# Patient Record
Sex: Female | Born: 1980 | Race: Black or African American | Hispanic: No | Marital: Married
Health system: Southern US, Community
[De-identification: ages and names within clinical notes are randomized; demographics above are authoritative.]

## PROBLEM LIST (undated history)

## (undated) ENCOUNTER — Inpatient Hospital Stay (HOSPITAL_COMMUNITY): Payer: Self-pay

## (undated) DIAGNOSIS — F329 Major depressive disorder, single episode, unspecified: Secondary | ICD-10-CM

## (undated) DIAGNOSIS — F1210 Cannabis abuse, uncomplicated: Secondary | ICD-10-CM

## (undated) DIAGNOSIS — J45909 Unspecified asthma, uncomplicated: Secondary | ICD-10-CM

## (undated) DIAGNOSIS — I10 Essential (primary) hypertension: Secondary | ICD-10-CM

## (undated) DIAGNOSIS — F1410 Cocaine abuse, uncomplicated: Secondary | ICD-10-CM

## (undated) DIAGNOSIS — O99340 Other mental disorders complicating pregnancy, unspecified trimester: Secondary | ICD-10-CM

## (undated) DIAGNOSIS — J209 Acute bronchitis, unspecified: Secondary | ICD-10-CM

## (undated) DIAGNOSIS — F32A Depression, unspecified: Secondary | ICD-10-CM

## (undated) HISTORY — PX: APPENDECTOMY: SHX54

## (undated) HISTORY — PX: HAND SURGERY: SHX662

---
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
## 2007-12-06 ENCOUNTER — Emergency Department: Payer: Self-pay | Admitting: Emergency Medicine

## 2007-12-29 ENCOUNTER — Emergency Department (HOSPITAL_COMMUNITY): Admission: EM | Admit: 2007-12-29 | Discharge: 2007-12-29 | Payer: Self-pay | Admitting: Emergency Medicine

## 2008-02-19 ENCOUNTER — Emergency Department (HOSPITAL_COMMUNITY): Admission: EM | Admit: 2008-02-19 | Discharge: 2008-02-19 | Payer: Self-pay | Admitting: Emergency Medicine

## 2008-02-26 ENCOUNTER — Emergency Department (HOSPITAL_COMMUNITY): Admission: EM | Admit: 2008-02-26 | Discharge: 2008-02-26 | Payer: Self-pay | Admitting: Emergency Medicine

## 2009-08-21 ENCOUNTER — Emergency Department (HOSPITAL_COMMUNITY): Admission: EM | Admit: 2009-08-21 | Discharge: 2009-08-21 | Payer: Self-pay | Admitting: Emergency Medicine

## 2009-09-26 ENCOUNTER — Emergency Department (HOSPITAL_COMMUNITY): Admission: EM | Admit: 2009-09-26 | Discharge: 2009-09-26 | Payer: Self-pay | Admitting: Emergency Medicine

## 2009-09-26 ENCOUNTER — Encounter: Payer: Self-pay | Admitting: Family Medicine

## 2009-12-17 ENCOUNTER — Emergency Department (HOSPITAL_COMMUNITY): Admission: EM | Admit: 2009-12-17 | Discharge: 2009-12-17 | Payer: Self-pay | Admitting: Emergency Medicine

## 2010-01-12 ENCOUNTER — Emergency Department (HOSPITAL_COMMUNITY)
Admission: EM | Admit: 2010-01-12 | Discharge: 2010-01-13 | Payer: Self-pay | Source: Home / Self Care | Admitting: Emergency Medicine

## 2010-04-01 ENCOUNTER — Emergency Department (HOSPITAL_COMMUNITY)
Admission: EM | Admit: 2010-04-01 | Discharge: 2010-04-01 | Disposition: A | Discharge status: Home / Self Care | Payer: Self-pay | Attending: Emergency Medicine | Admitting: Emergency Medicine

## 2010-04-01 DIAGNOSIS — J45909 Unspecified asthma, uncomplicated: Secondary | ICD-10-CM | POA: Insufficient documentation

## 2010-04-01 DIAGNOSIS — H5789 Other specified disorders of eye and adnexa: Secondary | ICD-10-CM | POA: Insufficient documentation

## 2010-04-01 DIAGNOSIS — H538 Other visual disturbances: Secondary | ICD-10-CM | POA: Insufficient documentation

## 2010-04-01 DIAGNOSIS — H109 Unspecified conjunctivitis: Secondary | ICD-10-CM | POA: Insufficient documentation

## 2010-04-17 LAB — POCT CARDIAC MARKERS
CKMB, poc: <1 ng/mL — ABNORMAL LOW (ref 1.0–8.0)
CKMB, poc: <1 ng/mL — ABNORMAL LOW (ref 1.0–8.0)
Myoglobin, poc: 39.8 ng/mL (ref 12–200)

## 2010-04-17 LAB — URINE MICROSCOPIC-ADD ON

## 2010-04-17 LAB — URINALYSIS, ROUTINE W REFLEX MICROSCOPIC
Glucose, UA: NEGATIVE mg/dL
Hgb urine dipstick: NEGATIVE
Ketones, ur: 15 mg/dL — AB
Nitrite: NEGATIVE
Protein, ur: 30 mg/dL — AB
Urobilinogen, UA: 0.2 mg/dL (ref 0.0–1.0)

## 2010-04-17 LAB — RAPID URINE DRUG SCREEN, HOSP PERFORMED: Tetrahydrocannabinol: POSITIVE — AB

## 2010-04-17 LAB — CBC
Hemoglobin: 11.3 g/dL — ABNORMAL LOW (ref 12.0–15.0)
MCH: 30.7 pg (ref 26.0–34.0)
MCHC: 32.4 g/dL (ref 30.0–36.0)
Platelets: 233 10*3/uL (ref 150–400)
RDW: 13.1 % (ref 11.5–15.5)

## 2010-04-17 LAB — DIFFERENTIAL
Basophils Absolute: 0 10*3/uL (ref 0.0–0.1)
Basophils Relative: 0 % (ref 0–1)
Eosinophils Absolute: 0.1 10*3/uL (ref 0.0–0.7)
Eosinophils Relative: 1 % (ref 0–5)
Monocytes Absolute: 0.4 10*3/uL (ref 0.1–1.0)

## 2010-04-17 LAB — BASIC METABOLIC PANEL
Calcium: 8.8 mg/dL (ref 8.4–10.5)
Chloride: 108 mEq/L (ref 96–112)
GFR calc Af Amer: >60 mL/min (ref >60)
GFR calc non Af Amer: >60 mL/min (ref >60)
Glucose, Bld: 147 mg/dL — ABNORMAL HIGH (ref 70–99)
Potassium: 3.4 mEq/L — ABNORMAL LOW (ref 3.5–5.1)
Sodium: 140 mEq/L (ref 135–145)

## 2010-04-17 LAB — POCT PREGNANCY, URINE: Preg Test, Ur: NEGATIVE

## 2010-04-17 LAB — PREGNANCY, URINE: Preg Test, Ur: NEGATIVE

## 2010-04-20 LAB — CBC
HCT: 36.1 % (ref 36.0–46.0)
Hemoglobin: 12.1 g/dL (ref 12.0–15.0)
MCH: 32 pg (ref 26.0–34.0)
MCHC: 33.7 g/dL (ref 30.0–36.0)
RDW: 12.8 % (ref 11.5–15.5)

## 2010-04-20 LAB — RAPID URINE DRUG SCREEN, HOSP PERFORMED
Benzodiazepines: NOT DETECTED
Opiates: NOT DETECTED

## 2010-04-20 LAB — URINALYSIS, ROUTINE W REFLEX MICROSCOPIC
Bilirubin Urine: NEGATIVE
Hgb urine dipstick: NEGATIVE
Ketones, ur: NEGATIVE mg/dL
Nitrite: NEGATIVE
Protein, ur: NEGATIVE mg/dL
Urobilinogen, UA: 0.2 mg/dL (ref 0.0–1.0)

## 2010-04-20 LAB — ABO/RH: ABO/RH(D): O POS

## 2010-05-21 LAB — DIFFERENTIAL
Basophils Absolute: 0.1 10*3/uL (ref 0.0–0.1)
Basophils Relative: 2 % — ABNORMAL HIGH (ref 0–1)
Eosinophils Absolute: 0.2 10*3/uL (ref 0.0–0.7)
Neutro Abs: 5 10*3/uL (ref 1.7–7.7)
Neutrophils Relative %: 63 % (ref 43–77)

## 2010-05-21 LAB — PREGNANCY, URINE: Preg Test, Ur: NEGATIVE

## 2010-05-21 LAB — URINALYSIS, ROUTINE W REFLEX MICROSCOPIC
Bilirubin Urine: NEGATIVE
Ketones, ur: 15 mg/dL — AB
Nitrite: NEGATIVE
Protein, ur: NEGATIVE mg/dL

## 2010-05-21 LAB — CBC
HCT: 37.6 % (ref 36.0–46.0)
Hemoglobin: 12.5 g/dL (ref 12.0–15.0)
Platelets: 199 10*3/uL (ref 150–400)
RBC: 3.9 MIL/uL (ref 3.87–5.11)

## 2010-05-21 LAB — COMPREHENSIVE METABOLIC PANEL
ALT: 13 U/L (ref 0–35)
Alkaline Phosphatase: 50 U/L (ref 39–117)
BUN: 8 mg/dL (ref 6–23)
CO2: 27 mEq/L (ref 19–32)
Chloride: 107 mEq/L (ref 96–112)
GFR calc non Af Amer: >60 mL/min (ref >60)
Glucose, Bld: 80 mg/dL (ref 70–99)
Potassium: 3.4 mEq/L — ABNORMAL LOW (ref 3.5–5.1)
Total Bilirubin: 0.3 mg/dL (ref 0.3–1.2)
Total Protein: 6.6 g/dL (ref 6.0–8.3)

## 2010-05-21 LAB — URINE MICROSCOPIC-ADD ON

## 2010-05-21 LAB — RAPID URINE DRUG SCREEN, HOSP PERFORMED
Amphetamines: NOT DETECTED
Cocaine: POSITIVE — AB
Opiates: NOT DETECTED
Tetrahydrocannabinol: POSITIVE — AB

## 2010-05-21 LAB — TYPE AND SCREEN
ABO/RH(D): O POS
Antibody Screen: NEGATIVE

## 2010-05-21 LAB — HCG, QUANTITATIVE, PREGNANCY: hCG, Beta Chain, Quant, S: <2 m[IU]/mL (ref <5)

## 2010-05-21 LAB — POCT PREGNANCY, URINE: Preg Test, Ur: NEGATIVE

## 2010-05-21 LAB — ABO/RH: ABO/RH(D): O POS

## 2010-05-21 LAB — LIPASE, BLOOD: Lipase: 19 U/L (ref 11–59)

## 2010-05-28 ENCOUNTER — Emergency Department (HOSPITAL_COMMUNITY)
Admission: EM | Admit: 2010-05-28 | Discharge: 2010-05-28 | Disposition: A | Discharge status: Home / Self Care | Payer: Self-pay | Attending: Emergency Medicine | Admitting: Emergency Medicine

## 2010-05-28 DIAGNOSIS — R5381 Other malaise: Secondary | ICD-10-CM | POA: Insufficient documentation

## 2010-05-28 DIAGNOSIS — J9801 Acute bronchospasm: Secondary | ICD-10-CM | POA: Insufficient documentation

## 2010-05-28 DIAGNOSIS — R42 Dizziness and giddiness: Secondary | ICD-10-CM | POA: Insufficient documentation

## 2010-05-28 DIAGNOSIS — F1210 Cannabis abuse, uncomplicated: Secondary | ICD-10-CM | POA: Insufficient documentation

## 2010-05-28 DIAGNOSIS — R5383 Other fatigue: Secondary | ICD-10-CM | POA: Insufficient documentation

## 2010-11-06 LAB — DIFFERENTIAL
Basophils Absolute: 0
Basophils Relative: 1
Eosinophils Absolute: 0.2
Eosinophils Relative: 3
Monocytes Absolute: 0.6
Monocytes Relative: 10

## 2010-11-06 LAB — COMPREHENSIVE METABOLIC PANEL
ALT: 12
Albumin: 3.7
Alkaline Phosphatase: 53
Chloride: 107
Potassium: 3.7
Sodium: 140
Total Bilirubin: 0.7
Total Protein: 6.4

## 2010-11-06 LAB — CBC
HCT: 36.5
Hemoglobin: 12.1
Platelets: 228
RDW: 13.4
WBC: 6.7

## 2011-01-27 ENCOUNTER — Emergency Department (HOSPITAL_COMMUNITY)
Admission: EM | Admit: 2011-01-27 | Discharge: 2011-01-28 | Disposition: A | Discharge status: Home / Self Care | Payer: Self-pay | Attending: Emergency Medicine | Admitting: Emergency Medicine

## 2011-01-27 ENCOUNTER — Encounter: Payer: Self-pay | Admitting: *Deleted

## 2011-01-27 DIAGNOSIS — R059 Cough, unspecified: Secondary | ICD-10-CM | POA: Insufficient documentation

## 2011-01-27 DIAGNOSIS — R220 Localized swelling, mass and lump, head: Secondary | ICD-10-CM | POA: Insufficient documentation

## 2011-01-27 DIAGNOSIS — J40 Bronchitis, not specified as acute or chronic: Secondary | ICD-10-CM | POA: Insufficient documentation

## 2011-01-27 DIAGNOSIS — R05 Cough: Secondary | ICD-10-CM

## 2011-01-27 DIAGNOSIS — L03818 Cellulitis of other sites: Secondary | ICD-10-CM | POA: Insufficient documentation

## 2011-01-27 DIAGNOSIS — K602 Anal fissure, unspecified: Secondary | ICD-10-CM | POA: Insufficient documentation

## 2011-01-27 DIAGNOSIS — L02818 Cutaneous abscess of other sites: Secondary | ICD-10-CM | POA: Insufficient documentation

## 2011-01-27 DIAGNOSIS — K921 Melena: Secondary | ICD-10-CM | POA: Insufficient documentation

## 2011-01-27 DIAGNOSIS — K644 Residual hemorrhoidal skin tags: Secondary | ICD-10-CM | POA: Insufficient documentation

## 2011-01-27 DIAGNOSIS — L03811 Cellulitis of head [any part, except face]: Secondary | ICD-10-CM

## 2011-01-27 DIAGNOSIS — K625 Hemorrhage of anus and rectum: Secondary | ICD-10-CM | POA: Insufficient documentation

## 2011-01-27 HISTORY — DX: Acute bronchitis, unspecified: J20.9

## 2011-01-27 NOTE — ED Notes (Signed)
C/o abd pain, rectal bleeding, also coughing up blood & mucus. Also HA ( has a knot on L parietal head), also vision blurry. Describes bleeding as bright red. Alert, NAd calm interactive, speaking in clear complete sentences.  (denies nvd). Also reports intermittant fever.

## 2011-01-27 NOTE — ED Notes (Signed)
Hemoccult card  at bedside 

## 2011-01-27 NOTE — ED Notes (Addendum)
Pt states she think she could be pregnant.  LMP 11 December 2010 and took a pregnancy test and said it was positive.
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
## 2011-01-28 ENCOUNTER — Emergency Department (HOSPITAL_COMMUNITY): Payer: Self-pay

## 2011-01-28 LAB — DIFFERENTIAL
Basophils Relative: 0 % (ref 0–1)
Eosinophils Absolute: 0.2 10*3/uL (ref 0.0–0.7)
Eosinophils Relative: 2 % (ref 0–5)
Lymphs Abs: 3.1 10*3/uL (ref 0.7–4.0)

## 2011-01-28 LAB — URINALYSIS, ROUTINE W REFLEX MICROSCOPIC
Bilirubin Urine: NEGATIVE
Hgb urine dipstick: NEGATIVE
Protein, ur: NEGATIVE mg/dL
Urobilinogen, UA: 1 mg/dL (ref 0.0–1.0)

## 2011-01-28 LAB — CBC
MCH: 31.6 pg (ref 26.0–34.0)
MCHC: 33.1 g/dL (ref 30.0–36.0)
MCV: 95.3 fL (ref 78.0–100.0)
Platelets: 218 10*3/uL (ref 150–400)
RBC: 3.61 MIL/uL — ABNORMAL LOW (ref 3.87–5.11)
RDW: 13.1 % (ref 11.5–15.5)

## 2011-01-28 LAB — POCT PREGNANCY, URINE: Preg Test, Ur: NEGATIVE

## 2011-01-28 LAB — BASIC METABOLIC PANEL
Calcium: 8.9 mg/dL (ref 8.4–10.5)
GFR calc Af Amer: >90 mL/min (ref >90)
GFR calc non Af Amer: 81 mL/min — ABNORMAL LOW (ref >90)
Glucose, Bld: 97 mg/dL (ref 70–99)
Sodium: 139 mEq/L (ref 135–145)

## 2011-01-28 MED ORDER — ALBUTEROL SULFATE HFA 108 (90 BASE) MCG/ACT IN AERS
2.0000 | INHALATION_SPRAY | RESPIRATORY_TRACT | Status: AC
  Administered 2011-01-28: 2 via RESPIRATORY_TRACT
  Filled 2011-01-28: qty 6.7

## 2011-01-28 MED ORDER — DOXYCYCLINE HYCLATE 100 MG PO TABS
100.0000 mg | ORAL_TABLET | Freq: Once | ORAL | Status: AC
  Administered 2011-01-28: 100 mg via ORAL
  Filled 2011-01-28: qty 1

## 2011-01-28 MED ORDER — DOXYCYCLINE HYCLATE 100 MG PO TABS: 100.0000 mg | ORAL_TABLET | Freq: Two times a day (BID) | ORAL | Status: AC

## 2011-01-28 NOTE — ED Provider Notes (Signed)
History     CSN: [id number]  Arrival date & time 01/27/11  2136      Chief Complaint  Patient presents with  . Cough  . Rectal Bleeding   HPI Pt was seen at 0025.  Per pt, c/o gradual onset and persistence of constant multiple complaints for the past 5 days.  Pt states she passed a "hard" BM and saw blood on her stool several days ago.  States she has had a cough with "red streaks" in her sputum, has run out of her MDI and is requesting another.  Pt also c/o "small bump" on her left parietal scalp for the past week.  Pt is also requesting a pregnancy test due to her menses being "late" this month.  Denies fevers, no SOB/CP, no abd pain, no N/V/D, no back/flank pain, no dysuria, no vaginal bleeding/discharge.     Past Medical History  Diagnosis Date  . Bronchitis, acute     Past Surgical History  Procedure Date  . Appendectomy   . Hand surgery     Family History  Problem Relation Age of Onset  . Cancer Other     History  Substance Use Topics  . Smoking status: Current Everyday Smoker -- 1.0 packs/day  . Smokeless tobacco: Not on file  . Alcohol Use: Yes     occaisional   Review of Systems ROS: Statement: All systems negative except as marked or noted in the HPI; Constitutional: Negative for fever and chills. ; ; Eyes: Negative for eye pain, redness and discharge. ; ; ENMT: Negative for ear pain, hoarseness, nasal congestion, sinus pressure and sore throat. ; ; Cardiovascular: Negative for chest pain, palpitations, diaphoresis, dyspnea and peripheral edema. ; ; Respiratory: +cough. Negative for wheezing and stridor. ; ; Gastrointestinal: Negative for nausea, vomiting, diarrhea, abdominal pain, blood in stool, hematemesis, jaundice and +rectal bleeding. . ; ; Genitourinary: Negative for dysuria, flank pain and hematuria. ; ; Musculoskeletal: Negative for back pain and neck pain. Negative for swelling and trauma.; ; Skin: Negative for pruritus, rash, abrasions, blisters, bruising  and +skin lesion.; ; Neuro: Negative for headache, lightheadedness and neck stiffness. Negative for weakness, altered level of consciousness , altered mental status, extremity weakness, paresthesias, involuntary movement, seizure and syncope.    Allergies  Penicillins  Home Medications  No current outpatient prescriptions on file.  BP 123/73  Pulse 74  Temp(Src) 98.3 F (36.8 C) (Oral)  Resp 18  SpO2 100%  LMP 12/11/2010  Physical Exam 0030: Physical examination:  Nursing notes reviewed; Vital signs and O2 SAT reviewed;  Constitutional: Well developed, Well nourished, Well hydrated, In no acute distress; Head:  Normocephalic, atraumatic, +left parietal scalp with <0.5cm area of induration, erythema and TTP, no central pointing area/drainage/open wound; Eyes: EOMI, PERRL, No scleral icterus; ENMT: Mouth and pharynx normal, Mucous membranes moist; Neck: Supple, Full range of motion, No lymphadenopathy; Cardiovascular: Regular rate and rhythm, No murmur, rub, or gallop; Respiratory: Breath sounds clear & equal bilaterally, No rales, rhonchi, wheezes, or rub, Normal respiratory effort/excursion; Chest: Nontender, Movement normal; Abdomen: Soft, Nontender, Nondistended, Normal bowel sounds; Rectal exam performed w/permission of pt and ED RN chaparone present.  Anal tone normal.  +small anal fissure and external hemorrhoid that are TTP, hemorrhoid is without thrombosis or active bleeding, no palp masses. Genitourinary: No CVA tenderness;  Extremities: Pulses normal, No tenderness, No edema, No calf edema or asymmetry.; Neuro: AA&Ox3, Major CN grossly intact. Speech clear, no facial droop, gait steady. No gross focal  motor or sensory deficits in extremities.; Skin: Color normal, Warm, Dry, no rash.   ED Course  Procedures    MDM  MDM Reviewed: nursing note and vitals Interpretation: labs and x-ray   Results for orders placed during the hospital encounter of 01/27/11  CBC      Component Value  Range   WBC 7.2  4.0 - 10.5 (K/uL)   RBC 3.61 (*) 3.87 - 5.11 (MIL/uL)   Hemoglobin 11.4 (*) 12.0 - 15.0 (g/dL)   HCT 40.9 (*) 81.1 - 46.0 (%)   MCV 95.3  78.0 - 100.0 (fL)   MCH 31.6  26.0 - 34.0 (pg)   MCHC 33.1  30.0 - 36.0 (g/dL)   RDW 91.4  78.2 - 95.6 (%)   Platelets 218  150 - 400 (K/uL)  DIFFERENTIAL      Component Value Range   Neutrophils Relative 47  43 - 77 (%)   Neutro Abs 3.4  1.7 - 7.7 (K/uL)   Lymphocytes Relative 43  12 - 46 (%)   Lymphs Abs 3.1  0.7 - 4.0 (K/uL)   Monocytes Relative 7  3 - 12 (%)   Monocytes Absolute 0.5  0.1 - 1.0 (K/uL)   Eosinophils Relative 2  0 - 5 (%)   Eosinophils Absolute 0.2  0.0 - 0.7 (K/uL)   Basophils Relative 0  0 - 1 (%)   Basophils Absolute 0.0  0.0 - 0.1 (K/uL)  BASIC METABOLIC PANEL      Component Value Range   Sodium 139  135 - 145 (mEq/L)   Potassium 3.8  3.5 - 5.1 (mEq/L)   Chloride 105  96 - 112 (mEq/L)   CO2 26  19 - 32 (mEq/L)   Glucose, Bld 97  70 - 99 (mg/dL)   BUN 19  6 - 23 (mg/dL)   Creatinine, Ser 2.13  0.50 - 1.10 (mg/dL)   Calcium 8.9  8.4 - 08.6 (mg/dL)   GFR calc non Af Amer 81 (*) >90 (mL/min)   GFR calc Af Amer >90  >90 (mL/min)  URINALYSIS, ROUTINE W REFLEX MICROSCOPIC      Component Value Range   Color, Urine YELLOW  YELLOW    APPearance CLEAR  CLEAR    Specific Gravity, Urine 1.021  1.005 - 1.030    pH 7.0  5.0 - 8.0    Glucose, UA NEGATIVE  NEGATIVE (mg/dL)   Hgb urine dipstick NEGATIVE  NEGATIVE    Bilirubin Urine NEGATIVE  NEGATIVE    Ketones, ur NEGATIVE  NEGATIVE (mg/dL)   Protein, ur NEGATIVE  NEGATIVE (mg/dL)   Urobilinogen, UA 1.0  0.0 - 1.0 (mg/dL)   Nitrite NEGATIVE  NEGATIVE    Leukocytes, UA NEGATIVE  NEGATIVE   POCT PREGNANCY, URINE      Component Value Range   Preg Test, Ur NEGATIVE     Results for CHRISTOL, THETFORD (MRN [id number]) as of 01/28/2011 02:17  Ref. Range 01/12/2010 20:54 01/27/2011 23:45  HGB Latest Range: 12.0-15.0 g/dL 52.8 (L) 41.3 (L)  HCT Latest Range:  36.0-46.0 % 34.9 (L) 34.4 (L)    Dg Chest 2 View 01/28/2011  *RADIOLOGY REPORT*  Clinical Data: Cough and body aches; abdominal pain.  CHEST - 2 VIEW  Comparison: Chest radiograph performed 01/12/2010  Findings: The lungs are well-aerated and clear.  There is no evidence of focal opacification, pleural effusion or pneumothorax.  The heart is normal in size; the mediastinal contour is within normal limits.  No acute osseous  abnormalities are seen.  IMPRESSION: No acute cardiopulmonary process seen.  Original Report Authenticated By: Tonia Ghent, M.D.      2:16 AM:  Has walked around ED without distress.  VS remain stable.  Wants to go home now.  Dx testing d/w pt and family.  Questions answered.  Verb understanding, agreeable to d/c home with outpt f/u.     Jasman Murri Allison Quarry, DO 01/30/11 [id number]

## 2011-03-09 ENCOUNTER — Emergency Department (HOSPITAL_COMMUNITY): Payer: Self-pay

## 2011-03-09 ENCOUNTER — Encounter (HOSPITAL_COMMUNITY): Payer: Self-pay | Admitting: Emergency Medicine

## 2011-03-09 ENCOUNTER — Emergency Department (HOSPITAL_COMMUNITY)
Admission: EM | Admit: 2011-03-09 | Discharge: 2011-03-09 | Disposition: A | Discharge status: Home / Self Care | Payer: Self-pay | Attending: Emergency Medicine | Admitting: Emergency Medicine

## 2011-03-09 DIAGNOSIS — R630 Anorexia: Secondary | ICD-10-CM | POA: Insufficient documentation

## 2011-03-09 DIAGNOSIS — R10819 Abdominal tenderness, unspecified site: Secondary | ICD-10-CM | POA: Insufficient documentation

## 2011-03-09 DIAGNOSIS — R61 Generalized hyperhidrosis: Secondary | ICD-10-CM | POA: Insufficient documentation

## 2011-03-09 DIAGNOSIS — R102 Pelvic and perineal pain: Secondary | ICD-10-CM

## 2011-03-09 DIAGNOSIS — F17200 Nicotine dependence, unspecified, uncomplicated: Secondary | ICD-10-CM | POA: Insufficient documentation

## 2011-03-09 DIAGNOSIS — N949 Unspecified condition associated with female genital organs and menstrual cycle: Secondary | ICD-10-CM | POA: Insufficient documentation

## 2011-03-09 DIAGNOSIS — Z0000 Encounter for general adult medical examination without abnormal findings: Secondary | ICD-10-CM

## 2011-03-09 DIAGNOSIS — R42 Dizziness and giddiness: Secondary | ICD-10-CM | POA: Insufficient documentation

## 2011-03-09 DIAGNOSIS — R3919 Other difficulties with micturition: Secondary | ICD-10-CM | POA: Insufficient documentation

## 2011-03-09 DIAGNOSIS — N898 Other specified noninflammatory disorders of vagina: Secondary | ICD-10-CM | POA: Insufficient documentation

## 2011-03-09 DIAGNOSIS — R112 Nausea with vomiting, unspecified: Secondary | ICD-10-CM | POA: Insufficient documentation

## 2011-03-09 DIAGNOSIS — R109 Unspecified abdominal pain: Secondary | ICD-10-CM | POA: Insufficient documentation

## 2011-03-09 DIAGNOSIS — R5381 Other malaise: Secondary | ICD-10-CM | POA: Insufficient documentation

## 2011-03-09 DIAGNOSIS — R5383 Other fatigue: Secondary | ICD-10-CM | POA: Insufficient documentation

## 2011-03-09 LAB — URINALYSIS, ROUTINE W REFLEX MICROSCOPIC
Glucose, UA: NEGATIVE mg/dL
Hgb urine dipstick: NEGATIVE
Leukocytes, UA: NEGATIVE
pH: 5.5 (ref 5.0–8.0)

## 2011-03-09 LAB — DIFFERENTIAL
Basophils Absolute: 0 10*3/uL (ref 0.0–0.1)
Basophils Relative: 0 % (ref 0–1)
Eosinophils Absolute: 0.2 10*3/uL (ref 0.0–0.7)
Monocytes Relative: 9 % (ref 3–12)
Neutro Abs: 3.7 10*3/uL (ref 1.7–7.7)
Neutrophils Relative %: 55 % (ref 43–77)

## 2011-03-09 LAB — COMPREHENSIVE METABOLIC PANEL
AST: 16 U/L (ref 0–37)
Albumin: 3.7 g/dL (ref 3.5–5.2)
Alkaline Phosphatase: 53 U/L (ref 39–117)
BUN: 8 mg/dL (ref 6–23)
Chloride: 105 mEq/L (ref 96–112)
Potassium: 3.5 mEq/L (ref 3.5–5.1)
Total Bilirubin: 0.4 mg/dL (ref 0.3–1.2)
Total Protein: 7.2 g/dL (ref 6.0–8.3)

## 2011-03-09 LAB — WET PREP, GENITAL
Trich, Wet Prep: NONE SEEN
WBC, Wet Prep HPF POC: NONE SEEN

## 2011-03-09 LAB — LIPASE, BLOOD: Lipase: 19 U/L (ref 11–59)

## 2011-03-09 LAB — CBC
MCH: 31.6 pg (ref 26.0–34.0)
MCHC: 33.5 g/dL (ref 30.0–36.0)
Platelets: 207 10*3/uL (ref 150–400)
RDW: 12.5 % (ref 11.5–15.5)

## 2011-03-09 MED ORDER — ONDANSETRON HCL 4 MG/2ML IJ SOLN
4.0000 mg | Freq: Once | INTRAMUSCULAR | Status: AC
  Administered 2011-03-09: 4 mg via INTRAVENOUS
  Filled 2011-03-09: qty 2

## 2011-03-09 MED ORDER — FENTANYL CITRATE 0.05 MG/ML IJ SOLN
100.0000 ug | Freq: Once | INTRAMUSCULAR | Status: AC
  Administered 2011-03-09: 100 ug via INTRAVENOUS
  Filled 2011-03-09: qty 2

## 2011-03-09 NOTE — ED Notes (Signed)
Reports lmp was 3 months ago, thinks she is having a miscarriage due to abd cramping and passing large clots this am and now feeling dizzy. No acute distress noted at triage.

## 2011-03-09 NOTE — ED Provider Notes (Signed)
History     CSN: [id number]  Arrival date & time 03/09/11  1502   First MD Initiated Contact with Patient 03/09/11 1539      Chief Complaint  Patient presents with  . Vaginal Bleeding    (Consider location/radiation/quality/duration/timing/severity/associated sxs/prior treatment) HPI Comments: Patient presents with multiple complaints including heavy vaginal bleeding that started today. Patient states that her last menstrual period was in October. Patient was seen in the emergency department on 01/27/2011 and had a negative urine pregnancy test at that time. Patient states that she has used several pads over the past several hours. Along with this the patient complains of abdominal pain, mostly in the upper quadrants and on the sides of her abdomen. She is not having any pelvic pain. Patient states that she feels dizzy and lightheaded and that she had a syncopal episode a couple hours ago. She states that she has vomited 4 times. The vomit was green. Patient states loss of appetite. She has not had dysuria but she also states that she is unable to use the restroom. No treatments prior to arrival. Patient denies using any daily medications. Patient has a history of an appendectomy.  Patient is a 31 y.o. female presenting with vaginal bleeding. The history is provided by the patient.  Vaginal Bleeding This is a new problem. The current episode started today. The problem has been gradually improving. Associated symptoms include abdominal pain, diaphoresis, fatigue, nausea and vomiting. Pertinent negatives include no chest pain, coughing, fever, headaches, myalgias, rash or sore throat. The symptoms are aggravated by nothing. She has tried nothing for the symptoms.    Past Medical History  Diagnosis Date  . Bronchitis, acute     Past Surgical History  Procedure Date  . Appendectomy   . Hand surgery     Family History  Problem Relation Age of Onset  . Cancer Other     History    Substance Use Topics  . Smoking status: Current Everyday Smoker -- 1.0 packs/day  . Smokeless tobacco: Not on file  . Alcohol Use: Yes     occaisional    OB History    Grav Para Term Preterm Abortions TAB SAB Ect Mult Living                  Review of Systems  Constitutional: Positive for diaphoresis and fatigue. Negative for fever.  HENT: Negative for sore throat and rhinorrhea.   Eyes: Negative for redness.  Respiratory: Negative for cough and shortness of breath.   Cardiovascular: Negative for chest pain.  Gastrointestinal: Positive for nausea, vomiting and abdominal pain. Negative for diarrhea and blood in stool.  Genitourinary: Positive for flank pain, vaginal bleeding and difficulty urinating. Negative for dysuria, frequency, hematuria, vaginal discharge and pelvic pain.  Musculoskeletal: Negative for myalgias and back pain.  Skin: Negative for rash.  Neurological: Negative for headaches.    Allergies  Penicillins  Home Medications   Current Outpatient Rx  Name Route Sig Dispense Refill  . ALBUTEROL SULFATE HFA 108 (90 BASE) MCG/ACT IN AERS Inhalation Inhale 2 puffs into the lungs every 6 (six) hours as needed. For shortness of breath      BP 137/85  Pulse 71  Temp(Src) 98.3 F (36.8 C) (Oral)  Resp 18  SpO2 100%  LMP 12/07/2010  Physical Exam  Nursing note and vitals reviewed. Constitutional: She is oriented to person, place, and time. She appears well-developed and well-nourished.  HENT:  Head: Normocephalic and atraumatic.  Right  Ear: External ear normal.  Left Ear: External ear normal.  Nose: Nose normal.  Mouth/Throat: Oropharynx is clear and moist.  Eyes: Conjunctivae are normal. Pupils are equal, round, and reactive to light. Right eye exhibits no discharge. Left eye exhibits no discharge.       Conjunctiva are not pale.   Neck: Normal range of motion. Neck supple.  Cardiovascular: Normal rate, regular rhythm, normal heart sounds and intact  distal pulses.   Pulmonary/Chest: Effort normal and breath sounds normal.  Abdominal: Soft. Bowel sounds are normal. She exhibits no distension. There is tenderness in the right upper quadrant, epigastric area, periumbilical area and left upper quadrant. There is no rebound, no guarding and no CVA tenderness.    Genitourinary: Vagina normal. Pelvic exam was performed with patient supine. There is no rash or lesion on the right labia. There is no rash or lesion on the left labia. Uterus is tender. Cervix exhibits discharge. Right adnexum displays tenderness. Right adnexum displays no mass and no fullness. Left adnexum displays tenderness. Left adnexum displays no mass and no fullness.  Musculoskeletal: Normal range of motion.  Neurological: She is alert and oriented to person, place, and time.  Skin: Skin is warm and dry. No pallor.  Psychiatric: She has a normal mood and affect.    ED Course  Procedures (including critical care time)  Labs Reviewed  COMPREHENSIVE METABOLIC PANEL - Abnormal; Notable for the following:    GFR calc non Af Amer 74 (*)    GFR calc Af Amer 86 (*)    All other components within normal limits  WET PREP, GENITAL - Abnormal; Notable for the following:    Clue Cells Wet Prep HPF POC FEW (*)    All other components within normal limits  CBC  DIFFERENTIAL  URINALYSIS, ROUTINE W REFLEX MICROSCOPIC  LIPASE, BLOOD  POCT PREGNANCY, URINE  GC/CHLAMYDIA PROBE AMP, GENITAL   US Transvaginal Non-ob  03/09/2011  *RADIOLOGY REPORT*  Clinical Data: Question miscarriage.  Pelvic pain.  Bleeding. A urine pregnancy test was negative.  Clear cells are present on the white prep.  TRANSABDOMINAL AND TRANSVAGINAL ULTRASOUND OF PELVIS Technique:  Both transabdominal and transvaginal ultrasound examinations of the pelvis were performed. Transabdominal technique was performed for global imaging of the pelvis including uterus, ovaries, adnexal regions, and pelvic cul-de-sac.  Comparison:  Pelvic ultrasound 09/26/2009.   It was necessary to proceed with endovaginal exam following the transabdominal exam to visualize the uterus and adnexa.  Findings:  Uterus: The uterus is of normal size and echotexture, measuring 7.7 x 3.9 x 4.7 cm.  There is no gestational sac.  Endometrium: Normal.  Maximal thickness is 7.2 mm.  Right ovary:  The right ovary is of normal size and echotexture, measuring 4.5 x 3.0-0.7 cm.  Multiple follicles are evident.  Left ovary: The left ovary is of normal size and echotexture measuring 4.8 x 2.7 x 4.0 cm.  Multiple follicles are evident.  Other findings: No free fluid  IMPRESSION: Normal pelvic ultrasound.  Original Report Authenticated By: Jamesetta Orleans. MATTERN, M.D.   US Pelvis Complete  03/09/2011  *RADIOLOGY REPORT*  Clinical Data: Question miscarriage.  Pelvic pain.  Bleeding. A urine pregnancy test was negative.  Clear cells are present on the white prep.  TRANSABDOMINAL AND TRANSVAGINAL ULTRASOUND OF PELVIS Technique:  Both transabdominal and transvaginal ultrasound examinations of the pelvis were performed. Transabdominal technique was performed for global imaging of the pelvis including uterus, ovaries, adnexal regions, and pelvic cul-de-sac.  Comparison: Pelvic ultrasound 09/26/2009.   It was necessary to proceed with endovaginal exam following the transabdominal exam to visualize the uterus and adnexa.  Findings:  Uterus: The uterus is of normal size and echotexture, measuring 7.7 x 3.9 x 4.7 cm.  There is no gestational sac.  Endometrium: Normal.  Maximal thickness is 7.2 mm.  Right ovary:  The right ovary is of normal size and echotexture, measuring 4.5 x 3.0-0.7 cm.  Multiple follicles are evident.  Left ovary: The left ovary is of normal size and echotexture measuring 4.8 x 2.7 x 4.0 cm.  Multiple follicles are evident.  Other findings: No free fluid  IMPRESSION: Normal pelvic ultrasound.  Original Report Authenticated By: Jamesetta Orleans. MATTERN, M.D.      1. Pelvic pain   2. Normal physical exam     3:42 PM Pt not in room. Previous visit from 01/27/11 reviewed. Neg upreg at that time. \\  4:04 PM Patient seen and examined. Work-up initiated. Medications ordered.   Vital signs reviewed and are as follows: Filed Vitals:   03/09/11 1526  BP: 137/85  Pulse: 71  Temp: 98.3 F (36.8 C)  Resp: 18   Do not suspect ectopic pregnancy or other serious etiology at this time however work-up initiated.   5:22 PM Pelvic exam was performed. No evidence of bleeding. Patient has had inconsistent exam during time in ED. Pelvic pain everywhere on exam. Work-up negative to this point. Pt seen by Dr. Patrica Duel. [place] ordered.    7:51 PM Patient at [place].  9:02 PM Patient was informed of all results. She is urged to follow-up with her GYN and return with worsening. She verbalizes understanding and agrees with plan.    MDM  Patient with cc: vaginal bleeding, no evidence of bleeding on exam. Work-up negative. Exam and tests are essentially normal.         Carolee Rota, [place] 03/09/11 2104

## 2011-03-10 NOTE — ED Provider Notes (Signed)
Medical screening examination/treatment/procedure(s) were performed by non-physician practitioner and as supervising physician I was immediately available for consultation/collaboration.   Klyn Kroening A. Patrica Duel, MD 03/10/11 1108

## 2011-03-18 ENCOUNTER — Emergency Department (HOSPITAL_COMMUNITY)
Admission: EM | Admit: 2011-03-18 | Discharge: 2011-03-19 | Disposition: A | Discharge status: Home / Self Care | Payer: Self-pay | Attending: Emergency Medicine | Admitting: Emergency Medicine

## 2011-03-18 ENCOUNTER — Emergency Department (HOSPITAL_COMMUNITY): Payer: Self-pay

## 2011-03-18 ENCOUNTER — Encounter (HOSPITAL_COMMUNITY): Payer: Self-pay | Admitting: Emergency Medicine

## 2011-03-18 DIAGNOSIS — IMO0002 Reserved for concepts with insufficient information to code with codable children: Secondary | ICD-10-CM | POA: Insufficient documentation

## 2011-03-18 DIAGNOSIS — M79609 Pain in unspecified limb: Secondary | ICD-10-CM | POA: Insufficient documentation

## 2011-03-18 DIAGNOSIS — Y92009 Unspecified place in unspecified non-institutional (private) residence as the place of occurrence of the external cause: Secondary | ICD-10-CM | POA: Insufficient documentation

## 2011-03-18 DIAGNOSIS — S6000XA Contusion of unspecified finger without damage to nail, initial encounter: Secondary | ICD-10-CM | POA: Insufficient documentation

## 2011-03-18 DIAGNOSIS — F17200 Nicotine dependence, unspecified, uncomplicated: Secondary | ICD-10-CM | POA: Insufficient documentation

## 2011-03-18 DIAGNOSIS — R209 Unspecified disturbances of skin sensation: Secondary | ICD-10-CM | POA: Insufficient documentation

## 2011-03-18 MED ORDER — HYDROCODONE-ACETAMINOPHEN 5-325 MG PO TABS
1.0000 | ORAL_TABLET | Freq: Once | ORAL | Status: AC
  Administered 2011-03-18: 1 via ORAL
  Filled 2011-03-18: qty 1
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
## 2011-03-19 MED ORDER — AMOXICILLIN-POT CLAVULANATE 875-125 MG PO TABS: 1.0000 | ORAL_TABLET | Freq: Two times a day (BID) | ORAL | Status: AC

## 2011-03-19 MED ORDER — TETANUS-DIPHTH-ACELL PERTUSSIS 5-2.5-18.5 LF-MCG/0.5 IM SUSP
0.5000 mL | Freq: Once | INTRAMUSCULAR | Status: AC
  Administered 2011-03-19: 0.5 mL via INTRAMUSCULAR
  Filled 2011-03-19: qty 0.5

## 2011-03-19 MED ORDER — HYDROCODONE-ACETAMINOPHEN 5-325 MG PO TABS: 1.0000 | ORAL_TABLET | ORAL | Status: AC | PRN

## 2011-03-19 NOTE — Discharge Instructions (Signed)
Contusion A contusion is a deep bruise. Bruises happen when an injury causes bleeding under the skin. Signs of bruising include pain, puffiness (swelling), and discolored skin. The bruise may turn blue, purple, or yellow. HOME CARE   Rest the injured area until the pain and puffiness are better.   Try to limit use of the injured area as much as possible or as told by your doctor.   Put ice on the injured area.   Put ice in a plastic bag.   Place a towel between your skin and the bag.   Leave the ice on for 15 to 20 minutes, 3 to 4 times a day.   Raise (elevate) the injured area above the level of the heart.   Use an elastic bandage to lessen puffiness and motion.   Only take medicine as told by your doctor.   Eat healthy.   See your doctor for a follow-up visit.  GET HELP RIGHT AWAY IF:   There is more redness, puffiness, or pain.   You have a headache, muscle ache, or you feel dizzy and ill.   You have a fever.   The pain is not controlled with medicine.   The bruise is not getting better.   There is yellowish white fluid (pus) coming from the wound.   You lose feeling (numbness) in the injured area.   The bruised area feels cold.   There are new problems.  MAKE SURE YOU:   Understand these instructions.   Will watch your condition.   Will get help right away if you are not doing well or get worse.  Document Released: 07/10/2007 Document Revised: 10/03/2010 Document Reviewed: 07/10/2007 [employer] Patient Information 2012 [employer]. 
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
## 2011-10-11 ENCOUNTER — Emergency Department (HOSPITAL_COMMUNITY)
Admission: EM | Admit: 2011-10-11 | Discharge: 2011-10-11 | Disposition: A | Discharge status: Home / Self Care | Payer: Self-pay | Attending: Emergency Medicine | Admitting: Emergency Medicine

## 2011-10-11 ENCOUNTER — Emergency Department (HOSPITAL_COMMUNITY): Payer: Self-pay

## 2011-10-11 ENCOUNTER — Encounter (HOSPITAL_COMMUNITY): Payer: Self-pay | Admitting: Family Medicine

## 2011-10-11 DIAGNOSIS — S0990XA Unspecified injury of head, initial encounter: Secondary | ICD-10-CM | POA: Insufficient documentation

## 2011-10-11 DIAGNOSIS — Z9089 Acquired absence of other organs: Secondary | ICD-10-CM | POA: Insufficient documentation

## 2011-10-11 DIAGNOSIS — F17200 Nicotine dependence, unspecified, uncomplicated: Secondary | ICD-10-CM | POA: Insufficient documentation

## 2011-10-11 DIAGNOSIS — Y92410 Unspecified street and highway as the place of occurrence of the external cause: Secondary | ICD-10-CM | POA: Insufficient documentation

## 2011-10-11 MED ORDER — HYDROCODONE-ACETAMINOPHEN 5-325 MG PO TABS
1.0000 | ORAL_TABLET | Freq: Once | ORAL | Status: AC
  Administered 2011-10-11: 1 via ORAL
  Filled 2011-10-11: qty 1

## 2011-10-11 MED ORDER — KETOROLAC TROMETHAMINE 30 MG/ML IJ SOLN: 30.0000 mg | Freq: Once | INTRAMUSCULAR | Status: DC

## 2011-10-11 MED ORDER — HYDROCODONE-ACETAMINOPHEN 5-325 MG PO TABS
ORAL_TABLET | ORAL | Status: AC
  Filled 2011-10-11: qty 1

## 2011-10-11 MED ORDER — KETOROLAC TROMETHAMINE 30 MG/ML IJ SOLN
30.0000 mg | Freq: Once | INTRAMUSCULAR | Status: AC
  Administered 2011-10-11: 30 mg via INTRAMUSCULAR
  Filled 2011-10-11: qty 1
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
## 2012-09-29 ENCOUNTER — Encounter (HOSPITAL_COMMUNITY): Payer: Self-pay | Admitting: Emergency Medicine

## 2012-09-29 ENCOUNTER — Emergency Department (HOSPITAL_COMMUNITY)
Admission: EM | Admit: 2012-09-29 | Discharge: 2012-09-29 | Discharge status: Against Medical Advice | Payer: Self-pay | Attending: Emergency Medicine | Admitting: Emergency Medicine

## 2012-09-29 ENCOUNTER — Emergency Department (HOSPITAL_COMMUNITY): Payer: Self-pay

## 2012-09-29 DIAGNOSIS — Z79899 Other long term (current) drug therapy: Secondary | ICD-10-CM | POA: Insufficient documentation

## 2012-09-29 DIAGNOSIS — N949 Unspecified condition associated with female genital organs and menstrual cycle: Secondary | ICD-10-CM | POA: Insufficient documentation

## 2012-09-29 DIAGNOSIS — N760 Acute vaginitis: Secondary | ICD-10-CM | POA: Insufficient documentation

## 2012-09-29 DIAGNOSIS — O210 Mild hyperemesis gravidarum: Secondary | ICD-10-CM

## 2012-09-29 DIAGNOSIS — B9689 Other specified bacterial agents as the cause of diseases classified elsewhere: Secondary | ICD-10-CM

## 2012-09-29 DIAGNOSIS — Z3201 Encounter for pregnancy test, result positive: Secondary | ICD-10-CM | POA: Insufficient documentation

## 2012-09-29 DIAGNOSIS — O219 Vomiting of pregnancy, unspecified: Secondary | ICD-10-CM | POA: Insufficient documentation

## 2012-09-29 DIAGNOSIS — N72 Inflammatory disease of cervix uteri: Secondary | ICD-10-CM | POA: Insufficient documentation

## 2012-09-29 DIAGNOSIS — F1210 Cannabis abuse, uncomplicated: Secondary | ICD-10-CM | POA: Insufficient documentation

## 2012-09-29 DIAGNOSIS — Z885 Allergy status to narcotic agent status: Secondary | ICD-10-CM | POA: Insufficient documentation

## 2012-09-29 DIAGNOSIS — F17200 Nicotine dependence, unspecified, uncomplicated: Secondary | ICD-10-CM | POA: Insufficient documentation

## 2012-09-29 LAB — URINALYSIS, ROUTINE W REFLEX MICROSCOPIC
Hgb urine dipstick: NEGATIVE
Nitrite: NEGATIVE
Protein, ur: NEGATIVE mg/dL
Specific Gravity, Urine: 1.007 (ref 1.005–1.030)
Urobilinogen, UA: 0.2 mg/dL (ref 0.0–1.0)

## 2012-09-29 LAB — WET PREP, GENITAL

## 2012-09-29 LAB — POCT PREGNANCY, URINE: Preg Test, Ur: POSITIVE — AB

## 2012-09-29 LAB — GC/CHLAMYDIA PROBE AMP: CT Probe RNA: NEGATIVE

## 2012-09-29 MED ORDER — AZITHROMYCIN 1 G PO PACK
1.0000 g | PACK | Freq: Once | ORAL | Status: AC
  Administered 2012-09-29: 1 g via ORAL
  Filled 2012-09-29: qty 1

## 2012-09-29 MED ORDER — CEFTRIAXONE SODIUM 250 MG IJ SOLR
250.0000 mg | Freq: Once | INTRAMUSCULAR | Status: AC
  Administered 2012-09-29: 250 mg via INTRAMUSCULAR
  Filled 2012-09-29: qty 250

## 2012-09-29 NOTE — ED Notes (Signed)
Pt here for abd pain and nausea, recently found out she was [redacted] weeks pregnant. sts marijuana is the only thing that makes her eat but she still vomits. sts that food tastes funny. This is second pregnancy child is 32 years old.

## 2012-09-29 NOTE — ED Notes (Signed)
PT. REPORTS PERSISTENT NAUSEA AND VOMITTING FOR THE PAST SEVERAL DAYS UNRELIEVED BY PRESCRIPTION ZOFRAN , PT. STATED SHE IS [redacted] WEEKS PREGNANT.

## 2012-09-29 NOTE — ED Notes (Signed)
Pt understands risk and benefits of leaving ama, form signed

## 2012-09-29 NOTE — ED Provider Notes (Signed)
CSN: [id number]     Arrival date & time 09/29/12  0205 History   First MD Initiated Contact with Patient 09/29/12 [phone]     Chief Complaint  Patient presents with  . Emesis During Pregnancy   (Consider location/radiation/quality/duration/timing/severity/associated sxs/prior Treatment) Patient is a 32 y.o. female presenting with vomiting. The history is provided by the patient.  Emesis Severity:  Moderate Timing:  Intermittent Number of daily episodes:  3 Quality:  Stomach contents Progression:  Unchanged Chronicity:  New Recent urination:  Normal Relieved by:  Nothing Worsened by:  Nothing tried Ineffective treatments:  None tried Associated symptoms: no arthralgias, no chills, no fever, no headaches and no myalgias   Associated symptoms comment:  Cramping Risk factors: pregnant now   Risk factors: no sick contacts   Risk factors comment:  See at [facility] on the 16th, see attached records and diagnosed with IUP G2P1 see at [facility] on the 16th and diagnosed with IUP and is having persistent n/v despite zofran.  No f/c/r.  No urinary symptoms some discharge  Past Medical History  Diagnosis Date  . Bronchitis, acute    Past Surgical History  Procedure Laterality Date  . Appendectomy    . Hand surgery     Family History  Problem Relation Age of Onset  . Cancer Other    History  Substance Use Topics  . Smoking status: Current Every Day Smoker -- 1.00 packs/day  . Smokeless tobacco: Not on file  . Alcohol Use: Yes     Comment: occaisional   OB History   Grav Para Term Preterm Abortions TAB SAB Ect Mult Living   1              Review of Systems  Constitutional: Negative for chills.  Gastrointestinal: Positive for nausea and vomiting.  Genitourinary: Positive for vaginal discharge and pelvic pain. Negative for vaginal bleeding.  Musculoskeletal: Negative for myalgias and arthralgias.  Neurological: Negative for headaches.  All other systems reviewed and are  negative.    Allergies  Percocet  Home Medications   Current Outpatient Rx  Name  Route  Sig  Dispense  Refill  . albuterol (PROVENTIL HFA;VENTOLIN HFA) 108 (90 BASE) MCG/ACT inhaler   Inhalation   Inhale 2 puffs into the lungs every 6 (six) hours as needed. For shortness of breath         . ondansetron (ZOFRAN) 4 MG tablet   Oral   Take 4 mg by mouth every 8 (eight) hours as needed for nausea.          BP 108/70  Pulse 59  Temp(Src) 99.1 F (37.3 C) (Oral)  SpO2 99%  LMP 09/25/2011 Physical Exam  Constitutional: She is oriented to person, place, and time. She appears well-developed and well-nourished.  HENT:  Head: Normocephalic and atraumatic.  Mouth/Throat: Oropharynx is clear and moist.  Eyes: Conjunctivae are normal. Pupils are equal, round, and reactive to light.  Neck: Normal range of motion. Neck supple.  Cardiovascular: Normal rate, regular rhythm and intact distal pulses.   Pulmonary/Chest: Effort normal and breath sounds normal. She has no wheezes. She has no rales.  Abdominal: Soft. Bowel sounds are normal. There is no tenderness. There is no rebound and no guarding.  Genitourinary: Vaginal discharge found.  cmt os closed white discharge chaperone present  Musculoskeletal: Normal range of motion.  Neurological: She is alert and oriented to person, place, and time.  Skin: Skin is warm and dry.  Psychiatric: She has a normal  mood and affect.    ED Course  Procedures (including critical care time) Labs Review Labs Reviewed  WET PREP, GENITAL - Abnormal; Notable for the following:    Yeast Wet Prep HPF POC FEW (*)    Clue Cells Wet Prep HPF POC MODERATE (*)    WBC, Wet Prep HPF POC FEW (*)    All other components within normal limits  URINALYSIS, ROUTINE W REFLEX MICROSCOPIC - Abnormal; Notable for the following:    APPearance HAZY (*)    Ketones, ur 15 (*)    All other components within normal limits  POCT PREGNANCY, URINE - Abnormal; Notable for  the following:    Preg Test, Ur POSITIVE (*)    All other components within normal limits  GC/CHLAMYDIA PROBE AMP  HCG, QUANTITATIVE, PREGNANCY   Imaging Review No results found.  MDM  See attached records normal CBC and patient is O positive.  Treated for GC/Chlamydia.  Will repeat quant.  Patient must stop using marijuana.  Will change to phenergan suppositories.     Case d/w faculty practice attending on call treat BV in first trimester   Patient then left AMA during the work up  Anginette Espejo Smitty Cords, MD 09/29/12 [phone]

## 2012-09-29 NOTE — ED Notes (Signed)
Patient transported to Ultrasound 

## 2013-12-06 ENCOUNTER — Encounter (HOSPITAL_COMMUNITY): Payer: Self-pay | Admitting: Emergency Medicine

## 2013-12-11 ENCOUNTER — Inpatient Hospital Stay (HOSPITAL_COMMUNITY)
Admission: AD | Admit: 2013-12-11 | Discharge: 2013-12-12 | Disposition: A | Discharge status: Home / Self Care | Payer: Medicaid Other | Source: Ambulatory Visit | Attending: Obstetrics and Gynecology | Admitting: Obstetrics and Gynecology

## 2013-12-11 ENCOUNTER — Encounter (HOSPITAL_COMMUNITY): Payer: Self-pay

## 2013-12-11 DIAGNOSIS — S3991XA Unspecified injury of abdomen, initial encounter: Secondary | ICD-10-CM | POA: Insufficient documentation

## 2013-12-11 DIAGNOSIS — O99323 Drug use complicating pregnancy, third trimester: Secondary | ICD-10-CM | POA: Insufficient documentation

## 2013-12-11 DIAGNOSIS — O0932 Supervision of pregnancy with insufficient antenatal care, second trimester: Secondary | ICD-10-CM

## 2013-12-11 DIAGNOSIS — Z3A27 27 weeks gestation of pregnancy: Secondary | ICD-10-CM | POA: Insufficient documentation

## 2013-12-11 DIAGNOSIS — R109 Unspecified abdominal pain: Secondary | ICD-10-CM | POA: Diagnosis not present

## 2013-12-11 DIAGNOSIS — Z3A36 36 weeks gestation of pregnancy: Secondary | ICD-10-CM | POA: Diagnosis not present

## 2013-12-11 DIAGNOSIS — F1990 Other psychoactive substance use, unspecified, uncomplicated: Secondary | ICD-10-CM | POA: Insufficient documentation

## 2013-12-11 DIAGNOSIS — O26893 Other specified pregnancy related conditions, third trimester: Secondary | ICD-10-CM | POA: Diagnosis not present

## 2013-12-11 DIAGNOSIS — O0930 Supervision of pregnancy with insufficient antenatal care, unspecified trimester: Secondary | ICD-10-CM | POA: Insufficient documentation

## 2013-12-11 HISTORY — DX: Unspecified asthma, uncomplicated: J45.909

## 2013-12-11 HISTORY — DX: Cannabis abuse, uncomplicated: F12.10

## 2013-12-11 HISTORY — DX: Essential (primary) hypertension: I10

## 2013-12-11 HISTORY — DX: Cocaine abuse, uncomplicated: F14.10

## 2013-12-11 LAB — URINALYSIS, ROUTINE W REFLEX MICROSCOPIC
BILIRUBIN URINE: NEGATIVE
Glucose, UA: NEGATIVE mg/dL
HGB URINE DIPSTICK: NEGATIVE
KETONES UR: 15 mg/dL — AB
Leukocytes, UA: NEGATIVE
NITRITE: NEGATIVE
PH: 6 (ref 5.0–8.0)
Protein, ur: 100 mg/dL — AB
Specific Gravity, Urine: >1.03 — ABNORMAL HIGH (ref 1.005–1.030)
Urobilinogen, UA: 0.2 mg/dL (ref 0.0–1.0)

## 2013-12-11 LAB — RAPID URINE DRUG SCREEN, HOSP PERFORMED
AMPHETAMINES: NOT DETECTED
BARBITURATES: NOT DETECTED
BENZODIAZEPINES: NOT DETECTED
Cocaine: POSITIVE — AB
Opiates: NOT DETECTED
TETRAHYDROCANNABINOL: POSITIVE — AB

## 2013-12-11 LAB — ETHANOL

## 2013-12-11 LAB — URINE MICROSCOPIC-ADD ON
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
## 2013-12-11 NOTE — MAU Note (Signed)
Informed pt of plan of care. Blood work drawn. Informed patient we need urine sample; pt states she wants us to "leave her alone for a while"; having difficulty keeping patient aroused in order to discuss her treatment and get information.

## 2013-12-12 ENCOUNTER — Inpatient Hospital Stay (HOSPITAL_COMMUNITY): Payer: Medicaid Other

## 2013-12-12 ENCOUNTER — Encounter (HOSPITAL_COMMUNITY): Payer: Self-pay

## 2013-12-12 DIAGNOSIS — S3991XA Unspecified injury of abdomen, initial encounter: Secondary | ICD-10-CM

## 2013-12-12 DIAGNOSIS — F1210 Cannabis abuse, uncomplicated: Secondary | ICD-10-CM

## 2013-12-12 DIAGNOSIS — O99322 Drug use complicating pregnancy, second trimester: Secondary | ICD-10-CM

## 2013-12-12 DIAGNOSIS — F1420 Cocaine dependence, uncomplicated: Secondary | ICD-10-CM

## 2013-12-12 DIAGNOSIS — Z3A27 27 weeks gestation of pregnancy: Secondary | ICD-10-CM

## 2013-12-12 NOTE — Discharge Instructions (Signed)
Preterm Labor Information Preterm labor is when labor starts at less than 37 weeks of pregnancy. The normal length of a pregnancy is 39 to 41 weeks. CAUSES Often, there is no identifiable underlying cause as to why a woman goes into preterm labor. One of the most common known causes of preterm labor is infection. Infections of the uterus, cervix, vagina, amniotic sac, bladder, kidney, or even the lungs (pneumonia) can cause labor to start. Other suspected causes of preterm labor include:   Urogenital infections, such as yeast infections and bacterial vaginosis.   Uterine abnormalities (uterine shape, uterine septum, fibroids, or bleeding from the placenta).   A cervix that has been operated on (it may fail to stay closed).   Malformations in the fetus.   Multiple gestations (twins, triplets, and so on).   Breakage of the amniotic sac.  RISK FACTORS 1. Having a previous history of preterm labor.  2. Having premature rupture of membranes (PROM).  3. Having a placenta that covers the opening of the cervix (placenta previa).  4. Having a placenta that separates from the uterus (placental abruption).  5. Having a cervix that is too weak to hold the fetus in the uterus (incompetent cervix).  6. Having too much fluid in the amniotic sac (polyhydramnios).  7. Taking illegal drugs or smoking while pregnant.  8. Not gaining enough weight while pregnant.  9. Being younger than 18 and older than 31 years old.  10. Having a low socioeconomic status.  11. Being African American. SYMPTOMS Signs and symptoms of preterm labor include:   Menstrual-like cramps, abdominal pain, or back pain.  Uterine contractions that are regular, as frequent as six in an hour, regardless of their intensity (may be mild or painful).  Contractions that start on the top of the uterus and spread down to the lower abdomen and back.   A sense of increased pelvic pressure.   A watery or bloody mucus  discharge that comes from the vagina.  TREATMENT Depending on the length of the pregnancy and other circumstances, your health care provider may suggest bed rest. If necessary, there are medicines that can be given to stop contractions and to mature the fetal lungs. If labor happens before 34 weeks of pregnancy, a prolonged hospital stay may be recommended. Treatment depends on the condition of both you and the fetus.  WHAT SHOULD YOU DO IF YOU THINK YOU ARE IN PRETERM LABOR? Call your health care provider right away. You will need to go to the hospital to get checked immediately. HOW CAN YOU PREVENT PRETERM LABOR IN FUTURE PREGNANCIES? You should:   Stop smoking if you smoke.  Maintain healthy weight gain and avoid chemicals and drugs that are not necessary.  Be watchful for any type of infection.  Inform your health care provider if you have a known history of preterm labor. Document Released: 04/13/2003 Document Revised: 09/23/2012 Document Reviewed: 02/24/2012 ExitCare Patient Information 2015 [employer]. This information is not intended to replace advice given to you by your health care provider. Make sure you discuss any questions you have with your health care provider.  Fetal Movement Counts Patient Name: __________________________________________________ Patient Due Date: ____________________ Performing a fetal movement count is highly recommended in high-risk pregnancies, but it is good for every pregnant woman to do. Your health care provider may ask you to start counting fetal movements at 28 weeks of the pregnancy. Fetal movements often increase:  After eating a full meal.  After physical activity.    After eating or drinking something sweet or cold.  At rest. Pay attention to when you feel the baby is most active. This will help you notice a pattern of your baby's sleep and wake cycles and what factors contribute to an increase in fetal movement. It is important to  perform a fetal movement count at the same time each day when your baby is normally most active.  HOW TO COUNT FETAL MOVEMENTS 12. Find a quiet and comfortable area to sit or lie down on your left side. Lying on your left side provides the best blood and oxygen circulation to your baby. 13. Write down the day and time on a sheet of paper or in a journal. 14. Start counting kicks, flutters, swishes, rolls, or jabs in a 2-hour period. You should feel at least 10 movements within 2 hours. 15. If you do not feel 10 movements in 2 hours, wait 2-3 hours and count again. Look for a change in the pattern or not enough counts in 2 hours. SEEK MEDICAL CARE IF:  You feel less than 10 counts in 2 hours, tried twice.  There is no movement in over an hour.  The pattern is changing or taking longer each day to reach 10 counts in 2 hours.  You feel the baby is not moving as he or she usually does. Date: ____________ Movements: ____________ Start time: ____________ Finish time: ____________  Date: ____________ Movements: ____________ Start time: ____________ Finish time: ____________ Date: ____________ Movements: ____________ Start time: ____________ Finish time: ____________ Date: ____________ Movements: ____________ Start time: ____________ Finish time: ____________ Date: ____________ Movements: ____________ Start time: ____________ Finish time: ____________ Date: ____________ Movements: ____________ Start time: ____________ Finish time: ____________ Date: ____________ Movements: ____________ Start time: ____________ Finish time: ____________ Date: ____________ Movements: ____________ Start time: ____________ Finish time: ____________  Date: ____________ Movements: ____________ Start time: ____________ Finish time: ____________ Date: ____________ Movements: ____________ Start time: ____________ Finish time: ____________ Date: ____________ Movements: ____________ Start time: ____________ Finish time:  ____________ Date: ____________ Movements: ____________ Start time: ____________ Finish time: ____________ Date: ____________ Movements: ____________ Start time: ____________ Finish time: ____________ Date: ____________ Movements: ____________ Start time: ____________ Finish time: ____________ Date: ____________ Movements: ____________ Start time: ____________ Finish time: ____________  Date: ____________ Movements: ____________ Start time: ____________ Finish time: ____________ Date: ____________ Movements: ____________ Start time: ____________ Finish time: ____________ Date: ____________ Movements: ____________ Start time: ____________ Finish time: ____________ Date: ____________ Movements: ____________ Start time: ____________ Finish time: ____________ Date: ____________ Movements: ____________ Start time: ____________ Finish time: ____________ Date: ____________ Movements: ____________ Start time: ____________ Finish time: ____________ Date: ____________ Movements: ____________ Start time: ____________ Finish time: ____________  Date: ____________ Movements: ____________ Start time: ____________ Finish time: ____________ Date: ____________ Movements: ____________ Start time: ____________ Finish time: ____________ Date: ____________ Movements: ____________ Start time: ____________ Finish time: ____________ Date: ____________ Movements: ____________ Start time: ____________ Finish time: ____________ Date: ____________ Movements: ____________ Start time: ____________ Finish time: ____________ Date: ____________ Movements: ____________ Start time: ____________ Finish time: ____________ Date: ____________ Movements: ____________ Start time: ____________ Finish time: ____________  Date: ____________ Movements: ____________ Start time: ____________ Finish time: ____________ Date: ____________ Movements: ____________ Start time: ____________ Finish time: ____________ Date: ____________ Movements:  ____________ Start time: ____________ Finish time: ____________ Date: ____________ Movements: ____________ Start time: ____________ Finish time: ____________ Date: ____________ Movements: ____________ Start time: ____________ Finish time: ____________ Date: ____________ Movements: ____________ Start time: ____________ Finish time: ____________ Date: ____________ Movements: ____________ Start time: ____________ Finish time: ____________    Date: ____________ Movements: ____________ Start time: ____________ Finish time: ____________ Date: ____________ Movements: ____________ Start time: ____________ Finish time: ____________ Date: ____________ Movements: ____________ Start time: ____________ Finish time: ____________ Date: ____________ Movements: ____________ Start time: ____________ Finish time: ____________ Date: ____________ Movements: ____________ Start time: ____________ Finish time: ____________ Date: ____________ Movements: ____________ Start time: ____________ Finish time: ____________ Date: ____________ Movements: ____________ Start time: ____________ Finish time: ____________  Date: ____________ Movements: ____________ Start time: ____________ Finish time: ____________ Date: ____________ Movements: ____________ Start time: ____________ Finish time: ____________ Date: ____________ Movements: ____________ Start time: ____________ Finish time: ____________ Date: ____________ Movements: ____________ Start time: ____________ Finish time: ____________ Date: ____________ Movements: ____________ Start time: ____________ Finish time: ____________ Date: ____________ Movements: ____________ Start time: ____________ Finish time: ____________ Date: ____________ Movements: ____________ Start time: ____________ Finish time: ____________  Date: ____________ Movements: ____________ Start time: ____________ Finish time: ____________ Date: ____________ Movements: ____________ Start time: ____________ Finish  time: ____________ Date: ____________ Movements: ____________ Start time: ____________ Finish time: ____________ Date: ____________ Movements: ____________ Start time: ____________ Finish time: ____________ Date: ____________ Movements: ____________ Start time: ____________ Finish time: ____________ Date: ____________ Movements: ____________ Start time: ____________ Finish time: ____________ Document Released: 02/20/2006 Document Revised: 06/07/2013 Document Reviewed: 11/18/2011 ExitCare Patient Information 2015 [employer]. This information is not intended to replace advice given to you by your health care provider. Make sure you discuss any questions you have with your health care provider.  
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
## 2013-12-13 DIAGNOSIS — O0930 Supervision of pregnancy with insufficient antenatal care, unspecified trimester: Secondary | ICD-10-CM | POA: Insufficient documentation

## 2013-12-13 DIAGNOSIS — F1990 Other psychoactive substance use, unspecified, uncomplicated: Secondary | ICD-10-CM | POA: Insufficient documentation

## 2013-12-13 DIAGNOSIS — S3991XA Unspecified injury of abdomen, initial encounter: Secondary | ICD-10-CM | POA: Insufficient documentation

## 2013-12-13 DIAGNOSIS — Z3A27 27 weeks gestation of pregnancy: Secondary | ICD-10-CM | POA: Insufficient documentation

## 2014-02-04 NOTE — L&D Delivery Note (Signed)
Delivery Note At 5:34 AM a viable female was delivered via Vaginal, Spontaneous Delivery (Presentation: Left Occiput Anterior) immediately prior to CNM arriving in room. Pt thought her water broke, but baby's head came out at the same time. Pt called RN. Baby's head was out when RN arrived in room. She delivered rest of baby.  APGAR: 7, 8; weight 7 lb 0.7 oz (3195 g).   Placenta status: Intact, Spontaneous.  Cord: 3 vessels with the following complications: 5x10 cm area of dark, adherent clot on maternal surface. Possible abruption.  Cord pH: NA  Anesthesia: Epidural  Episiotomy: None Lacerations: None Suture Repair: NA Est. Blood Loss (mL): 350  Mom to postpartum.  Baby to Couplet care / Skin to Skin. Placenta to: BS Feeding: Bottle Circ: ?OP Contraception: Desires BTL, but doesn't have medicaid.  Sign BTL consent. SW consult.  Dorathy KinsmanSMITH, Johnanna Bakke 02/23/2014, 9:32 AM

## 2014-02-22 ENCOUNTER — Encounter (HOSPITAL_COMMUNITY): Payer: Self-pay | Admitting: General Practice

## 2014-02-22 ENCOUNTER — Inpatient Hospital Stay (HOSPITAL_COMMUNITY)
Admission: AD | Admit: 2014-02-22 | Discharge: 2014-02-25 | DRG: 775 | Disposition: A | Discharge status: Home / Self Care | Payer: Medicaid Other | Source: Ambulatory Visit | Attending: Obstetrics and Gynecology | Admitting: Obstetrics and Gynecology

## 2014-02-22 ENCOUNTER — Inpatient Hospital Stay (HOSPITAL_COMMUNITY): Payer: Medicaid Other

## 2014-02-22 DIAGNOSIS — O0933 Supervision of pregnancy with insufficient antenatal care, third trimester: Secondary | ICD-10-CM

## 2014-02-22 DIAGNOSIS — F1410 Cocaine abuse, uncomplicated: Secondary | ICD-10-CM | POA: Diagnosis present

## 2014-02-22 DIAGNOSIS — O99324 Drug use complicating childbirth: Secondary | ICD-10-CM | POA: Diagnosis present

## 2014-02-22 DIAGNOSIS — J45909 Unspecified asthma, uncomplicated: Secondary | ICD-10-CM | POA: Diagnosis present

## 2014-02-22 DIAGNOSIS — O99334 Smoking (tobacco) complicating childbirth: Secondary | ICD-10-CM | POA: Diagnosis present

## 2014-02-22 DIAGNOSIS — F1910 Other psychoactive substance abuse, uncomplicated: Secondary | ICD-10-CM

## 2014-02-22 DIAGNOSIS — Z3A37 37 weeks gestation of pregnancy: Secondary | ICD-10-CM | POA: Diagnosis present

## 2014-02-22 DIAGNOSIS — F1210 Cannabis abuse, uncomplicated: Secondary | ICD-10-CM | POA: Diagnosis present

## 2014-02-22 DIAGNOSIS — Z885 Allergy status to narcotic agent status: Secondary | ICD-10-CM | POA: Diagnosis not present

## 2014-02-22 DIAGNOSIS — Z880 Allergy status to penicillin: Secondary | ICD-10-CM

## 2014-02-22 DIAGNOSIS — O288 Other abnormal findings on antenatal screening of mother: Secondary | ICD-10-CM | POA: Insufficient documentation

## 2014-02-22 DIAGNOSIS — F17210 Nicotine dependence, cigarettes, uncomplicated: Secondary | ICD-10-CM | POA: Diagnosis present

## 2014-02-22 DIAGNOSIS — O99824 Streptococcus B carrier state complicating childbirth: Secondary | ICD-10-CM | POA: Diagnosis present

## 2014-02-22 DIAGNOSIS — IMO0002 Reserved for concepts with insufficient information to code with codable children: Secondary | ICD-10-CM | POA: Diagnosis present

## 2014-02-22 LAB — DIFFERENTIAL
Basophils Absolute: 0 10*3/uL (ref 0.0–0.1)
Basophils Relative: 0 % (ref 0–1)
EOS PCT: 1 % (ref 0–5)
Eosinophils Absolute: 0 10*3/uL (ref 0.0–0.7)
LYMPHS PCT: 29 % (ref 12–46)
Lymphs Abs: 1.7 10*3/uL (ref 0.7–4.0)
MONOS PCT: 6 % (ref 3–12)
Monocytes Absolute: 0.3 10*3/uL (ref 0.1–1.0)
NEUTROS PCT: 64 % (ref 43–77)
Neutro Abs: 3.9 10*3/uL (ref 1.7–7.7)

## 2014-02-22 LAB — TYPE AND SCREEN
ABO/RH(D): O POS
ANTIBODY SCREEN: NEGATIVE

## 2014-02-22 LAB — RAPID URINE DRUG SCREEN, HOSP PERFORMED
AMPHETAMINES: NOT DETECTED
BARBITURATES: NOT DETECTED
Benzodiazepines: NOT DETECTED
COCAINE: POSITIVE — AB
Opiates: NOT DETECTED
TETRAHYDROCANNABINOL: NOT DETECTED

## 2014-02-22 LAB — URINALYSIS, ROUTINE W REFLEX MICROSCOPIC
Bilirubin Urine: NEGATIVE
Glucose, UA: NEGATIVE mg/dL
HGB URINE DIPSTICK: NEGATIVE
Ketones, ur: 15 mg/dL — AB
Leukocytes, UA: NEGATIVE
NITRITE: NEGATIVE
PROTEIN: NEGATIVE mg/dL
Urobilinogen, UA: 0.2 mg/dL (ref 0.0–1.0)
pH: 6 (ref 5.0–8.0)

## 2014-02-22 LAB — HEPATITIS B SURFACE ANTIGEN: HEP B S AG: NEGATIVE

## 2014-02-22 LAB — OB RESULTS CONSOLE GBS: GBS: NEGATIVE

## 2014-02-22 LAB — CBC
HCT: 33.1 % — ABNORMAL LOW (ref 36.0–46.0)
Hemoglobin: 11 g/dL — ABNORMAL LOW (ref 12.0–15.0)
MCH: 31 pg (ref 26.0–34.0)
MCHC: 33.2 g/dL (ref 30.0–36.0)
MCV: 93.2 fL (ref 78.0–100.0)
Platelets: 186 10*3/uL (ref 150–400)
RBC: 3.55 MIL/uL — ABNORMAL LOW (ref 3.87–5.11)
RDW: 13 % (ref 11.5–15.5)
WBC: 5.9 10*3/uL (ref 4.0–10.5)

## 2014-02-22 LAB — WET PREP, GENITAL
Clue Cells Wet Prep HPF POC: NONE SEEN
Trich, Wet Prep: NONE SEEN
YEAST WET PREP: NONE SEEN

## 2014-02-22 LAB — GROUP B STREP BY PCR: GROUP B STREP BY PCR: NEGATIVE

## 2014-02-22 MED ORDER — OXYTOCIN BOLUS FROM INFUSION: 500.0000 mL | INTRAVENOUS | Status: DC

## 2014-02-22 MED ORDER — LIDOCAINE HCL (PF) 1 % IJ SOLN: 30.0000 mL | INTRAMUSCULAR | Status: DC | PRN

## 2014-02-22 MED ORDER — LACTATED RINGERS IV SOLN
INTRAVENOUS | Status: DC
  Administered 2014-02-23: 04:00:00 via INTRAVENOUS

## 2014-02-22 MED ORDER — OXYTOCIN 40 UNITS IN LACTATED RINGERS INFUSION - SIMPLE MED
1.0000 m[IU]/min | INTRAVENOUS | Status: DC
  Filled 2014-02-22: qty 1000

## 2014-02-22 MED ORDER — ALBUTEROL SULFATE (2.5 MG/3ML) 0.083% IN NEBU
3.0000 mL | INHALATION_SOLUTION | RESPIRATORY_TRACT | Status: DC | PRN
  Administered 2014-02-22: 3 mL via RESPIRATORY_TRACT
  Filled 2014-02-22: qty 3

## 2014-02-22 MED ORDER — OXYTOCIN 40 UNITS IN LACTATED RINGERS INFUSION - SIMPLE MED
1.0000 m[IU]/min | INTRAVENOUS | Status: DC
  Administered 2014-02-22: 2 m[IU]/min via INTRAVENOUS

## 2014-02-22 MED ORDER — LACTATED RINGERS IV BOLUS (SEPSIS)
1000.0000 mL | Freq: Once | INTRAVENOUS | Status: AC
  Administered 2014-02-22: 1000 mL via INTRAVENOUS

## 2014-02-22 MED ORDER — TERBUTALINE SULFATE 1 MG/ML IJ SOLN: 0.2500 mg | Freq: Once | INTRAMUSCULAR | Status: AC | PRN

## 2014-02-22 MED ORDER — LACTATED RINGERS IV SOLN: 500.0000 mL | INTRAVENOUS | Status: DC | PRN

## 2014-02-22 MED ORDER — ONDANSETRON HCL 4 MG/2ML IJ SOLN: 4.0000 mg | Freq: Four times a day (QID) | INTRAMUSCULAR | Status: DC | PRN

## 2014-02-22 MED ORDER — FENTANYL CITRATE 0.05 MG/ML IJ SOLN
100.0000 ug | INTRAMUSCULAR | Status: DC | PRN
  Administered 2014-02-22 – 2014-02-23 (×2): 100 ug via INTRAVENOUS
  Filled 2014-02-22 (×2): qty 2

## 2014-02-22 MED ORDER — OXYTOCIN 40 UNITS IN LACTATED RINGERS INFUSION - SIMPLE MED: 62.5000 mL/h | INTRAVENOUS | Status: DC

## 2014-02-22 MED ORDER — ACETAMINOPHEN 325 MG PO TABS: 650.0000 mg | ORAL_TABLET | ORAL | Status: DC | PRN

## 2014-02-22 MED ORDER — CITRIC ACID-SODIUM CITRATE 334-500 MG/5ML PO SOLN: 30.0000 mL | ORAL | Status: DC | PRN

## 2014-02-22 NOTE — MAU Provider Note (Signed)
  History     CSN: [id number]  Arrival date and time: 02/22/14 [id number]   First Provider Initiated Contact with Patient 02/22/14 [phone]      Chief Complaint  Patient presents with  . Labor Eval   HPI  34 yo G3P2 3554w2d presents with n/v x4 days and vaginal discharge x 4 days with itching. Known sick contact has flu. She has decreased appetite and has not been drinking for past 3 days. Says she had fever of 101 F yesterday.   No LOF, baby is moving well, no lochia.     Past Medical History  Diagnosis Date  . Bronchitis, acute   . Asthma   . Hypertension   . Drug abuse, cocaine type   . Drug abuse, marijuana     Past Surgical History  Procedure Laterality Date  . Hand surgery    . Appendectomy      Family History  Problem Relation Age of Onset  . Cancer Other     History  Substance Use Topics  . Smoking status: Current Every Day Smoker -- 0.25 packs/day    Types: Cigarettes  . Smokeless tobacco: Not on file  . Alcohol Use: Yes     Comment: staates alcohol at 3 months pregnat but none since    Allergies:  Allergies  Allergen Reactions  . Penicillins Nausea And Vomiting  . Percocet [Oxycodone-Acetaminophen] Nausea And Vomiting    Prescriptions prior to admission  Medication Sig Dispense Refill Last Dose  . albuterol (PROVENTIL HFA;VENTOLIN HFA) 108 (90 BASE) MCG/ACT inhaler Inhale 2 puffs into the lungs every 6 (six) hours as needed. For shortness of breath   02/22/2014 at Unknown time  . ondansetron (ZOFRAN) 4 MG tablet Take 4 mg by mouth every 8 (eight) hours as needed for nausea.   Past Month at Unknown time    Review of Systems  Constitutional: Positive for fever. Negative for chills.       Decreased appetite, decreased oral intake  Cardiovascular: Negative for chest pain.  Gastrointestinal: Positive for nausea and vomiting. Negative for abdominal pain, diarrhea and blood in stool.  Genitourinary: Positive for hematuria. Negative for dysuria, urgency and  frequency.       Pinkish vaginal discharge, vaginal itching   Physical Exam   Blood pressure 120/76, pulse 69, temperature 98.4 F (36.9 C), temperature source Oral, resp. rate 18, height 5\' 10"  (1.778 m), weight 77.111 kg (170 lb), SpO2 99 %.  Physical Exam  Constitutional: She is oriented to person, place, and time. She appears well-developed.  HENT:  Head: Normocephalic and atraumatic.  Cardiovascular: Normal rate, regular rhythm and normal heart sounds.   Respiratory: Effort normal and breath sounds normal.  Genitourinary: Vagina normal. Cervix exhibits discharge.  Cervix closed, no bleeding from cervix. White discharge note on speculum exam  Neurological: She is alert and oriented to person, place, and time.  Skin: Skin is warm.    MAU Course  Procedures  MDM NST reactive  Assessment and Plan  34 yo G3P2 at 2054w2d presents with n/v and vaginal discharge.  # n/v- most likely secondary to viral gastroenteritis. Possibly flu. Also consider drug related n/v due to cocaine use/withdrawal. Possible preterm labor because she is having intermittent contractions - U/S - BPP  # vaginal discharge - wet prep neg - gc/chlamydia pending  Dispo- discharge home with follow up if U/S normal.  Deborha PaymentMeyer, Ashley L 02/22/2014, 11:31 AM
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
## 2014-02-22 NOTE — Plan of Care (Signed)
Problem: Consults Goal: Birthing Suites Patient Information Press F2 to bring up selections list  Outcome: Completed/Met Date Met:  02/22/14  Pt 37-[redacted] weeks EGA, Inpatient induction and Other (specify with a note), present drug abuse, social issues.

## 2014-02-22 NOTE — Progress Notes (Signed)
Patient ID: Nicole Mahoney, female   DOB: 10-29-1980, 34 y.o.   MRN: [id number] Nicole Mahoney is a 34 y.o. G3P2 at 8423w2d.  Subjective: Resting comfortably   Objective: BP 145/61 mmHg  Pulse 67  Temp(Src) 98.9 F (37.2 C) (Oral)  Resp 18  Ht 5\' 10"  (1.778 m)  Wt 87.091 kg (192 lb)  BMI 27.55 kg/m2  SpO2 99%   FHT:  FHR: 130 bpm, variability: min-moderate,  accelerations:  10x10,  decelerations:  none UC:   UI  Dilation: 1.5 Effacement (%): 70 Station: -2, -1 Presentation: Vertex Exam by:: patti moore rn  Labs: Results for orders placed or performed during the hospital encounter of 02/22/14 (from the past 24 hour(s))  OB RESULT CONSOLE Group B Strep     Status: None   Collection Time: 02/22/14 12:00 AM  Result Value Ref Range   GBS Negative   Urinalysis, Routine w reflex microscopic     Status: Abnormal   Collection Time: 02/22/14  9:10 AM  Result Value Ref Range   Color, Urine YELLOW YELLOW   APPearance CLEAR CLEAR   Specific Gravity, Urine <1.005 (L) 1.005 - 1.030   pH 6.0 5.0 - 8.0   Glucose, UA NEGATIVE NEGATIVE mg/dL   Hgb urine dipstick NEGATIVE NEGATIVE   Bilirubin Urine NEGATIVE NEGATIVE   Ketones, ur 15 (A) NEGATIVE mg/dL   Protein, ur NEGATIVE NEGATIVE mg/dL   Urobilinogen, UA 0.2 0.0 - 1.0 mg/dL   Nitrite NEGATIVE NEGATIVE   Leukocytes, UA NEGATIVE NEGATIVE  Urine rapid drug screen (hosp performed)     Status: Abnormal   Collection Time: 02/22/14  9:10 AM  Result Value Ref Range   Opiates NONE DETECTED NONE DETECTED   Cocaine POSITIVE (A) NONE DETECTED   Benzodiazepines NONE DETECTED NONE DETECTED   Amphetamines NONE DETECTED NONE DETECTED   Tetrahydrocannabinol NONE DETECTED NONE DETECTED   Barbiturates NONE DETECTED NONE DETECTED  Wet prep, genital     Status: Abnormal   Collection Time: 02/22/14 10:28 AM  Result Value Ref Range   Yeast Wet Prep HPF POC NONE SEEN NONE SEEN   Trich, Wet Prep NONE SEEN NONE SEEN   Clue Cells Wet Prep HPF POC  NONE SEEN NONE SEEN   WBC, Wet Prep HPF POC FEW (A) NONE SEEN  Hepatitis B surface antigen     Status: None   Collection Time: 02/22/14 10:59 AM  Result Value Ref Range   Hepatitis B Surface Ag NEGATIVE NEGATIVE  CBC     Status: Abnormal   Collection Time: 02/22/14 10:59 AM  Result Value Ref Range   WBC 5.9 4.0 - 10.5 K/uL   RBC 3.55 (L) 3.87 - 5.11 MIL/uL   Hemoglobin 11.0 (L) 12.0 - 15.0 g/dL   HCT 40.933.1 (L) 81.136.0 - 91.446.0 %   MCV 93.2 78.0 - 100.0 fL   MCH 31.0 26.0 - 34.0 pg   MCHC 33.2 30.0 - 36.0 g/dL   RDW 78.213.0 95.611.5 - 21.315.5 %   Platelets 186 150 - 400 K/uL  Differential     Status: None   Collection Time: 02/22/14 10:59 AM  Result Value Ref Range   Neutrophils Relative % 64 43 - 77 %   Neutro Abs 3.9 1.7 - 7.7 K/uL   Lymphocytes Relative 29 12 - 46 %   Lymphs Abs 1.7 0.7 - 4.0 K/uL   Monocytes Relative 6 3 - 12 %   Monocytes Absolute 0.3 0.1 - 1.0 K/uL   Eosinophils  Relative 1 0 - 5 %   Eosinophils Absolute 0.0 0.0 - 0.7 K/uL   Basophils Relative 0 0 - 1 %   Basophils Absolute 0.0 0.0 - 0.1 K/uL  Type and screen     Status: None   Collection Time: 02/22/14 10:59 AM  Result Value Ref Range   ABO/RH(D) O POS    Antibody Screen NEG    Sample Expiration 02/25/2014   Group B strep by PCR     Status: None   Collection Time: 02/22/14  3:22 PM  Result Value Ref Range   Group B strep by PCR NEGATIVE NEGATIVE    Assessment / Plan: [redacted]w[redacted]d week IUP Labor: IOL. Foley bulb in place Fetal Wellbeing:  Category I-II Pain Control:  None Anticipated MOD:  NSVD SW consult PP Light laboring diet  [place], [place] 02/22/2014 9:55 PM

## 2014-02-22 NOTE — MAU Note (Signed)
No care since October
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
## 2014-02-23 ENCOUNTER — Inpatient Hospital Stay (HOSPITAL_COMMUNITY): Payer: Medicaid Other | Admitting: Anesthesiology

## 2014-02-23 ENCOUNTER — Encounter (HOSPITAL_COMMUNITY): Payer: Self-pay | Admitting: *Deleted

## 2014-02-23 DIAGNOSIS — O288 Other abnormal findings on antenatal screening of mother: Secondary | ICD-10-CM | POA: Insufficient documentation

## 2014-02-23 DIAGNOSIS — O99324 Drug use complicating childbirth: Secondary | ICD-10-CM

## 2014-02-23 DIAGNOSIS — O99334 Smoking (tobacco) complicating childbirth: Secondary | ICD-10-CM

## 2014-02-23 DIAGNOSIS — Z3A37 37 weeks gestation of pregnancy: Secondary | ICD-10-CM | POA: Insufficient documentation

## 2014-02-23 DIAGNOSIS — F1410 Cocaine abuse, uncomplicated: Secondary | ICD-10-CM

## 2014-02-23 DIAGNOSIS — F1910 Other psychoactive substance abuse, uncomplicated: Secondary | ICD-10-CM | POA: Insufficient documentation

## 2014-02-23 LAB — HIV ANTIBODY (ROUTINE TESTING W REFLEX)
HIV 1/HIV 2 AB: NONREACTIVE
HIV 1/O/2 Abs-Index Value: <1 (ref <1.00)

## 2014-02-23 LAB — RPR: RPR Ser Ql: NONREACTIVE

## 2014-02-23 LAB — GC/CHLAMYDIA PROBE AMP (~~LOC~~) NOT AT ARMC
Chlamydia: NEGATIVE
Neisseria Gonorrhea: NEGATIVE

## 2014-02-23 LAB — RUBELLA SCREEN: Rubella: 2.36 index (ref >0.99)

## 2014-02-23 MED ORDER — EPHEDRINE 5 MG/ML INJ: 10.0000 mg | INTRAVENOUS | Status: DC | PRN

## 2014-02-23 MED ORDER — DIPHENHYDRAMINE HCL 50 MG/ML IJ SOLN: 12.5000 mg | INTRAMUSCULAR | Status: DC | PRN

## 2014-02-23 MED ORDER — ONDANSETRON HCL 4 MG/2ML IJ SOLN: 4.0000 mg | INTRAMUSCULAR | Status: DC | PRN

## 2014-02-23 MED ORDER — IBUPROFEN 600 MG PO TABS
600.0000 mg | ORAL_TABLET | Freq: Four times a day (QID) | ORAL | Status: DC
  Administered 2014-02-23 – 2014-02-25 (×10): 600 mg via ORAL
  Filled 2014-02-23 (×10): qty 1

## 2014-02-23 MED ORDER — TETANUS-DIPHTH-ACELL PERTUSSIS 5-2.5-18.5 LF-MCG/0.5 IM SUSP
0.5000 mL | Freq: Once | INTRAMUSCULAR | Status: AC
  Administered 2014-02-23: 0.5 mL via INTRAMUSCULAR
  Filled 2014-02-23: qty 0.5

## 2014-02-23 MED ORDER — FENTANYL 2.5 MCG/ML BUPIVACAINE 1/10 % EPIDURAL INFUSION (WH - ANES)
14.0000 mL/h | INTRAMUSCULAR | Status: DC | PRN
Start: 2014-02-23 — End: 2014-02-23
  Administered 2014-02-23: 14 mL/h via EPIDURAL
  Filled 2014-02-23: qty 125

## 2014-02-23 MED ORDER — PHENYLEPHRINE 40 MCG/ML (10ML) SYRINGE FOR IV PUSH (FOR BLOOD PRESSURE SUPPORT)
80.0000 ug | PREFILLED_SYRINGE | INTRAVENOUS | Status: DC | PRN
  Filled 2014-02-23: qty 20

## 2014-02-23 MED ORDER — ONDANSETRON HCL 4 MG PO TABS
4.0000 mg | ORAL_TABLET | ORAL | Status: DC | PRN
Start: 2014-02-23 — End: 2014-02-25
  Administered 2014-02-23: 4 mg via ORAL
  Filled 2014-02-23: qty 1

## 2014-02-23 MED ORDER — OXYCODONE-ACETAMINOPHEN 5-325 MG PO TABS
1.0000 | ORAL_TABLET | ORAL | Status: DC | PRN
  Administered 2014-02-23 – 2014-02-25 (×10): 1 via ORAL
  Filled 2014-02-23 (×10): qty 1

## 2014-02-23 MED ORDER — LANOLIN HYDROUS EX OINT: TOPICAL_OINTMENT | CUTANEOUS | Status: DC | PRN

## 2014-02-23 MED ORDER — DIBUCAINE 1 % RE OINT
1.0000 "application " | TOPICAL_OINTMENT | RECTAL | Status: DC | PRN
  Filled 2014-02-23: qty 28

## 2014-02-23 MED ORDER — DIPHENHYDRAMINE HCL 25 MG PO CAPS: 25.0000 mg | ORAL_CAPSULE | Freq: Four times a day (QID) | ORAL | Status: DC | PRN

## 2014-02-23 MED ORDER — SIMETHICONE 80 MG PO CHEW: 80.0000 mg | CHEWABLE_TABLET | ORAL | Status: DC | PRN

## 2014-02-23 MED ORDER — BUPIVACAINE HCL (PF) 0.25 % IJ SOLN
INTRAMUSCULAR | Status: DC | PRN
  Administered 2014-02-23 (×2): 4 mL via EPIDURAL

## 2014-02-23 MED ORDER — SENNOSIDES-DOCUSATE SODIUM 8.6-50 MG PO TABS
2.0000 | ORAL_TABLET | ORAL | Status: DC
  Administered 2014-02-23 – 2014-02-24 (×2): 2 via ORAL
  Filled 2014-02-23 (×2): qty 2

## 2014-02-23 MED ORDER — OXYTOCIN 40 UNITS IN LACTATED RINGERS INFUSION - SIMPLE MED: 62.5000 mL/h | INTRAVENOUS | Status: DC | PRN

## 2014-02-23 MED ORDER — PNEUMOCOCCAL VAC POLYVALENT 25 MCG/0.5ML IJ INJ
0.5000 mL | INJECTION | INTRAMUSCULAR | Status: AC
  Administered 2014-02-24: 0.5 mL via INTRAMUSCULAR
  Filled 2014-02-23: qty 0.5

## 2014-02-23 MED ORDER — PRENATAL MULTIVITAMIN CH
1.0000 | ORAL_TABLET | Freq: Every day | ORAL | Status: DC
  Administered 2014-02-23 – 2014-02-25 (×3): 1 via ORAL
  Filled 2014-02-23 (×3): qty 1

## 2014-02-23 MED ORDER — LIDOCAINE-EPINEPHRINE (PF) 2 %-1:200000 IJ SOLN
INTRAMUSCULAR | Status: DC | PRN
  Administered 2014-02-23: 3 mL

## 2014-02-23 MED ORDER — LACTATED RINGERS IV SOLN
500.0000 mL | Freq: Once | INTRAVENOUS | Status: AC
  Administered 2014-02-23: 500 mL via INTRAVENOUS

## 2014-02-23 MED ORDER — WITCH HAZEL-GLYCERIN EX PADS: 1.0000 "application " | MEDICATED_PAD | CUTANEOUS | Status: DC | PRN

## 2014-02-23 MED ORDER — INFLUENZA VAC SPLIT QUAD 0.5 ML IM SUSY: 0.5000 mL | PREFILLED_SYRINGE | INTRAMUSCULAR | Status: DC | PRN

## 2014-02-23 MED ORDER — PHENYLEPHRINE 40 MCG/ML (10ML) SYRINGE FOR IV PUSH (FOR BLOOD PRESSURE SUPPORT): 80.0000 ug | PREFILLED_SYRINGE | INTRAVENOUS | Status: DC | PRN

## 2014-02-23 MED ORDER — BENZOCAINE-MENTHOL 20-0.5 % EX AERO
1.0000 "application " | INHALATION_SPRAY | CUTANEOUS | Status: DC | PRN
  Administered 2014-02-23: 1 via TOPICAL
  Filled 2014-02-23 (×2): qty 56

## 2014-02-23 NOTE — Progress Notes (Signed)
Ur chart review completed.  

## 2014-02-23 NOTE — Anesthesia Postprocedure Evaluation (Signed)
  Anesthesia Post-op Note  Patient: Nicole Mahoney  Procedure(s) Performed: * No procedures listed *  Patient Location: Mother/Baby  Anesthesia Type:Epidural  Level of Consciousness: awake  Airway and Oxygen Therapy: Patient Spontanous Breathing  Post-op Pain: mild  Post-op Assessment: Patient's Cardiovascular Status Stable and Respiratory Function Stable  Post-op Vital Signs: stable  Last Vitals:  Filed Vitals:   02/23/14 1015  BP: 126/72  Pulse: 62  Temp: 36.8 C  Resp: 18    Complications: No apparent anesthesia complications

## 2014-02-23 NOTE — Progress Notes (Signed)
Patient ID: Nicole Sellsequilla M Dunkleberger, female   DOB: 17-Jul-1980, 34 y.o.   MRN: [id number] Nicole Mahoney is a 34 y.o. G3P2 at 5042w3d.  Subjective: Coping well w/ UC's after Fentanyl.   Objective: BP 128/65 mmHg  Pulse 52  Temp(Src) 98.4 F (36.9 C) (Oral)  Resp 18  Ht 5\' 10"  (1.778 m)  Wt 87.091 kg (192 lb)  BMI 27.55 kg/m2  SpO2 99%   FHT:  FHR: 135 bpm, variability: min-mod,  accelerations:  none,  decelerations:  Few mild variables.  UC:   Q 3-4 minutes, mild-moderate  Dilation: 5.5 Effacement (%): 80 Cervical Position: Posterior Station: -2 Presentation: Vertex Exam by:: Ivonne AndrewV. Mylik Pro, CNM  Assessment / Plan: 2642w3d week IUP Labor: early Fetal Wellbeing:  Category II Pain Control:  Fentanyl Anticipated MOD:  NSVD  Nicole Mahoney, CNM 02/23/2014 2:20 AM

## 2014-02-23 NOTE — Anesthesia Procedure Notes (Addendum)
Epidural Patient location during procedure: OB  Staffing Anesthesiologist: MOSER, CHRIS Performed by: anesthesiologist   Preanesthetic Checklist Completed: patient identified, surgical consent, pre-op evaluation, timeout performed, IV checked, risks and benefits discussed and monitors and equipment checked  Epidural Patient position: sitting Prep: site prepped and draped and DuraPrep Patient monitoring: heart rate, cardiac monitor, continuous pulse ox and blood pressure Approach: midline Location: L4-L5 Injection technique: LOR saline  Needle:  Needle type: Tuohy  Needle gauge: 17 G Needle length: 9 cm Needle insertion depth: 6 cm Catheter type: closed end flexible Catheter size: 19 Gauge Catheter at skin depth: 12 cm Test dose: negative and 2% lidocaine with Epi 1:200 K  Assessment Events: blood not aspirated, injection not painful, no injection resistance, negative IV test and no paresthesia  Additional Notes H+P and labs checked, risks and benefits discussed with the patient, consent obtained, procedure tolerated well and without complications.  Reason for block:procedure for pain   
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
## 2014-02-23 NOTE — Progress Notes (Signed)
Patient ID: Nicole Sellsequilla M Abruzzo, female   DOB: 1980/10/16, 34 y.o.   MRN: [id number] Nicole Mahoney is a 34 y.o. G3P2 at 1977w3d.  Subjective: Cramping  Objective: BP 104/66 mmHg  Pulse 61  Temp(Src) 98.4 F (36.9 C) (Oral)  Resp 18  Ht 5\' 10"  (1.778 m)  Wt 87.091 kg (192 lb)  BMI 27.55 kg/m2  SpO2 99%   FHT:  FHR: 130 bpm, variability: min-mod,  accelerations:  15x15,  decelerations:  none UC:   irreg, mild Dilation: 5 Effacement (%): 40 Station: -2 Presentation: Vertex Exam by:: e .poore, rn  Assessment / Plan: 9377w3d week IUP Labor: early Fetal Wellbeing:  Category I-II Pain Control:  none Anticipated MOD:  NSVD Start pitocin  [place] Giordano Getman, CNM 02/23/2014 12:29 AM

## 2014-02-23 NOTE — Progress Notes (Signed)
Pt was lying in bed when I arrived. Realizing pt had possibly recently delivered, I confirmed w/her and nurse. I apologized and told her I would refer her for a later visit. Pt said she was drained and I assured her I totally understood. Nicole Mahoney Chaplain   02/23/14 1300  Clinical Encounter Type  Visited With Patient

## 2014-02-23 NOTE — Anesthesia Preprocedure Evaluation (Signed)
Anesthesia Evaluation  Patient identified by MRN, date of birth, ID band Patient awake    Reviewed: Allergy & Precautions, NPO status , Patient's Chart, lab work & pertinent test results  History of Anesthesia Complications Negative for: history of anesthetic complications  Airway Mallampati: II  TM Distance: >3 FB Neck ROM: Full    Dental  (+) Teeth Intact   Pulmonary asthma , Current Smoker,  breath sounds clear to auscultation        Cardiovascular Rhythm:Regular     Neuro/Psych negative neurological ROS  negative psych ROS   GI/Hepatic negative GI ROS, Neg liver ROS,   Endo/Other  negative endocrine ROS  Renal/GU negative Renal ROS     Musculoskeletal   Abdominal   Peds  Hematology  (+) anemia ,   Anesthesia Other Findings MJ cocaine use history  Reproductive/Obstetrics (+) Pregnancy                             Anesthesia Physical Anesthesia Plan  ASA: II  Anesthesia Plan: Epidural   Post-op Pain Management:    Induction:   Airway Management Planned:   Additional Equipment:   Intra-op Plan:   Post-operative Plan:   Informed Consent: I have reviewed the patients History and Physical, chart, labs and discussed the procedure including the risks, benefits and alternatives for the proposed anesthesia with the patient or authorized representative who has indicated his/her understanding and acceptance.     Plan Discussed with: Anesthesiologist  Anesthesia Plan Comments:         Anesthesia Quick Evaluation

## 2014-02-24 NOTE — Progress Notes (Signed)
CSW followed up with CPS after CPS assessment completed.   CPS confirmed that the [name] continues to be an unreliable historian as she continues to report conflicting information regarding housing and custody.  CPS shared that the [name] will be unable to take the baby at home at time of discharge.  [name] originally reported no support and denied awareness of anyone who would be a viable option for a kinship placement; however, when CPS returned to discuss potential kinship placements, the [name] reported that she may have a friend.  CPS stated that he will need to investigate the potential placement, complete background checks, home visits prior to determining if it the placement is appropriate.  CPS denied need to schedule a Team Decision Meeting (TDM) at this time.  CPS reported intention to follow up with the [name]'s potential plan, and will provide CSW with updates.  CSW and CPS discussed tentative TDM on 1/25 if [name]'s plan is not appropriate.   CSW to continue to closely follow.

## 2014-02-24 NOTE — Progress Notes (Signed)
Post Partum Day #1  Subjective: up ad lib, voiding, tolerating PO, + flatus and has not had a BM yet. Had some nausea, vomiting, and dizziness last night, was given zofran and she felt better. Denies fever, chills, headaches, changes in vision. Complains of abdominal pain 10/10 that is not relieved with medication.   Objective: Blood pressure 122/61, pulse 56, temperature 97.9 F (36.6 C), temperature source Oral, resp. rate 18, height 5\' 10"  (1.778 m), weight 87.091 kg (192 lb), SpO2 100 %, unknown if currently breastfeeding.  Physical Exam:  General: alert, cooperative and no distress Lochia: appropriate Heart: heart sounds nml Lungs: Breath sounds nml Uterine Fundus: firm Incision: n/a DVT Evaluation: No evidence of DVT seen on physical exam.   Recent Labs  02/22/14 1059  HGB 11.0*  HCT 33.1*    Assessment/Plan: Plan for discharge tomorrow and Contraception is OCPs. Outpatient circumcision.    LOS: 2 days   Deborha PaymentMeyer, Ashley L PA-S 02/24/2014, 9:01 AM

## 2014-02-24 NOTE — Progress Notes (Signed)
Post Partum Day 1 Subjective: Pt reported pain relieved with pain meds.  Voiding, decreased bleeding.  Bottle feeding.  Per review of CSW note, pending CPS visit.  Based on note unlikely infant will be discharge with patient.  Pt verbalizes desire for ocps.    Objective: Blood pressure 122/61, pulse 56, temperature 97.9 F (36.6 C), temperature source Oral, resp. rate 18, height 5\' 10"  (1.778 m), weight 87.091 kg (192 lb), SpO2 100 %, unknown if currently breastfeeding.  Physical Exam:  Filed Vitals:   02/24/14 0620  BP: 122/61  Pulse: 56  Temp: 97.9 F (36.6 C)  Resp: 18    General: alert, cooperative and appears stated age Lochia: appropriate Uterine Fundus: firm Incision: n/a DVT Evaluation: No evidence of DVT seen on physical exam. Negative Homan's sign.   Recent Labs  02/22/14 1059  HGB 11.0*  HCT 33.1*    Assessment/Plan: Plan for discharge tomorrow Continue with CSW consultation to determine plan for discharge/infant.    LOS: 2 days   Elenora FenderKARIM, [facility] New[facility] Medical CenterWALIDAH N 02/24/2014, 10:10 AM
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
## 2014-02-25 LAB — CULTURE, BETA STREP (GROUP B ONLY)

## 2014-02-25 MED ORDER — IBUPROFEN 600 MG PO TABS: 600.0000 mg | ORAL_TABLET | Freq: Four times a day (QID) | ORAL | Status: DC

## 2014-02-25 MED ORDER — MAGNESIUM HYDROXIDE 400 MG/5ML PO SUSP
15.0000 mL | Freq: Every day | ORAL | Status: DC
  Administered 2014-02-25: 15 mL via ORAL
  Filled 2014-02-25: qty 30
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
## 2014-02-25 NOTE — Progress Notes (Signed)
   02/25/14 1300  Clinical Encounter Type  Visited With Patient not available;Health care provider  Visit Type Follow-up  Referral From Chaplain   Attempted f/u visit, but pt sleeping.  Will follow, but please page as needs arise or visit would be appropriate.  Thank you.  [address]Chaplain Gumaro Brightbill RutherfordLundeen, South DakotaMDiv [phone]

## 2014-02-25 NOTE — Discharge Instructions (Signed)

## 2014-03-08 ENCOUNTER — Observation Stay (HOSPITAL_COMMUNITY)
Admission: EM | Admit: 2014-03-08 | Discharge: 2014-03-12 | Payer: Medicaid Other | Attending: Internal Medicine | Admitting: Internal Medicine

## 2014-03-08 DIAGNOSIS — R062 Wheezing: Secondary | ICD-10-CM | POA: Diagnosis not present

## 2014-03-08 DIAGNOSIS — R400 Somnolence: Secondary | ICD-10-CM | POA: Insufficient documentation

## 2014-03-08 DIAGNOSIS — F0630 Mood disorder due to known physiological condition, unspecified: Secondary | ICD-10-CM

## 2014-03-08 DIAGNOSIS — R600 Localized edema: Secondary | ICD-10-CM | POA: Diagnosis not present

## 2014-03-08 DIAGNOSIS — F1990 Other psychoactive substance use, unspecified, uncomplicated: Secondary | ICD-10-CM | POA: Diagnosis present

## 2014-03-08 DIAGNOSIS — Z885 Allergy status to narcotic agent status: Secondary | ICD-10-CM | POA: Insufficient documentation

## 2014-03-08 DIAGNOSIS — I10 Essential (primary) hypertension: Secondary | ICD-10-CM | POA: Diagnosis not present

## 2014-03-08 DIAGNOSIS — F17210 Nicotine dependence, cigarettes, uncomplicated: Secondary | ICD-10-CM | POA: Insufficient documentation

## 2014-03-08 DIAGNOSIS — F329 Major depressive disorder, single episode, unspecified: Secondary | ICD-10-CM | POA: Insufficient documentation

## 2014-03-08 DIAGNOSIS — H5462 Unqualified visual loss, left eye, normal vision right eye: Secondary | ICD-10-CM

## 2014-03-08 DIAGNOSIS — F1210 Cannabis abuse, uncomplicated: Secondary | ICD-10-CM | POA: Diagnosis not present

## 2014-03-08 DIAGNOSIS — R200 Anesthesia of skin: Secondary | ICD-10-CM | POA: Insufficient documentation

## 2014-03-08 DIAGNOSIS — J209 Acute bronchitis, unspecified: Secondary | ICD-10-CM | POA: Insufficient documentation

## 2014-03-08 DIAGNOSIS — T63441A Toxic effect of venom of bees, accidental (unintentional), initial encounter: Principal | ICD-10-CM | POA: Insufficient documentation

## 2014-03-08 DIAGNOSIS — H547 Unspecified visual loss: Secondary | ICD-10-CM | POA: Insufficient documentation

## 2014-03-08 DIAGNOSIS — H5712 Ocular pain, left eye: Secondary | ICD-10-CM | POA: Diagnosis not present

## 2014-03-08 DIAGNOSIS — R4182 Altered mental status, unspecified: Secondary | ICD-10-CM | POA: Diagnosis present

## 2014-03-08 DIAGNOSIS — R45851 Suicidal ideations: Secondary | ICD-10-CM | POA: Insufficient documentation

## 2014-03-08 DIAGNOSIS — F1410 Cocaine abuse, uncomplicated: Secondary | ICD-10-CM | POA: Diagnosis present

## 2014-03-08 DIAGNOSIS — Z880 Allergy status to penicillin: Secondary | ICD-10-CM | POA: Insufficient documentation

## 2014-03-08 DIAGNOSIS — Y929 Unspecified place or not applicable: Secondary | ICD-10-CM | POA: Insufficient documentation

## 2014-03-08 DIAGNOSIS — T7840XA Allergy, unspecified, initial encounter: Secondary | ICD-10-CM | POA: Insufficient documentation

## 2014-03-08 DIAGNOSIS — F1994 Other psychoactive substance use, unspecified with psychoactive substance-induced mood disorder: Secondary | ICD-10-CM

## 2014-03-09 ENCOUNTER — Encounter (HOSPITAL_COMMUNITY): Payer: Self-pay | Admitting: Emergency Medicine

## 2014-03-09 ENCOUNTER — Emergency Department (HOSPITAL_COMMUNITY): Payer: Medicaid Other

## 2014-03-09 ENCOUNTER — Inpatient Hospital Stay (HOSPITAL_COMMUNITY): Payer: Medicaid Other

## 2014-03-09 DIAGNOSIS — F17210 Nicotine dependence, cigarettes, uncomplicated: Secondary | ICD-10-CM

## 2014-03-09 DIAGNOSIS — R400 Somnolence: Secondary | ICD-10-CM | POA: Diagnosis present

## 2014-03-09 DIAGNOSIS — F1210 Cannabis abuse, uncomplicated: Secondary | ICD-10-CM

## 2014-03-09 DIAGNOSIS — R4182 Altered mental status, unspecified: Secondary | ICD-10-CM | POA: Diagnosis present

## 2014-03-09 DIAGNOSIS — T7840XA Allergy, unspecified, initial encounter: Secondary | ICD-10-CM

## 2014-03-09 DIAGNOSIS — R200 Anesthesia of skin: Secondary | ICD-10-CM

## 2014-03-09 DIAGNOSIS — H5712 Ocular pain, left eye: Secondary | ICD-10-CM

## 2014-03-09 DIAGNOSIS — H547 Unspecified visual loss: Secondary | ICD-10-CM | POA: Insufficient documentation

## 2014-03-09 DIAGNOSIS — X58XXXA Exposure to other specified factors, initial encounter: Secondary | ICD-10-CM

## 2014-03-09 DIAGNOSIS — F1410 Cocaine abuse, uncomplicated: Secondary | ICD-10-CM

## 2014-03-09 DIAGNOSIS — F329 Major depressive disorder, single episode, unspecified: Secondary | ICD-10-CM

## 2014-03-09 LAB — CBC WITH DIFFERENTIAL/PLATELET
BASOS ABS: 0.1 10*3/uL (ref 0.0–0.1)
Basophils Relative: 1 % (ref 0–1)
Eosinophils Absolute: 0.2 10*3/uL (ref 0.0–0.7)
Eosinophils Relative: 2 % (ref 0–5)
HEMATOCRIT: 36.7 % (ref 36.0–46.0)
HEMOGLOBIN: 12 g/dL (ref 12.0–15.0)
Lymphocytes Relative: 27 % (ref 12–46)
Lymphs Abs: 2.6 10*3/uL (ref 0.7–4.0)
MCH: 29.8 pg (ref 26.0–34.0)
MCHC: 32.7 g/dL (ref 30.0–36.0)
MCV: 91.1 fL (ref 78.0–100.0)
Monocytes Absolute: 0.2 10*3/uL (ref 0.1–1.0)
Monocytes Relative: 2 % — ABNORMAL LOW (ref 3–12)
NEUTROS PCT: 68 % (ref 43–77)
Neutro Abs: 6.5 10*3/uL (ref 1.7–7.7)
Platelets: 286 10*3/uL (ref 150–400)
RBC: 4.03 MIL/uL (ref 3.87–5.11)
RDW: 13.3 % (ref 11.5–15.5)
WBC: 9.6 10*3/uL (ref 4.0–10.5)

## 2014-03-09 LAB — HEPATIC FUNCTION PANEL
ALBUMIN: 3.6 g/dL (ref 3.5–5.2)
ALT: 33 U/L (ref 0–35)
AST: 19 U/L (ref 0–37)
Alkaline Phosphatase: 87 U/L (ref 39–117)
Total Bilirubin: 0.6 mg/dL (ref 0.3–1.2)
Total Protein: 7 g/dL (ref 6.0–8.3)

## 2014-03-09 LAB — URINALYSIS, ROUTINE W REFLEX MICROSCOPIC
Bilirubin Urine: NEGATIVE
Glucose, UA: NEGATIVE mg/dL
Hgb urine dipstick: NEGATIVE
Ketones, ur: NEGATIVE mg/dL
Leukocytes, UA: NEGATIVE
NITRITE: NEGATIVE
Protein, ur: NEGATIVE mg/dL
Specific Gravity, Urine: 1.015 (ref 1.005–1.030)
UROBILINOGEN UA: 0.2 mg/dL (ref 0.0–1.0)
pH: 5.5 (ref 5.0–8.0)

## 2014-03-09 LAB — TROPONIN I: Troponin I: <0.03 ng/mL (ref <0.031)

## 2014-03-09 LAB — BASIC METABOLIC PANEL
Anion gap: 6 (ref 5–15)
BUN: 10 mg/dL (ref 6–23)
CHLORIDE: 108 mmol/L (ref 96–112)
CO2: 25 mmol/L (ref 19–32)
Calcium: 8.3 mg/dL — ABNORMAL LOW (ref 8.4–10.5)
Creatinine, Ser: 1.1 mg/dL (ref 0.50–1.10)
GFR calc Af Amer: 77 mL/min — ABNORMAL LOW (ref >90)
GFR calc non Af Amer: 66 mL/min — ABNORMAL LOW (ref >90)
Glucose, Bld: 133 mg/dL — ABNORMAL HIGH (ref 70–99)
POTASSIUM: 3.4 mmol/L — AB (ref 3.5–5.1)
Sodium: 139 mmol/L (ref 135–145)

## 2014-03-09 LAB — RAPID URINE DRUG SCREEN, HOSP PERFORMED
Amphetamines: NOT DETECTED
BARBITURATES: NOT DETECTED
Benzodiazepines: NOT DETECTED
COCAINE: POSITIVE — AB
Opiates: NOT DETECTED
Tetrahydrocannabinol: NOT DETECTED

## 2014-03-09 MED ORDER — MAGNESIUM SULFATE 2 GM/50ML IV SOLN
2.0000 g | Freq: Once | INTRAVENOUS | Status: AC
  Administered 2014-03-09: 2 g via INTRAVENOUS
  Filled 2014-03-09: qty 50

## 2014-03-09 MED ORDER — ONDANSETRON HCL 4 MG/2ML IJ SOLN: 4.0000 mg | Freq: Four times a day (QID) | INTRAMUSCULAR | Status: DC | PRN

## 2014-03-09 MED ORDER — PANTOPRAZOLE SODIUM 20 MG PO TBEC
20.0000 mg | DELAYED_RELEASE_TABLET | Freq: Every day | ORAL | Status: DC
  Administered 2014-03-09 – 2014-03-12 (×4): 20 mg via ORAL
  Filled 2014-03-09 (×4): qty 1

## 2014-03-09 MED ORDER — EPINEPHRINE 0.3 MG/0.3ML IJ SOAJ
0.3000 mg | Freq: Once | INTRAMUSCULAR | Status: AC
  Administered 2014-03-09: 0.3 mg via INTRAMUSCULAR
  Filled 2014-03-09: qty 0.3

## 2014-03-09 MED ORDER — FAMOTIDINE 20 MG PO TABS: 20.0000 mg | ORAL_TABLET | Freq: Two times a day (BID) | ORAL | Status: DC

## 2014-03-09 MED ORDER — EPINEPHRINE 0.3 MG/0.3ML IJ SOAJ: 0.3000 mg | Freq: Once | INTRAMUSCULAR | Status: DC

## 2014-03-09 MED ORDER — ASPIRIN 325 MG PO TABS
325.0000 mg | ORAL_TABLET | Freq: Every day | ORAL | Status: DC
  Administered 2014-03-09 – 2014-03-12 (×4): 325 mg via ORAL
  Filled 2014-03-09 (×4): qty 1

## 2014-03-09 MED ORDER — SODIUM CHLORIDE 0.9 % IV SOLN
INTRAVENOUS | Status: DC
  Administered 2014-03-09 – 2014-03-11 (×6): via INTRAVENOUS

## 2014-03-09 MED ORDER — DIPHENHYDRAMINE HCL 25 MG PO TABS: 25.0000 mg | ORAL_TABLET | Freq: Four times a day (QID) | ORAL | Status: DC

## 2014-03-09 MED ORDER — POLYVINYL ALCOHOL 1.4 % OP SOLN
1.0000 [drp] | Freq: Four times a day (QID) | OPHTHALMIC | Status: DC | PRN
  Filled 2014-03-09: qty 15

## 2014-03-09 MED ORDER — PREDNISONE 20 MG PO TABS: 60.0000 mg | ORAL_TABLET | Freq: Every day | ORAL | Status: DC

## 2014-03-09 MED ORDER — ALBUTEROL SULFATE (2.5 MG/3ML) 0.083% IN NEBU
5.0000 mg | INHALATION_SOLUTION | Freq: Once | RESPIRATORY_TRACT | Status: AC
  Administered 2014-03-09: 5 mg via RESPIRATORY_TRACT
  Filled 2014-03-09: qty 6

## 2014-03-09 MED ORDER — IPRATROPIUM-ALBUTEROL 0.5-2.5 (3) MG/3ML IN SOLN
3.0000 mL | RESPIRATORY_TRACT | Status: DC
  Administered 2014-03-09 – 2014-03-12 (×15): 3 mL via RESPIRATORY_TRACT
  Filled 2014-03-09 (×20): qty 3

## 2014-03-09 MED ORDER — ENOXAPARIN SODIUM 40 MG/0.4ML ~~LOC~~ SOLN
40.0000 mg | SUBCUTANEOUS | Status: DC
  Administered 2014-03-09 – 2014-03-12 (×4): 40 mg via SUBCUTANEOUS
  Filled 2014-03-09 (×4): qty 0.4

## 2014-03-09 MED ORDER — ONDANSETRON HCL 4 MG PO TABS: 4.0000 mg | ORAL_TABLET | Freq: Four times a day (QID) | ORAL | Status: DC | PRN

## 2014-03-09 MED ORDER — SODIUM CHLORIDE 0.9 % IJ SOLN
3.0000 mL | Freq: Two times a day (BID) | INTRAMUSCULAR | Status: DC
  Administered 2014-03-09 – 2014-03-10 (×3): 3 mL via INTRAVENOUS

## 2014-03-09 MED ORDER — SODIUM CHLORIDE 0.9 % IV BOLUS (SEPSIS)
1000.0000 mL | Freq: Once | INTRAVENOUS | Status: AC
  Administered 2014-03-09: 1000 mL via INTRAVENOUS

## 2014-03-09 MED ORDER — POTASSIUM CHLORIDE 10 MEQ/100ML IV SOLN
10.0000 meq | INTRAVENOUS | Status: AC
  Administered 2014-03-09: 10 meq via INTRAVENOUS
  Filled 2014-03-09: qty 100

## 2014-03-09 NOTE — ED Notes (Signed)
Pt remains unable to stand without shaking terribly. Pt continues to complain of pain to entire body. Remains very drowsy.

## 2014-03-09 NOTE — ED Provider Notes (Signed)
CSN: [id number]     Arrival date & time 03/08/14  2359 History   First MD Initiated Contact with Patient 03/09/14 0006     Chief Complaint  Patient presents with  . Allergic Reaction     (Consider location/radiation/quality/duration/timing/severity/associated sxs/prior Treatment) HPI 34 year old female presents to the emergency department via EMS after allergic reaction.  History is limited as patient is extremely somnolent and arouses only briefly with noxious stimuli.  Per EMS, patient complained of an insect biting/stinging her left eye just prior to calling 911.  No prior history of allergic reaction to stings.  She did not see the insect that stung her.  Patient is currently staying in a hotel.  She is 2 weeks postpartum from a spontaneous vaginal delivery.  Infant is reportedly staying with people at the hotel.  Patient complained of tightness in her throat and shortness of breath.  She received 50 of Zantac, 50 of Benadryl, 125 mg of Solu-Medrol, 10 mg of albuterol in 0.5 Atrovent as well as 0.3 mg of epinephrine.  EMS reports swelling to face has improved.  No hives noted.  Initial wheezing has resolved. Past Medical History  Diagnosis Date  . Bronchitis, acute   . Asthma   . Hypertension   . Drug abuse, cocaine type   . Drug abuse, marijuana    Past Surgical History  Procedure Laterality Date  . Hand surgery    . Appendectomy     Family History  Problem Relation Age of Onset  . Cancer Other   . Asthma Mother   . Asthma Father   . Depression Father   . Depression Sister   . Depression Brother   . Diabetes Paternal Grandmother    History  Substance Use Topics  . Smoking status: Current Every Day Smoker -- 0.25 packs/day    Types: Cigarettes  . Smokeless tobacco: Never Used  . Alcohol Use: Yes     Comment: staates alcohol at 3 months pregnat but none since   OB History    Gravida Para Term Preterm AB TAB SAB Ectopic Multiple Living   0 1     Review of  Systems Level V caveat secondary to somnolence   Allergies  Penicillins and Percocet  Home Medications   Prior to Admission medications   Medication Sig Start Date End Date Taking? Authorizing Provider  ibuprofen (ADVIL,MOTRIN) 600 MG tablet Take 1 tablet (600 mg total) by mouth every 6 (six) hours. 02/25/14   Jacklyn Shell, CNM   BP 139/75 mmHg  Pulse 82  Temp(Src) 98.4 F (36.9 C) (Oral)  Resp 20  SpO2 96%  Breastfeeding? Yes Physical Exam  Constitutional: She appears well-developed and well-nourished.  HENT:  Head: Normocephalic and atraumatic.  Nose: Nose normal.  Mouth/Throat: Oropharynx is clear and moist.  Patient has periorbital edema and conjunctival edema of the left eye.  Pupils are equal and reactive.  Extraocular muscles appear to be intact.  Patient is not very compliant with exam.  Eyes: Conjunctivae and EOM are normal. Pupils are equal, round, and reactive to light.  Neck: Normal range of motion. Neck supple. No JVD present. No tracheal deviation present. No thyromegaly present.  Patient has mild expiratory stridor  Cardiovascular: Normal rate, regular rhythm, normal heart sounds and intact distal pulses.  Exam reveals no gallop and no friction rub.   No murmur heard. Pulmonary/Chest: Effort normal. No stridor. No respiratory distress. She has wheezes. She has no  rales. She exhibits no tenderness.  Abdominal: Soft. She exhibits no distension and no mass. There is no tenderness. There is no rebound and no guarding.  Hyperactive bowel sounds  Musculoskeletal: Normal range of motion. She exhibits no edema or tenderness.  Lymphadenopathy:    She has no cervical adenopathy.  Neurological: She exhibits normal muscle tone. Coordination normal.  Skin: Skin is warm and dry. No rash noted. No erythema. No pallor.  Psychiatric: Judgment and thought content normal.  Nursing note and vitals reviewed.   ED Course  Procedures (including critical care  time) Labs Review Labs Reviewed  CBC WITH DIFFERENTIAL/PLATELET - Abnormal; Notable for the following:    Monocytes Relative 2 (*)    All other components within normal limits  BASIC METABOLIC PANEL - Abnormal; Notable for the following:    Potassium 3.4 (*)    Glucose, Bld 133 (*)    Calcium 8.3 (*)    GFR calc non Af Amer 66 (*)    GFR calc Af Amer 77 (*)    All other components within normal limits  URINE RAPID DRUG SCREEN (HOSP PERFORMED)    Imaging Review No results found.   EKG Interpretation   Date/Time:  Wednesday March 09 2014 00:10:20 EST Ventricular Rate:  63 PR Interval:  145 QRS Duration: 96 QT Interval:  473 QTC Calculation: 484 R Axis:   89 Text Interpretation:  Sinus rhythm with premature atrial contractions  S1,S2,S3 pattern unchanged from prior Anterior infarct, old Confirmed by  Delisa Finck  MD, Fadumo Heng ([id number]) on 03/09/2014 12:29:48 AM      MDM   Final diagnoses:  Acute allergic reaction, initial encounter   34 year old female with allergic reaction.  As she is still wheezing and having some stridor, will repeat epinephrine.  Plan to monitor.  Check labs, get EKG.  Patient has history of polysubstance abuse, unclear if her somnolence is secondary to the Benadryl given, poor sleeping with new newborn, are post cocaine somnolence.  2:12 AM Patient reassessed, she is still somnolent, but stridor and wheezing have resolved after second epi and third neb treatment.  Soft tissue swelling around left eye is much improved.  Will continue to monitor for 4 hours post epinephrine injection.  4:55 AM Patient slightly more awake now.  No further stridor or shortness of breath.  Swelling is much improved.  She reports no further itching.  She is wondering what bit her, but without finding the insect.  It is unclear this time what caused her allergic reaction.  Patient will be started on prednisone, Pepcid and Benadryl for home use over the next few days with referral to  allergy.  Olivia Mackielga M Abdulrahim Siddiqi, MD 03/09/14 [phone]

## 2014-03-09 NOTE — ED Notes (Signed)
Vomiting p/t ems arrival

## 2014-03-09 NOTE — H&P (Signed)
Date: 03/09/2014               Patient Name:  Nicole Mahoney MRN: [id number]  DOB: Feb 14, 1980 Age / Sex: 34 y.o., female   PCP: No Pcp Per Patient              Medical Service: Internal Medicine Teaching Service      Attending Physician: Dr. Gardiner Barefoot, MD    First Contact: Mosie Epstein, MS3 Pager: [phone]  Second Contact: Dr. Leatha Gilding Pager: [phone]  Third Contact Dr. Andrey Campanile Pager: [phone]       After Hours (After 5p/  First Contact Pager: [phone]  weekends / holidays): Second Contact Pager: [phone]   Chief Complaint: Allergic reaction  History of Present Illness: Nicole Mahoney is a 34 y.o. female with PMH significant for HTN and chronic substance abuse (cocaine and marijuana) who presented to the ED for acute allergic reaction. During the interview, pt was somnolent and lethargic, making it difficult to obtain entire hx accurately. Based on limited hx from pt and EMR, she was brought in by EMS for allergic rxn significant for swollen left eye, wheezing, and throat closing up. Initially pt was only arouseable to painful stimuli but she was arouseable to verbal stimuli during the interview. Pt states that last night she felt a sting in her left eye and it started swelling immediately. She started having difficulty breathing. She complains of generalized pain and malaise all over the body. States that she has CP that is on left lateral chest wall that is consistent and sharp. It radiates up to her neck on the left side. Pain is not alleviated or worsened by anything specific. She endorses SOB upon inspiration. Pt complains generalized weakness and numbness on her left UE and LE. She acknowledges that immediately prior to this allergic reaction, she was using cocaine intranasally. Denies recent alcohol use or other illicit drug use. Had one episode of emesis but unable to provide further details. Denies HA, abd pain, and diarrhea.   Pt was recently in the hospital on 02/23/14 for a  spontaneous vaginal delivery. Per EMR, delivery was uncomplicated with possible placental abruption noted on the cord. Baby was not discharged with mother due to social issues involving drug abuse. Pt states that she still has significant amount of blood clots and goes through "many pads" since the delivery. Denies melena or BRBPR. Denies SI, but unable to assess her mental status accurately at this time.  In the emergency department, pt was given 50 benadryl, 50 zantac, .3 epi IM, 10 albuterol, 0.5 Atrovent, 125 solumedrol upon arrival. Subsequently, she received another epi and two more neb treatments. Her left eye swelling and wheezing improved however she remained significantly somnolent this morning and was admitted to teaching service for further mgmt. UDS was positive for cocaine and initial troponin was negative. UA was negative. Imaging done while in ED: Head CT w/out contrast was negative. CXR showed patchy bibasilar atelectasis.   Meds: Current Facility-Administered Medications  Medication Dose Route Frequency Provider Last Rate Last Dose  . 0.9 %  sodium chloride infusion   Intravenous Continuous Genelle Gather, MD      . enoxaparin (LOVENOX) injection 40 mg  40 mg Subcutaneous Q24H Genelle Gather, MD      . ipratropium-albuterol (DUONEB) 0.5-2.5 (3) MG/3ML nebulizer solution 3 mL  3 mL Nebulization Q4H Genelle Gather, MD   3 mL at 03/09/14 1547  . ondansetron (ZOFRAN) tablet 4 mg  4 mg Oral Q6H PRN Genelle Gather, MD       Or  . ondansetron [facility]) injection 4 mg  4 mg Intravenous Q6H PRN Genelle Gather, MD      . potassium chloride 10 mEq in 100 mL IVPB  10 mEq Intravenous Q1 Hr x 4 Genelle Gather, MD      . sodium chloride 0.9 % injection 3 mL  3 mL Intravenous Q12H Genelle Gather, MD   3 mL at 03/09/14 1500    Allergies: Allergies as of 03/08/2014 - Review Complete 02/23/2014  Allergen Reaction Noted  . Penicillins Nausea And Vomiting 01/27/2011  . Percocet  [oxycodone-acetaminophen] Nausea And Vomiting 03/18/2011   Past Medical History  Diagnosis Date  . Bronchitis, acute   . Asthma   . Hypertension   . Drug abuse, cocaine type   . Drug abuse, marijuana    Past Surgical History  Procedure Laterality Date  . Hand surgery    . Appendectomy      Family History: Family History  Problem Relation Age of Onset  . Cancer Other   . Asthma Mother   . Asthma Father   . Depression Father   . Depression Sister   . Depression Brother   . Diabetes Paternal Grandmother     Social History; History   Social History  . Marital Status: Married    Spouse Name: N/A    Number of Children: N/A  . Years of Education: N/A   Occupational History  . Not on file.   Social History Main Topics  . Smoking status: Current Every Day Smoker -- 0.25 packs/day    Types: Cigarettes  . Smokeless tobacco: Never Used  . Alcohol Use: Yes     Comment: staates alcohol at 3 months pregnat but none since  . Drug Use: Yes    Special: Marijuana, Cocaine     Comment: positive UDS for cocaine & MJ 12/11/13  . Sexual Activity: Yes   Other Topics Concern  . Not on file   Social History Narrative    Review of Systems: Pertinent items are noted in HPI. All other pertinent ROS as stated in HPI.   Physical Exam: Blood pressure 124/77, pulse 62, temperature 98.4 F (36.9 C), temperature source Oral, resp. rate 19, SpO2 96 %, currently breastfeeding. Constitutional: Pt is somnolent but arouseable to voice. In significant discomfort and teary during parts of the exam. Nose: No erythema or drainage noted.  Turbinates normal Mouth: no erythema or exudates, MMM Eyes: PERRL, EOMI on right side, Could not assess EOMI on left eye very well due to pt unable to keep the left eye open. Photophobia and decreased visual acuity in left eye. Moderate periorbital swelling around left eye. Cardiovascular: RRR, S1 normal, S2 normal, no MRG Pulmonary/Chest: normal respiratory  effort, occasional wheezing, decreased breath sounds  Abdominal: Soft, diffusely tender, non-distended, bowel sounds are normal, no masses.  Neurological: Somnolent throughout the exam. Decreased strength and sensation in left UE and left LE. Normal strength and sensation on right extremities. Skin: Warm, dry and intact. No rash, cyanosis, or clubbing.  Psychiatric: Unable to assess.  Lab results: Basic Metabolic Panel:  Recent Labs Lab 03/09/14 0039  NA 139  K 3.4*  CL 108  CO2 25  GLUCOSE 133*  BUN 10  CREATININE 1.10  CALCIUM 8.3*   Liver Function Tests:  Recent Labs Lab 03/09/14 0835  AST 19  ALT 33  ALKPHOS 87  BILITOT 0.6  PROT 7.0  ALBUMIN 3.6   CBC:  Recent Labs Lab 03/09/14 0039  WBC 9.6  NEUTROABS 6.5  HGB 12.0  HCT 36.7  MCV 91.1  PLT 286   Cardiac Enzymes:  Recent Labs Lab 03/09/14 0835  TROPONINI <0.03   Urine Drug Screen: Drugs of Abuse     Component Value Date/Time   LABOPIA NONE DETECTED 03/09/2014 0456   COCAINSCRNUR POSITIVE* 03/09/2014 0456   LABBENZ NONE DETECTED 03/09/2014 0456   AMPHETMU NONE DETECTED 03/09/2014 0456   THCU NONE DETECTED 03/09/2014 0456   LABBARB NONE DETECTED 03/09/2014 0456    Urinalysis:  Recent Labs Lab 03/09/14 0456  COLORURINE YELLOW  LABSPEC 1.015  PHURINE 5.5  GLUCOSEU NEGATIVE  HGBUR NEGATIVE  BILIRUBINUR NEGATIVE  KETONESUR NEGATIVE  PROTEINUR NEGATIVE  UROBILINOGEN 0.2  NITRITE NEGATIVE  LEUKOCYTESUR NEGATIVE   Imaging results:  Ct Head Wo Contrast  03/09/2014   CLINICAL DATA:  Altered mental status and decreased level of consciousness.  EXAM: CT HEAD WITHOUT CONTRAST  TECHNIQUE: Contiguous axial images were obtained from the base of the skull through the vertex without intravenous contrast.  COMPARISON:  10/12/2013  FINDINGS: The brain demonstrates no evidence of hemorrhage, infarction, edema, mass effect, extra-axial fluid collection, hydrocephalus or mass lesion. The skull is  unremarkable.  IMPRESSION: Normal head CT.   Electronically Signed   By: Irish Lack M.D.   On: 03/09/2014 08:53   Mr Maxine Glenn Head Wo Contrast  03/09/2014   CLINICAL DATA:  Personal history of hypertension and chronic substance abuse. Acute allergic reaction. Somnolence. Lethargy. Mental status changes.  EXAM: MRI HEAD WITHOUT CONTRAST  MRA HEAD WITHOUT CONTRAST  TECHNIQUE: Multiplanar, multiecho pulse sequences of the brain and surrounding structures were obtained without intravenous contrast. Angiographic images of the head were obtained using MRA technique without contrast.  COMPARISON:  Head CT same day  FINDINGS: MRI HEAD FINDINGS  There is extensive artifact related to hair braids, making the parenchymal imaging nearly useless. There does not appear to be any brain mass, old infarction, identifiable hemorrhage, hydrocephalus or extra-axial collection. No pituitary mass. Sinuses, middle ears and mastoids are clear. No useful diffusion imaging.  MRA HEAD FINDINGS  Both internal carotid arteries are patent. There is considerable artifact. On the right, we can't determine that the middle cerebral artery and anterior cerebral artery are patent. On the left, there is more artifact. I think those vessels are patent on the left as well.  Both vertebral arteries are patent to the basilar. There is basilar flow. No branch vessel detail available.  IMPRESSION: Very degraded study due to artifact from unremovable hair braids. No brain parenchymal abnormality seen, with this limited information. I think major vessels show flow, but detail is very limited.   Electronically Signed   By: Paulina Fusi M.D.   On: 03/09/2014 15:11   Mr Brain Wo Contrast  03/09/2014   CLINICAL DATA:  Personal history of hypertension and chronic substance abuse. Acute allergic reaction. Somnolence. Lethargy. Mental status changes.  EXAM: MRI HEAD WITHOUT CONTRAST  MRA HEAD WITHOUT CONTRAST  TECHNIQUE: Multiplanar, multiecho pulse sequences of  the brain and surrounding structures were obtained without intravenous contrast. Angiographic images of the head were obtained using MRA technique without contrast.  COMPARISON:  Head CT same day  FINDINGS: MRI HEAD FINDINGS  There is extensive artifact related to hair braids, making the parenchymal imaging nearly useless. There does not appear to be any brain mass, old infarction, identifiable hemorrhage, hydrocephalus or extra-axial  collection. No pituitary mass. Sinuses, middle ears and mastoids are clear. No useful diffusion imaging.  MRA HEAD FINDINGS  Both internal carotid arteries are patent. There is considerable artifact. On the right, we can't determine that the middle cerebral artery and anterior cerebral artery are patent. On the left, there is more artifact. I think those vessels are patent on the left as well.  Both vertebral arteries are patent to the basilar. There is basilar flow. No branch vessel detail available.  IMPRESSION: Very degraded study due to artifact from unremovable hair braids. No brain parenchymal abnormality seen, with this limited information. I think major vessels show flow, but detail is very limited.   Electronically Signed   By: Paulina FusiMark  Shogry M.D.   On: 03/09/2014 15:11   Dg Chest Portable 1 View  03/09/2014   CLINICAL DATA:  Allergic reaction, shortness of breath, chest pain this morning  EXAM: PORTABLE CHEST - 1 VIEW  COMPARISON:  01/28/2011  FINDINGS: Low lung volumes. Patchy atelectasis or infiltrate at the lung bases, right greater than left. Heart size upper limits normal for technique. No pneumothorax. No effusion. Visualized skeletal structures are unremarkable.  IMPRESSION: 1. Low volumes. Patchy bibasilar atelectasis versus early infiltrates.   Electronically Signed   By: Oley Balmaniel  Hassell M.D.   On: 03/09/2014 08:36    Other results: EKG: 60 bpm, sinus rhythm, borderline prolonged QT interval  Assessment & Plan by Problem: Principal Problem:   Altered mental  status Active Problems:   Illicit drug use   Somnolence   Cocaine abuse   Acute allergic reaction   Decreased visual acuity   Left sided numbness   Nicole Mahoney is a 34 y.o. female with PMH significant for HTN and chronic substance abuse (cocaine and marijuana) who presented to the ED for an allergic rxn, currently somnolent and lethargic.  **Somnolent/AMS - Pt is still very somnolent along with significant L sided weakness and numbness. Head CT was negative but can't rule out small CVA event completely, so will need an MRI/MRA. Pt is about two weeks post-partum and could be just sleep derived or experiencing post cocaine vs. post benadryl somnolence however all these are less likely given the neurological deficits. Post partum bleeding less likely given pt is mostly normotensive and Hgb normal at 12. Infectious etiology is likely given pt is afebrile w/out leukocytosis. UDS only positive for cocaine but possible that pt may have ingested contaminated cocaine or other substances. - MRI/MRA negative; study was limited due to artifact from hair braids. - Will plan to admit her for observation and monitor vitals - Test for other ingested toxins? - IVF with NS - Monitor BMP  L sided weakness/numbess - Pt's sx were consistent with CVA event, however Head CT was negative and limited MRA & MRI imaging were both negative. TIA less likely but small embolic event is still possible. - Neuro checks q2h - Obtain further hx when pt is less somnolent - Repeat imaging if necessary  **L eye swelling - Possibly an allergic reaction to insect bite. Swelling has improved with Benadryl. However, give the photophobia, decreased acuity, and pain with left eye movements will need to rule out periorbital edema vs cellulitis. - Further benadryl may make pt more somnolent, will hold for now - Observe and recheck in few hours - Ophtho consult  **Drug abuse - Chronic cocaine use and hx of marijuana use. -  Monitor vitals - Watch for withdrawal sx: paranoia, depression, anxiety, formication - Counsel on  cessation/social work  Diet: Regular  Prophylaxis: SCDs Code: Full  Dispo: Disposition is deferred at this time, awaiting improvement of current medical problems. Anticipated discharge in approximately 2-3 day(s).   The patient does not have a current PCP (No Pcp Per Patient) and does need an [facility] hospital follow-up appointment after discharge.  The patient does not know have transportation limitations that hinder transportation to clinic appointments.  This is a Psychologist, occupational Note.  The care of the patient was discussed with Dr. Leatha Gilding and the assessment and plan was formulated with their assistance.  Please see their note for official documentation of the patient encounter.   Signed: Mosie Epstein, Med Student Internal Medicine Teaching Service Team B2  [phone] 03/09/2014, 1:54 PM

## 2014-03-09 NOTE — ED Notes (Addendum)
Pt arrives EMS laying in bed, stung by something, 50 benadryl, 50 zantac, .3 epi IM, 10 albuterol, 0.5 Atrovent, 125 solumedrol. Audible wheezing, sleepy at this time, arouseable with painful stimuli. Arouseable to painful stimuli. HR varies. Gave birth on 1/20. L eye initially swollen, states throat was closing up

## 2014-03-09 NOTE — H&P (Signed)
Date: 03/09/2014               Patient Name:  Nicole Mahoney MRN: [id number]  DOB: 12/24/1980 Age / Sex: 34 y.o., female   PCP: No Pcp Per Patient         Medical Service: Internal Medicine Teaching Service         Attending Physician: Dr. Gardiner Barefoot, MD    First Contact: Dr. Eleonore Chiquito Pager: [phone]  Second Contact: Dr. Evelena Peat Pager: [phone]       After Hours (After 5p/  First Contact Pager: [phone]  weekends / holidays): Second Contact Pager: [phone]   Chief Complaint: "insect bite" in eye last night   History of Present Illness: [name] is a 34 yo woman with a history of intranasal cocaine use and NDVD on 02/23/14 who presented to the [facility] ED with somnolence, facial edema and wheezing. She thought that she had been stung by an insect in her eye. Her swelling and wheezing greatly resolved with epinephrine, zantac, benadryl and solumedrol. However, the patient's somnolence persisted and she started to complain of full-body left-sided numbness/tingling. She also developed eye pain and blurred vision in the left eye. She admits to intranasal cocaine use yesterday, significant recent depression and nausea, but denies headache, chest pain, weakness or pain in her extremities. Since her spontaneous vaginal delivery, she has had heavy vaginal bleeding with clots.  Of note, the patient's baby was not discharged with her as a result of her persistent social issues and illicit drug use.  Meds: No current facility-administered medications for this encounter.   Current Outpatient Prescriptions  Medication Sig Dispense Refill  . ibuprofen (ADVIL,MOTRIN) 600 MG tablet Take 1 tablet (600 mg total) by mouth every 6 (six) hours. 30 tablet 0  . diphenhydrAMINE (BENADRYL) 25 MG tablet Take 1 tablet (25 mg total) by mouth every 6 (six) hours. 20 tablet 0  . EPINEPHrine 0.3 mg/0.3 mL IJ SOAJ injection Inject 0.3 mLs (0.3 mg total) into the muscle once. 1 Device 1  . famotidine  (PEPCID) 20 MG tablet Take 1 tablet (20 mg total) by mouth 2 (two) times daily. 30 tablet 0  . predniSONE (DELTASONE) 20 MG tablet Take 3 tablets (60 mg total) by mouth daily. 15 tablet 0    Allergies: Allergies as of 03/08/2014 - Review Complete 02/23/2014  Allergen Reaction Noted  . Penicillins Nausea And Vomiting 01/27/2011  . Percocet [oxycodone-acetaminophen] Nausea And Vomiting 03/18/2011   Past Medical History  Diagnosis Date  . Bronchitis, acute   . Asthma   . Hypertension   . Drug abuse, cocaine type   . Drug abuse, marijuana    Past Surgical History  Procedure Laterality Date  . Hand surgery    . Appendectomy     Family History  Problem Relation Age of Onset  . Cancer Other   . Asthma Mother   . Asthma Father   . Depression Father   . Depression Sister   . Depression Brother   . Diabetes Paternal Grandmother    History   Social History  . Marital Status: Married    Spouse Name: N/A    Number of Children: N/A  . Years of Education: N/A   Occupational History  . Not on file.   Social History Main Topics  . Smoking status: Current Every Day Smoker -- 0.25 packs/day    Types: Cigarettes  . Smokeless tobacco: Never Used  . Alcohol Use: Yes  Comment: staates alcohol at 3 months pregnat but none since  . Drug Use: Yes    Special: Marijuana, Cocaine     Comment: positive UDS for cocaine & MJ 12/11/13  . Sexual Activity: Yes   Other Topics Concern  . Not on file   Social History Narrative    Review of Systems: Pertinent items are noted in HPI.  Physical Exam: Blood pressure 124/77, pulse 62, temperature 98.4 F (36.9 C), temperature source Oral, resp. rate 19, SpO2 96 %, currently breastfeeding. Appearance: sleeping, in NAD, then very resistant to exam when awoken HEENT: AT/[place], PERRL, persistent periorbital edema OS, pain on EOM OS but patient is able to make full movements, photophobia OS, slight conjunctival injection OS Heart: RRR, normal  S1S2, no MRG Lungs: bronchial breath sounds, occasional faint expiratory wheezing, no increased work of breathing Abdomen: BS+, soft, post-partum abdomen, diffusely tender to palpation Musculoskeletal: full range of motion Neurologic: patient states that sensation is decreased in V1, UE and LE on the left Skin: no rashes or lesions   Lab results: Basic Metabolic Panel:  Recent Labs  16/11/9600/03/16 0039  NA 139  K 3.4*  CL 108  CO2 25  GLUCOSE 133*  BUN 10  CREATININE 1.10  CALCIUM 8.3*   Liver Function Tests:  Recent Labs  03/09/14 0835  AST 19  ALT 33  ALKPHOS 87  BILITOT 0.6  PROT 7.0  ALBUMIN 3.6   CBC:  Recent Labs  03/09/14 0039  WBC 9.6  NEUTROABS 6.5  HGB 12.0  HCT 36.7  MCV 91.1  PLT 286   Cardiac Enzymes:  Recent Labs  03/09/14 0835  TROPONINI <0.03   Urine Drug Screen: Drugs of Abuse     Component Value Date/Time   LABOPIA NONE DETECTED 03/09/2014 0456   COCAINSCRNUR POSITIVE* 03/09/2014 0456   LABBENZ NONE DETECTED 03/09/2014 0456   AMPHETMU NONE DETECTED 03/09/2014 0456   THCU NONE DETECTED 03/09/2014 0456   LABBARB NONE DETECTED 03/09/2014 0456    Urinalysis:  Recent Labs  03/09/14 0456  COLORURINE YELLOW  LABSPEC 1.015  PHURINE 5.5  GLUCOSEU NEGATIVE  HGBUR NEGATIVE  BILIRUBINUR NEGATIVE  KETONESUR NEGATIVE  PROTEINUR NEGATIVE  UROBILINOGEN 0.2  NITRITE NEGATIVE  LEUKOCYTESUR NEGATIVE   Imaging results:  Ct Head Wo Contrast  03/09/2014   CLINICAL DATA:  Altered mental status and decreased level of consciousness.  EXAM: CT HEAD WITHOUT CONTRAST  TECHNIQUE: Contiguous axial images were obtained from the base of the skull through the vertex without intravenous contrast.  COMPARISON:  10/12/2013  FINDINGS: The brain demonstrates no evidence of hemorrhage, infarction, edema, mass effect, extra-axial fluid collection, hydrocephalus or mass lesion. The skull is unremarkable.  IMPRESSION: Normal head CT.   Electronically Signed   By:  Irish LackGlenn  Yamagata M.D.   On: 03/09/2014 08:53   Dg Chest Portable 1 View  03/09/2014   CLINICAL DATA:  Allergic reaction, shortness of breath, chest pain this morning  EXAM: PORTABLE CHEST - 1 VIEW  COMPARISON:  01/28/2011  FINDINGS: Low lung volumes. Patchy atelectasis or infiltrate at the lung bases, right greater than left. Heart size upper limits normal for technique. No pneumothorax. No effusion. Visualized skeletal structures are unremarkable.  IMPRESSION: 1. Low volumes. Patchy bibasilar atelectasis versus early infiltrates.   Electronically Signed   By: Oley Balmaniel  Hassell M.D.   On: 03/09/2014 08:36    Other results: EKG: Rate 63, NSR, premature atrial contractions, border line prolonged qtc 489  Assessment & Plan by Problem:  Principal Problem:   Altered mental status Active Problems:   Illicit drug use   Somnolence   Cocaine abuse   Acute allergic reaction   Decreased visual acuity   Left sided numbness  Ms. Clements is a 34 yo recently post-partum woman who uses intranasal cocaine who was treated overnight in the ED for an allergic reaction that involved facial swelling and wheezes, but was admitted due to her persistent somnolence, left-sided numbness/ tingling and left eye pain, decreased acuity, injection and photophobia.  AMS: Patient continues to be somnolent on exam, but became more alert, was able to drink water while observed, and ate dinner. - Continue to monitor - Continue to assess for depression and withdrawal  - Could consider other ingestions, but patient denies - IVF NS 150 ml/hr  Left-Sided Full Body Numbness/Tingling: Patient's numbness has persisted on the left side of her body and in V1 on the left; however, CT had no acute findings and MRI was inconclusive (artifact). Could be experiencing a CVA/TIA, has risk factor of cocaine use, but less likely given negative CT (and negative MRI from what was interpretable). - Continue to monitor  Periorbital Edema, Pain on  Movement, Decreased Acuity, Conjunctival Injection and Photophobia OS: Patient's left eye symptoms are general, but could be signs of corneal abrasion, iritis, uveitis, preseptal celluitis, or orbital cellulitis. Patients who use intranasal cocaine are certainly at greater risk for cellulitis. Patient believes she was bit by an insect in that eye (though it was never seen). - Ophthalmology made aware of patient and will see her in the hospital tomorrow or in clinic if she is discharged; concerning signs would be increased erythema, beefy redness to the conjunctiva, more pain or difficulty with movement of the eye - Artificial tears QID PRN  Acute Allergic Reaction: Appears to be mostly resolved with ED treatment. Some persistent left periorbital edema which may or may not be related. Must consider cocaine contaminants.  - Continue to monitor - Duonebs q4 hours  - Zofran as needed  Polysubstance Abuse: Cocaine (intranasal) and marijuana. Cocaine + on UDS. - Offer cessation counseling  Diet: Regular diet  DVT Ppx: SCDs - Lovenox [place]  Dispo: Disposition is deferred at this time, awaiting improvement of current medical problems. Anticipated discharge in approximately 2 day(s).   The patient does not have a current PCP (No Pcp Per Patient) and does not know need an [facility] hospital follow-up appointment after discharge.  The patient does not have transportation limitations that hinder transportation to clinic appointments.  Signed: Dionne Ano, MD 03/09/2014, 12:25 PM

## 2014-03-09 NOTE — Progress Notes (Signed)
Pt admitted to the unit at 2:58pm. Pt is very lethargic and only wakes up for a few seconds. Unable to orient patient to room as she is too lethargic. Introduced myself to pt. Skin is intact. Full assessment charted in [facility]. Call bell within reach.

## 2014-03-09 NOTE — ED Notes (Signed)
CALLED [facility]. THEY WILL TAKE PT TO HER BED ON 5 W

## 2014-03-09 NOTE — ED Notes (Addendum)
Attempted to ambulate pt with RN Shawna OrleansMelanie. Pt gait unsteady, unable to balance. Very shaky. Trembling when standing. Very high risk for falling at this time. Dr. Norlene Campbelltter aware.

## 2014-03-09 NOTE — ED Provider Notes (Signed)
Care assumed from Dr. Norlene Campbelltter. Patient presented with allergic reaction from some kind of stinging last night. She received epinephrine, Zantac, Benadryl, solu- Medrol. She remains somnolent and obtunded. There is concern for drug use. Patient is 2 weeks postpartum.  LFTs normal. UA normal. No proteinuria. Blood pressure improving 143/88. Patient remains somnolent.  Discussed with Dr. Erin FullingHarraway-Smith of obstetrics. BP has improved. No proteinuria or LFT elevation.  Dr. Erin FullingHarraway Smith feels that mental status changes are not due to preelampsia or eclampsia. There has been no reported seizure activity.  Elevated blood pressure expected in setting of cocaine and epinephrine use.  CT head negative. Patient unable to stand without assistance and difficulty walking. She c/o pain all over. She'll be admitted for further management and evaluation. Discussed with teaching service.  Glynn OctaveStephen Kamie Korber, MD 03/09/14 [phone]

## 2014-03-09 NOTE — ED Notes (Addendum)
Portable x-ray being performed at bedside at this time. EKG completed also. Pt complaining of "needle" like pain in central/left chest. Pt states SOB also.

## 2014-03-09 NOTE — ED Notes (Signed)
[facility].

## 2014-03-09 NOTE — ED Notes (Addendum)
[facility] Charity fundraiserN.  [id number]

## 2014-03-09 NOTE — ED Notes (Addendum)
Attempted to ambulate patient to restroom but patients gait was unsteady and she stated she was very dizzy. Assited patient into wheelchair the rest of the way to the restroom. Dr. Norlene Campbelltter informed. After patient finished using the restroom patient brought back to room and placed back in the bed. Pt very drowsy still and immediately fell back to sleep but she is easily aroused with verbal stimulation.

## 2014-03-09 NOTE — ED Notes (Addendum)
2 person assist needed to stand pt. During orthostatic vital signs, performed with NT Sarah, pt gait more unsteady than before. Pt complaining of legs hurting. Very shaky once again when standing and with movement. When pt lying in bed at rest, no shaking noted.

## 2014-03-09 NOTE — ED Notes (Signed)
Teaching service at bedside.  

## 2014-03-10 DIAGNOSIS — R400 Somnolence: Secondary | ICD-10-CM | POA: Diagnosis not present

## 2014-03-10 DIAGNOSIS — F0630 Mood disorder due to known physiological condition, unspecified: Secondary | ICD-10-CM

## 2014-03-10 DIAGNOSIS — H5712 Ocular pain, left eye: Secondary | ICD-10-CM | POA: Diagnosis not present

## 2014-03-10 DIAGNOSIS — R062 Wheezing: Secondary | ICD-10-CM | POA: Diagnosis not present

## 2014-03-10 DIAGNOSIS — F1994 Other psychoactive substance use, unspecified with psychoactive substance-induced mood disorder: Secondary | ICD-10-CM

## 2014-03-10 DIAGNOSIS — T63441A Toxic effect of venom of bees, accidental (unintentional), initial encounter: Secondary | ICD-10-CM | POA: Diagnosis not present

## 2014-03-10 MED ORDER — ACETAMINOPHEN 325 MG PO TABS
650.0000 mg | ORAL_TABLET | Freq: Four times a day (QID) | ORAL | Status: DC | PRN
  Administered 2014-03-10 – 2014-03-12 (×2): 650 mg via ORAL
  Filled 2014-03-10 (×2): qty 2

## 2014-03-10 NOTE — Progress Notes (Signed)
Pt states she feels depressed, attempted suicide 2 weeks ago, tried to overdose on pills. Pt said she had thoughts of committing suicide a week ago with no plan.

## 2014-03-10 NOTE — Consult Note (Signed)
[facility] Face-to-Face Psychiatry Consult   Reason for Consult:  Depression, cocaine abuse and suicide ideation Referring Physician:  Family medicine   Patient Identification: Nicole Mahoney MRN:  [id number] Principal Diagnosis: Psychoactive substance-induced mood disorder Diagnosis:   Patient Active Problem List   Diagnosis Date Noted  . Psychoactive substance-induced mood disorder [F19.94, F06.30] 03/10/2014  . Altered mental status [R41.82] 03/09/2014  . Somnolence [R40.0] 03/09/2014  . Cocaine abuse [F14.10]   . Acute allergic reaction [T78.40XA]   . Decreased visual acuity [H54.7]   . Left sided numbness [R20.0]   . Drug abuse [F19.10]   . Non-reactive NST (non-stress test) [O28.9]   . [redacted] weeks gestation of pregnancy [Z3A.37]   . Fetal distress [O77.9] 02/22/2014  . [redacted] weeks gestation of pregnancy [Z3A.27]   . Abdominal trauma [S39.91XA]   . Illicit drug use [F19.90]   . Insufficient prenatal care [O09.30]     Total Time spent with patient: 45 minutes  Subjective:   Nicole Mahoney is a 34 y.o. female patient admitted with substance intoxication and depression.  History of Present Illness: Nicole Mahoney is a 34 years old female seen, chart reviewed and case discussed with staff RN for psychiatric consultation and evaluation of substance abuse, depression and suicidal thoughts. Patient reported she came from [place] to [place] about 3 years ago and staying with her baby's father. Reportedly patient does not get along with her baby's father and also endorses using intranasal cocaine since he was 34 years old. Patient also reported she drinks alcohol occasionally and smokes half pack per day of tobacco. 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 Patient reported she has a multiple family members with the drug of abuse. Patient does not have any relationship with her family members. Reportedly patient given bir  to a baby on 02/23/2014. Reportedly new one baby was given to her aunt and her 1059 months old baby was given to baby's father. Patient is interested to get treatment for substance abuse and also wishes to get her babies. Patient denies previous history of acute psychiatric hospitalization, suicidal attempts and family history of suicide attempts. Patient contract for safety while in the hospital reportedly had previous suicidal attempt was 2 weeks ago by overdose but refused to give the details. Patient also reported she does not have any prescription medications with her.  Mental history: Nicole Mahoney is a 34 yo woman with a history of intranasal cocaine use and NDVD on 02/23/14 who [facility] with somnolence, facial edema and wheezing. She thought that she had been stung by an insect in her eye. Her swelling and wheezing greatly resolved with epinephrine, zantac, benadryl and solumedrol. However, the patient's somnolence persisted and she started to complain of full-body left-sided numbness/tingling. She also developed eye pain and blurred vision in the left eye. She admits to intranasal cocaine use yesterday, significant recent depression and nausea, but denies headache, chest pain, weakness or pain in her extremities. Since her spontaneous vaginal delivery, she has had heavy vaginal bleeding with clots. Of note, the patient's baby was not discharged with her as a result of her persistent social issues and illicit drug use.  HPI Elements:   Location:  Substance abuse and depression. Quality:  Poor. Severity:  Suicidal thoughts. Timing:  Substance abuse and relationship problems. Duration:  Postpartum.. Context:  Multiple psychosocial stressors and relationship problems and substance abuse.  Past Medical History:  Past Medical History  Diagnosis Date  . Bronchitis, acute   . Asthma   . Hypertension   . Drug abuse, cocaine type   . Drug abuse, marijuana     Past Surgical History   Procedure Laterality Date  . Hand surgery    . Appendectomy     Family History:  Family History  Problem Relation Age of Onset  . Cancer Other   . Asthma Mother   . Asthma Father   . Depression Father   . Depression Sister   . Depression Brother   . Diabetes Paternal Grandmother    Social History:  History  Alcohol Use  . Yes    Comment: staates alcohol at 3 months pregnat but none since     History  Drug Use  . Yes  . Special: Marijuana, Cocaine    Comment: positive UDS for cocaine & MJ 12/11/13    History   Social History  . Marital Status: Married    Spouse Name: N/A    Number of Children: N/A  . Years of Education: N/A   Social History Main Topics  . Smoking status: Current Every Day Smoker -- 0.25 packs/day    Types: Cigarettes  . Smokeless tobacco: Never Used  . Alcohol Use: Yes     Comment: staates alcohol at 3 months pregnat but none since  . Drug Use: Yes    Special: Marijuana, Cocaine     Comment: positive UDS for cocaine & MJ 12/11/13  . Sexual Activity: Yes   Other Topics Concern  . None   Social History Narrative   Additional Social History:        Allergies:   Allergies  Allergen Reactions  . Penicillins Nausea And Vomiting  . Percocet [Oxycodone-Acetaminophen] Nausea And Vomiting    Vitals: Blood pressure 147/72, pulse 56, temperature 98.1 F (36.7 C), temperature source Oral, resp. rate 18, SpO2 97 %, currently breastfeeding.  Risk to Self: Is patient at risk for suicide?: No Risk to Others:   Prior Inpatient Therapy:   Prior Outpatient Therapy:    Current Facility-Administered Medications  Medication Dose Route Frequency Provider Last Rate Last Dose  . 0.9 %  sodium chloride infusion   Intravenous Continuous Genelle Gather, MD 150 mL/hr at 03/10/14 [phone]    . aspirin tablet 325 mg  325 mg Oral Daily Genelle Gather, MD   325 mg at 03/10/14 0912  . enoxaparin (LOVENOX) injection 40 mg  40 mg Subcutaneous Q24H Genelle Gather,  MD   40 mg at 03/09/14 1751  . ipratropium-albuterol (DUONEB) 0.5-2.5 (3) MG/3ML nebulizer solution 3 mL  3 mL Nebulization Q4H Genelle Gather, MD   3 mL at 03/10/14 0859  . ondansetron (ZOFRAN) tablet 4 mg  4 mg Oral Q6H PRN Genelle Gather, MD       Or  . ondansetron [facility]) injection 4 mg  4 mg Intravenous Q6H PRN Genelle Gather, MD      . pantoprazole (PROTONIX) EC tablet 20 mg  20 mg Oral Daily Genelle Gather, MD   20 mg at 03/10/14 0912  . polyvinyl alcohol (LIQUIFILM TEARS) 1.4 % ophthalmic solution 1 drop  1 drop Left Eye QID PRN Dionne Ano, MD      . sodium chloride 0.9 % injection 3 mL  3 mL Intravenous Q12H Genelle Gather, MD   3 mL at 03/10/14 0912    Musculoskeletal: Strength & Muscle Tone: within normal limits Gait &  Station: unable to stand Patient leans: N/A  Psychiatric Specialty Exam: Physical Exam as per history and physical   ROS depression, irritability, anger outbursts and substance abuse   Blood pressure 147/72, pulse 56, temperature 98.1 F (36.7 C), temperature source Oral, resp. rate 18, SpO2 97 %, currently breastfeeding.There is no weight on file to calculate BMI.  General Appearance: Guarded  Eye Contact::  Fair  Speech:  Clear and Coherent  Volume:  Decreased  Mood:  Angry, Depressed, Hopeless and Worthless  Affect:  Appropriate and Congruent  Thought Process:  Coherent and Goal Directed  Orientation:  Full (Time, Place, and Person)  Thought Content:  WDL  Suicidal Thoughts:  Yes.  without intent/plan  Homicidal Thoughts:  No  Memory:  Immediate;   Fair Recent;   Fair  Judgement:  Impaired  Insight:  Lacking  Psychomotor Activity:  Decreased  Concentration:  Good  Recall:  Good  Fund of Knowledge:Good  Language: Good  Akathisia:  NA  Handed:  Right  AIMS (if indicated):     Assets:  Communication Skills Desire for Improvement Leisure Time Physical Health Resilience Social Support Talents/Skills  ADL's:  Impaired  Cognition: WNL   Sleep:      Medical Decision Making: New problem, with additional work up planned, Review of Psycho-Social Stressors (1), Review or order clinical lab tests (1), Established Problem, Worsening (2), Review or order medicine tests (1), Review of Medication Regimen & Side Effects (2) and Review of New Medication or Change in Dosage (2)  Treatment Plan Summary: Daily contact with patient to assess and evaluate symptoms and progress in treatment and Medication management  Plan:  Recommend psychiatric Inpatient admission when medically cleared. Supportive therapy provided about ongoing stressors.   Disposition: Patient benefit from residential substance abuse rehabilitation treatment and medically stable.   Sweetie Giebler,JANARDHAHA R. 03/10/2014 11:06 AM

## 2014-03-10 NOTE — Progress Notes (Signed)
Pt converted to A. Fib, with hr of 73, MD made aware. No new orders given, will continue to monitor.

## 2014-03-10 NOTE — Progress Notes (Signed)
UR completed 

## 2014-03-10 NOTE — Progress Notes (Signed)
Subjective: Nicole Mahoney feels much better today, but feels like her whole body hurts and her left face, arm and leg have no sensation and no strength. Her eye is still painful when she moves it and the light still bothers her. She admits to feeling depressed and to suicide attempt using multiple percocet pills prior to being pregnant. She denies current SI or HI.  Objective: Vital signs in last 24 hours: Filed Vitals:   03/10/14 0501 03/10/14 0900 03/10/14 1157 03/10/14 1339  BP: 147/72   137/72  Pulse: 56     Temp: 98.1 F (36.7 C)   98.9 F (37.2 C)  TempSrc: Oral   Oral  Resp: 18   16  SpO2: 96% 97% 97% 99%   Weight change:   Intake/Output Summary (Last 24 hours) at 03/10/14 1444 Last data filed at 03/10/14 1351  Gross per 24 hour  Intake   3533 ml  Output   1600 ml  Net   1933 ml   Physical Exam: Appearance: sleeping, in NAD, then much more alert than on yesterday's exam, whining HEENT: AT/[place], PERRL, persistent periorbital edema OS, pain on EOM OS but patient is able to make full movements, photophobia OS, slight conjunctival injection OS, unchanged exam from yesterday Heart: RRR, normal S1S2, no MRG Lungs: bronchial breath sounds, occasional faint expiratory wheezing, no increased work of breathing Abdomen: BS+, soft, post-partum abdomen, diffusely tender to palpation Musculoskeletal: full range of motion Neurologic: patient states that sensation is decreased in V1, UE and LE on the left, strength appears decreased on extremities only on that side, but seems to be volitional Skin: no rashes or lesions  Lab Results: Basic Metabolic Panel:  Recent Labs Lab 03/09/14 0039  NA 139  K 3.4*  CL 108  CO2 25  GLUCOSE 133*  BUN 10  CREATININE 1.10  CALCIUM 8.3*   Liver Function Tests:  Recent Labs Lab 03/09/14 0835  AST 19  ALT 33  ALKPHOS 87  BILITOT 0.6  PROT 7.0  ALBUMIN 3.6   CBC:  Recent Labs Lab 03/09/14 0039  WBC 9.6  NEUTROABS 6.5  HGB 12.0    HCT 36.7  MCV 91.1  PLT 286   Cardiac Enzymes:  Recent Labs Lab 03/09/14 0835  TROPONINI <0.03   Urine Drug Screen: Drugs of Abuse     Component Value Date/Time   LABOPIA NONE DETECTED 03/09/2014 0456   COCAINSCRNUR POSITIVE* 03/09/2014 0456   LABBENZ NONE DETECTED 03/09/2014 0456   AMPHETMU NONE DETECTED 03/09/2014 0456   THCU NONE DETECTED 03/09/2014 0456   LABBARB NONE DETECTED 03/09/2014 0456    Urinalysis:  Recent Labs Lab 03/09/14 0456  COLORURINE YELLOW  LABSPEC 1.015  PHURINE 5.5  GLUCOSEU NEGATIVE  HGBUR NEGATIVE  BILIRUBINUR NEGATIVE  KETONESUR NEGATIVE  PROTEINUR NEGATIVE  UROBILINOGEN 0.2  NITRITE NEGATIVE  LEUKOCYTESUR NEGATIVE    Studies/Results: Ct Head Wo Contrast  03/09/2014   CLINICAL DATA:  Altered mental status and decreased level of consciousness.  EXAM: CT HEAD WITHOUT CONTRAST  TECHNIQUE: Contiguous axial images were obtained from the base of the skull through the vertex without intravenous contrast.  COMPARISON:  10/12/2013  FINDINGS: The brain demonstrates no evidence of hemorrhage, infarction, edema, mass effect, extra-axial fluid collection, hydrocephalus or mass lesion. The skull is unremarkable.  IMPRESSION: Normal head CT.   Electronically Signed   By: Irish Lack M.D.   On: 03/09/2014 08:53   Mr Maxine Glenn Head Wo Contrast  03/09/2014   CLINICAL DATA:  Personal history of hypertension and chronic substance abuse. Acute allergic reaction. Somnolence. Lethargy. Mental status changes.  EXAM: MRI HEAD WITHOUT CONTRAST  MRA HEAD WITHOUT CONTRAST  TECHNIQUE: Multiplanar, multiecho pulse sequences of the brain and surrounding structures were obtained without intravenous contrast. Angiographic images of the head were obtained using MRA technique without contrast.  COMPARISON:  Head CT same day  FINDINGS: MRI HEAD FINDINGS  There is extensive artifact related to hair braids, making the parenchymal imaging nearly useless. There does not appear to be any  brain mass, old infarction, identifiable hemorrhage, hydrocephalus or extra-axial collection. No pituitary mass. Sinuses, middle ears and mastoids are clear. No useful diffusion imaging.  MRA HEAD FINDINGS  Both internal carotid arteries are patent. There is considerable artifact. On the right, we can't determine that the middle cerebral artery and anterior cerebral artery are patent. On the left, there is more artifact. I think those vessels are patent on the left as well.  Both vertebral arteries are patent to the basilar. There is basilar flow. No branch vessel detail available.  IMPRESSION: Very degraded study due to artifact from unremovable hair braids. No brain parenchymal abnormality seen, with this limited information. I think major vessels show flow, but detail is very limited.   Electronically Signed   By: Paulina FusiMark  Shogry M.D.   On: 03/09/2014 15:11   Mr Brain Wo Contrast  03/09/2014   CLINICAL DATA:  Personal history of hypertension and chronic substance abuse. Acute allergic reaction. Somnolence. Lethargy. Mental status changes.  EXAM: MRI HEAD WITHOUT CONTRAST  MRA HEAD WITHOUT CONTRAST  TECHNIQUE: Multiplanar, multiecho pulse sequences of the brain and surrounding structures were obtained without intravenous contrast. Angiographic images of the head were obtained using MRA technique without contrast.  COMPARISON:  Head CT same day  FINDINGS: MRI HEAD FINDINGS  There is extensive artifact related to hair braids, making the parenchymal imaging nearly useless. There does not appear to be any brain mass, old infarction, identifiable hemorrhage, hydrocephalus or extra-axial collection. No pituitary mass. Sinuses, middle ears and mastoids are clear. No useful diffusion imaging.  MRA HEAD FINDINGS  Both internal carotid arteries are patent. There is considerable artifact. On the right, we can't determine that the middle cerebral artery and anterior cerebral artery are patent. On the left, there is more  artifact. I think those vessels are patent on the left as well.  Both vertebral arteries are patent to the basilar. There is basilar flow. No branch vessel detail available.  IMPRESSION: Very degraded study due to artifact from unremovable hair braids. No brain parenchymal abnormality seen, with this limited information. I think major vessels show flow, but detail is very limited.   Electronically Signed   By: Paulina FusiMark  Shogry M.D.   On: 03/09/2014 15:11   Dg Chest Portable 1 View  03/09/2014   CLINICAL DATA:  Allergic reaction, shortness of breath, chest pain this morning  EXAM: PORTABLE CHEST - 1 VIEW  COMPARISON:  01/28/2011  FINDINGS: Low lung volumes. Patchy atelectasis or infiltrate at the lung bases, right greater than left. Heart size upper limits normal for technique. No pneumothorax. No effusion. Visualized skeletal structures are unremarkable.  IMPRESSION: 1. Low volumes. Patchy bibasilar atelectasis versus early infiltrates.   Electronically Signed   By: Oley Balmaniel  Hassell M.D.   On: 03/09/2014 08:36   Medications: I have reviewed the patient's current medications. Scheduled Meds: . aspirin  325 mg Oral Daily  . enoxaparin (LOVENOX) injection  40 mg Subcutaneous Q24H  .  ipratropium-albuterol  3 mL Nebulization Q4H  . pantoprazole  20 mg Oral Daily  . sodium chloride  3 mL Intravenous Q12H   Continuous Infusions: . sodium chloride 150 mL/hr at 03/10/14 0738   PRN Meds:.ondansetron **OR** ondansetron (ZOFRAN) IV, polyvinyl alcohol Assessment/Plan: Principal Problem:   Psychoactive substance-induced mood disorder Active Problems:   Illicit drug use   Altered mental status   Somnolence   Cocaine abuse   Acute allergic reaction   Decreased visual acuity   Left sided numbness  Nicole Mahoney is a 34 yo recently post-partum woman who uses intranasal cocaine who was treated overnight in the ED for an allergic reaction that involved facial swelling and wheezes, but was admitted due to her  persistent somnolence, left-sided numbness/ tingling / weakness and left eye pain, decreased acuity, injection and photophobia.  Depression and Prior Suicide Attempt: Patient is post-partum (2 weeks) and has multiple social stressors. Her baby was taken from her at birth. She appears depressed and admits to depressed mood. Denies current SI or HI. - Appreciate psychiatry consult  Resolved AMS: Patient is much more alert today. - Continue to monitor - Continue to assess for depression and withdrawal  - Could consider other ingestions, but patient denies - IVF NS 150 ml/hr  Left-Sided Full Body Numbness/Tingling/Weakness: Patient's numbness has persisted on the left side of her body and in V1 on the left; however, CT had no acute findings and MRI was inconclusive (artifact) and her complaints cannot be fit within a neurologic region. Appears to be psychiatric or volitional on exam. - Continue to monitor - PT to evaluate and treat  Periorbital Edema, Pain on Movement, Decreased Acuity, Conjunctival Injection and Photophobia OS: Patient's left eye symptoms are common findings, but could be signs of corneal abrasion, iritis, uveitis, preseptal celluitis, or orbital cellulitis. Patients who use intranasal cocaine are certainly at greater risk for cellulitis. Patient believes she was bit by an insect in that eye (though it was never seen). Ophthalmology was made aware of patient yesterday and checked in for follow-up exam today. Would like to see her as outpatient (any day that she is d/c prior to 3:30PM). Concerning signs would be increased erythema, beefy redness to the conjunctiva, more pain or difficulty with movement of the eye - Artificial tears QID PRN - Cold compresses  Acute Allergic Reaction: Appears to be mostly resolved with ED treatment. Some persistent left periorbital edema which may or may not be related. Must consider cocaine contaminants.  - Continue to monitor - Duonebs q4 hours   - Zofran as needed  Polysubstance Abuse: Cocaine (intranasal) and marijuana. Cocaine + on UDS. - Offer cessation counseling  Diet: Regular diet  Dispo: Disposition is deferred at this time, awaiting improvement of current medical problems.  Anticipated discharge in approximately 1 day(s).   The patient does not have a current PCP (No Pcp Per Patient) and does not need an [facility] hospital follow-up appointment after discharge.  The patient does have transportation limitations that hinder transportation to clinic appointments.  .Services Needed at time of discharge: Y = Yes, Blank = No PT:   OT:   RN:   Equipment:   Other:     LOS: 2 days   Dionne Ano, MD 03/10/2014, 2:44 PM

## 2014-03-11 DIAGNOSIS — F1414 Cocaine abuse with cocaine-induced mood disorder: Secondary | ICD-10-CM

## 2014-03-11 DIAGNOSIS — H05229 Edema of unspecified orbit: Secondary | ICD-10-CM

## 2014-03-11 DIAGNOSIS — R202 Paresthesia of skin: Secondary | ICD-10-CM

## 2014-03-11 DIAGNOSIS — H53142 Visual discomfort, left eye: Secondary | ICD-10-CM

## 2014-03-11 DIAGNOSIS — R531 Weakness: Secondary | ICD-10-CM

## 2014-03-11 DIAGNOSIS — H547 Unspecified visual loss: Secondary | ICD-10-CM

## 2014-03-11 MED ORDER — ALBUTEROL SULFATE (2.5 MG/3ML) 0.083% IN NEBU
2.5000 mg | INHALATION_SOLUTION | RESPIRATORY_TRACT | Status: DC | PRN
  Administered 2014-03-11: 2.5 mg via RESPIRATORY_TRACT
  Filled 2014-03-11: qty 3

## 2014-03-11 MED ORDER — POLYVINYL ALCOHOL 1.4 % OP SOLN
1.0000 [drp] | Freq: Four times a day (QID) | OPHTHALMIC | Status: DC
  Administered 2014-03-11 – 2014-03-12 (×6): 1 [drp] via OPHTHALMIC
  Filled 2014-03-11 (×2): qty 15

## 2014-03-11 NOTE — Progress Notes (Signed)
Subjective: Patient states that she is feeling better today. Her left side weakness/numbess has improved and she only has tingling in her fingertips. Continues to state that her left eye is still itching. Denies SI or HI.  Objective: Vital signs in last 24 hours: Filed Vitals:   03/11/14 0509 03/11/14 0519 03/11/14 0553 03/11/14 0832  BP: 143/70     Pulse: 51 62    Temp: 98.1 F (36.7 C)     TempSrc: Oral     Resp: 20     Height: 5\' 10"  (1.778 m)     Weight: 87 kg (191 lb 12.8 oz)     SpO2: 98% 97% 95% 100%    Intake/Output Summary (Last 24 hours) at 03/11/14 1232 Last data filed at 03/11/14 1128  Gross per 24 hour  Intake 4015.5 ml  Output   4800 ml  Net -784.5 ml   PE:  Constitutional: Patient lying in bed in no acute distress. Less somnolent this morning. Head: Normocephalic and atraumatic Mouth: No erythema or exudates, MMM Eyes: PERRL, EOMI, improved swelling and less pain in OS, less photophobia in OS Cardiovascular: RRR, S1 normal, S2 normal, no murmurs, rubs, gallops. Pulmonary/Chest: normal respiratory effort, CTAB, no wheezes. Abdominal: Soft, non-tender, non-distended, bowel sounds present, no masses Musculoskeletal: No joint deformities, erythema, or stiffness, ROM full and no nontender Neurological: Decreased strength in left UE and LE. Decreased sensation in left fingertips only. Improved from previous exam. Skin: Warm, dry, and intact. No rash  Lab Results: Basic Metabolic Panel:  Recent Labs Lab 03/09/14 0039  NA 139  K 3.4*  CL 108  CO2 25  GLUCOSE 133*  BUN 10  CREATININE 1.10  CALCIUM 8.3*   Liver Function Tests:  Recent Labs Lab 03/09/14 0835  AST 19  ALT 33  ALKPHOS 87  BILITOT 0.6  PROT 7.0  ALBUMIN 3.6   CBC:  Recent Labs Lab 03/09/14 0039  WBC 9.6  NEUTROABS 6.5  HGB 12.0  HCT 36.7  MCV 91.1  PLT 286   Cardiac Enzymes:  Recent Labs Lab 03/09/14 0835  TROPONINI <0.03   Urine Drug Screen: Drugs of Abuse    Component Value Date/Time   LABOPIA NONE DETECTED 03/09/2014 0456   COCAINSCRNUR POSITIVE* 03/09/2014 0456   LABBENZ NONE DETECTED 03/09/2014 0456   AMPHETMU NONE DETECTED 03/09/2014 0456   THCU NONE DETECTED 03/09/2014 0456   LABBARB NONE DETECTED 03/09/2014 0456    Urinalysis:  Recent Labs Lab 03/09/14 0456  COLORURINE YELLOW  LABSPEC 1.015  PHURINE 5.5  GLUCOSEU NEGATIVE  HGBUR NEGATIVE  BILIRUBINUR NEGATIVE  KETONESUR NEGATIVE  PROTEINUR NEGATIVE  UROBILINOGEN 0.2  NITRITE NEGATIVE  LEUKOCYTESUR NEGATIVE   Studies/Results: Mr Shirlee LatchMra Head Wo Contrast  03/09/2014   CLINICAL DATA:  Personal history of hypertension and chronic substance abuse. Acute allergic reaction. Somnolence. Lethargy. Mental status changes.  EXAM: MRI HEAD WITHOUT CONTRAST  MRA HEAD WITHOUT CONTRAST  TECHNIQUE: Multiplanar, multiecho pulse sequences of the brain and surrounding structures were obtained without intravenous contrast. Angiographic images of the head were obtained using MRA technique without contrast.  COMPARISON:  Head CT same day  FINDINGS: MRI HEAD FINDINGS  There is extensive artifact related to hair braids, making the parenchymal imaging nearly useless. There does not appear to be any brain mass, old infarction, identifiable hemorrhage, hydrocephalus or extra-axial collection. No pituitary mass. Sinuses, middle ears and mastoids are clear. No useful diffusion imaging.  MRA HEAD FINDINGS  Both internal carotid arteries are patent.  There is considerable artifact. On the right, we can't determine that the middle cerebral artery and anterior cerebral artery are patent. On the left, there is more artifact. I think those vessels are patent on the left as well.  Both vertebral arteries are patent to the basilar. There is basilar flow. No branch vessel detail available.  IMPRESSION: Very degraded study due to artifact from unremovable hair braids. No brain parenchymal abnormality seen, with this limited  information. I think major vessels show flow, but detail is very limited.   Electronically Signed   By: Paulina Fusi M.D.   On: 03/09/2014 15:11   Mr Brain Wo Contrast  03/09/2014   CLINICAL DATA:  Personal history of hypertension and chronic substance abuse. Acute allergic reaction. Somnolence. Lethargy. Mental status changes.  EXAM: MRI HEAD WITHOUT CONTRAST  MRA HEAD WITHOUT CONTRAST  TECHNIQUE: Multiplanar, multiecho pulse sequences of the brain and surrounding structures were obtained without intravenous contrast. Angiographic images of the head were obtained using MRA technique without contrast.  COMPARISON:  Head CT same day  FINDINGS: MRI HEAD FINDINGS  There is extensive artifact related to hair braids, making the parenchymal imaging nearly useless. There does not appear to be any brain mass, old infarction, identifiable hemorrhage, hydrocephalus or extra-axial collection. No pituitary mass. Sinuses, middle ears and mastoids are clear. No useful diffusion imaging.  MRA HEAD FINDINGS  Both internal carotid arteries are patent. There is considerable artifact. On the right, we can't determine that the middle cerebral artery and anterior cerebral artery are patent. On the left, there is more artifact. I think those vessels are patent on the left as well.  Both vertebral arteries are patent to the basilar. There is basilar flow. No branch vessel detail available.  IMPRESSION: Very degraded study due to artifact from unremovable hair braids. No brain parenchymal abnormality seen, with this limited information. I think major vessels show flow, but detail is very limited.   Electronically Signed   By: Paulina Fusi M.D.   On: 03/09/2014 15:11   Medications:  Scheduled Meds: . aspirin  325 mg Oral Daily  . enoxaparin (LOVENOX) injection  40 mg Subcutaneous Q24H  . ipratropium-albuterol  3 mL Nebulization Q4H  . pantoprazole  20 mg Oral Daily  . polyvinyl alcohol  1 drop Left Eye QID  . sodium chloride  3  mL Intravenous Q12H   Continuous Infusions:  PRN Meds:.acetaminophen, albuterol, ondansetron **OR** ondansetron (ZOFRAN) IV  Assessment/Plan: Principal Problem:   Psychoactive substance-induced mood disorder Active Problems:   Illicit drug use   Altered mental status   Somnolence   Cocaine abuse   Acute allergic reaction   Decreased visual acuity   Left sided numbness   Nicole Mahoney is a 34 y.o. postpartum (2 weeks) female with PMH significant for previous admission, HTN, chronic substance abuse (cocaine and marijuana) who presented to the ED for an allergic rxn, now being evaluated for excessive somnolence, lethargic, left eye pain, photophobia, and generalized left sided weakness/numbess.  **Hx of suicide attempt - Pt does not currently endorse SI or HI and does not have a plan. She tried to take percocet and "some pills" previously and ended up in the hospital. She is currently two weeks post-partum and could be experiencing depression. Psychiatry consulted and they belive she may have psychoactive substance induced mood disorder. - Per psych, admit to psychiatry inpatient service when pt is stable - PT eval to see if pt is able to ambulate independently prior  to transfer  **Somnolent/AMS - Pt was more alert and interactive today. Improving. - Monitor vitals, BMP  **L sided weakness/numbess - Head CT, MRI/MRA were negative. Given pt's neuro exam findings, the presentation does not fit specific vascular etiology, so CVA is less likely. Pt's sx are improving. - Neuro checks daily - Repeat imaging if necessary  **L eye swelling - Possibly an allergic reaction to insect bite. Swelling has improved with Benadryl. However, given the photophobia, decreased acuity, and pain with left eye movements will need to rule out periorbital edema vs cellulitis. Ophtho consult via phone suggested that given that EOMI without conjunctival injection, it is probably corneal abrasion, uveitis, or  iritis which can be followed as outpatient if needed. - Monitor eye pain and sx daily - Artificial tear drops QID  - Further benadryl may make pt more somnolent, will hold for now - Ophtho f/u as outpatient if sx persists  **Drug abuse - Chronic cocaine use and hx of marijuana use. - Monitor vitals - Watch for withdrawal sx: paranoia, depression, anxiety, formication - Counsel on cessation/social work  Diet: Regular  Prophylaxis: SCDs Code: Full  Dispo: Disposition is deferred at this time, awaiting improvement of current medical problems. Anticipated discharge in approximately 2-3 day(s).    The patient does not have a current PCP (No Pcp Per Patient) and does need an [facility] hospital follow-up appointment after discharge.  The patient does have transportation limitations that hinder transportation to clinic appointments.   Services Needed at time of discharge: Y = Yes, Blank = No PT:  Y  OT:   RN:   Equipment:   Other:      LOS: 3 days   This is a Psychologist, occupational Note. The care of the patient was discussed with Dr. Delrae Sawyers and the assessment and plan was formulated with their assistance. Please see their note for official documentation of the patient encounter.  Signed: Mosie Epstein, Med Student Internal Medicine Teaching Service Team B2  [phone] 03/11/2014, 11:58 AM

## 2014-03-11 NOTE — Clinical Social Work Psych Note (Signed)
Psych CSW received referral for inpatient psychiatric hospitalization recommendation/disposition.  Psych CSW referred to the following facilities: - [facility]  Psych CSW will broaden search and continue to assist with placement.  Vickii PennaGina Theresa Dohrman, LCSWA [phone]  Psychiatric & Orthopedics (5N 1-16) Clinical Social Worker

## 2014-03-11 NOTE — Progress Notes (Signed)
Pt c/o of shortness of breath with chest pain and dizziness after using bedside commode, check O2 Sats 97% on room air, no c/o of chest pain while resting. Page MD, Made MD aware of pt hr in the 50's and c/o shortness of breath with dizziness. No new orders giving, will continue to monitor.

## 2014-03-11 NOTE — Clinical Social Work Psychosocial (Signed)
Clinical Social Work Department BRIEF PSYCHOSOCIAL ASSESSMENT 03/11/2014  Patient:  Nicole Mahoney, Nicole Mahoney     Account Number:  [id number]     Admit date:  03/08/2014  Clinical Social Worker:  Lovey Newcomer  Date/Time:  03/11/2014 12:17 PM  Referred by:  Physician  Date Referred:  03/11/2014 Referred for  Homelessness   Other Referral:   NA   Interview type:  Patient Other interview type:   Patient alert and oriented at time of assessment.    PSYCHOSOCIAL DATA Living Status:  ALONE Admitted from facility:   Level of care:   Primary support name:  Nicole Mahoney Primary support relationship to patient:  FAMILY Degree of support available:   Per patient, support is very limited.    CURRENT CONCERNS Current Concerns  Other - See comment   Other Concerns:   CSW consulted for homelessness, but patient is also requiring inpatient psychiatric admission, and her admission UDS was positive for cocaine. Patient also recently had a child on 1/20 at [facility] hospital.    [place] [facility] CSW met with patient at bedside to complete assessment. Patient lying in bed with her back towards CSW and head under sheets. CSW approached patient from other side to make eye contact with patient. Patient appeared disheveled, unkempt, and was guarded in her interaction with CSW. CSW attempted to obtain information from patient but patient would mostly reply to CSW's questions with head shakes or "yes" or "no". Patient did state she was living in a hotel prior to admission.    CSW inquired about the patient's child. The patient confirmed that she had the child on 02/23/14, but would not share with CSW the specific location of the child. Initially the patient reported that the child was with "family", then she later stated that the child was with a "friend." Upon further investigation and collaboration with [facility] CSW, CSW has learned that the child was held at hospital until foster  care placement was obtained. CSW has provided update for [facility] CSW who will contact CPS worker to provide information on patient current situation.    Overall patient understands the recommendation for inpatient psychiatric admission and is agreeable to the treatment. Psych CSW is aware of patient and will assist with discharge plan as appropriate.   Assessment/plan status:  No Further Intervention Required Other assessment/ plan:   NONE   Information/referral to community resources:   CSW has provided patient with list of shelters in [place].    PATIENT'S/FAMILY'S RESPONSE TO PLAN OF CARE: Patient states that she is agreeable to go to inpatient psychiatric placement if needed. She plans to utilize the shelter resources provided if needed. Overall patient very limited in her engagement and had little to no eye contact with CSW.       Liz Beach MSW, [place], [place], [phone]

## 2014-03-11 NOTE — Discharge Summary (Signed)
Name: Nicole Mahoney MRN: [id number] DOB: [birth year] 34 y.o. PCP: No Pcp Per Patient  Date of Admission: 03/08/2014 11:59 PM Date of Discharge: 03/11/2014 Attending Physician: Thayer Headings, MD  Discharge Diagnosis:   Psychoactive substance-induced mood disorder   Illicit drug use   Altered mental status   Cocaine abuse   Acute allergic reaction   Decreased visual acuity   Left sided numbness  Discharge Medications:   Medication List    TAKE these medications        diphenhydrAMINE 25 MG tablet  Commonly known as:  BENADRYL  Take 1 tablet (25 mg total) by mouth every 6 (six) hours.     EPINEPHrine 0.3 mg/0.3 mL Soaj injection  Commonly known as:  EPI-PEN  Inject 0.3 mLs (0.3 mg total) into the muscle once as needed (for allergic reaction).     famotidine 20 MG tablet  Commonly known as:  PEPCID  Take 1 tablet (20 mg total) by mouth 2 (two) times daily.     ibuprofen 600 MG tablet  Commonly known as:  ADVIL,MOTRIN  Take 1 tablet (600 mg total) by mouth every 6 (six) hours.        Disposition and follow-up:   Nicole Mahoney was discharged from [facility] in Stable condition.  At the hospital follow up visit please address:  1.  Anaphylactic Reaction: Treated in ED with resolution of symptoms. Unclear inciting factor.  Psychoactive Substance-Induced Mood Disorder: Patient to be treated in inpatient psychiatry.  Left-Sided Numbness and Weakness: Does not correlate with a neurologic distribution; appears to be volitional or psychiatric in origin. Improved during medicine admission.  Discharged with walker and will need continued outpatient PT.  2.  Labs / imaging needed at time of follow-up: none  3.  Pending labs/ test needing follow-up: none  Follow-up Appointments: Follow-up Information    Follow up with [facility].   Specialty:  Allergy and Asthma    Contact information:   [place] [phone]      Follow up with [facility]    .   Why:  Please schedule appointment once discharged from inpatient psychiatry   Contact information:   [address] [place] [address] [phone]      Follow up with Katy Apo, MD.   Specialty:  Ophthalmology   Why:  If left eye symptoms worsen   Contact information:   [place] [address] [phone]       Discharge Instructions:  Continue to use artificial tears eye drops as needed for your eye itching. If your eye is still bothering you after discharge from inpatient psychiatry, please schedule an appointment with Dr. Prudencio Burly (ophthalmology) who is aware of your symptoms.   Epinephrine Injection Epinephrine is a medicine given by injection to temporarily treat an emergency allergic reaction. It is also used to treat severe asthmatic attacks and other lung problems. The medicine helps to enlarge (dilate) the small breathing tubes of the lungs. A life-threatening, sudden allergic reaction that involves the whole body is called anaphylaxis. Because of potential side effects, epinephrine should only be used as directed by your caregiver. RISKS AND COMPLICATIONS Possible side effects of epinephrine injections include:  Chest pain.  Irregular or rapid heartbeat.  Shortness of breath.  Nausea.  Vomiting.  Abdominal pain or cramping.  Sweating.  Dizziness.  Weakness.  Headache.  Nervousness. Report all side effects to your caregiver. HOW TO GIVE AN EPINEPHRINE INJECTION Give the epinephrine injection immediately when symptoms of a severe reaction begin. Inject the medicine into the outer thigh or any available, large muscle. Your caregiver can teach you how to do this. You do not need to remove any clothing. After the injection, call your local emergency services (911 in [place] Even if you improve after the injection, you  need to be examined at a hospital emergency department. Epinephrine works quickly, but it also wears off quickly. Delayed reactions can occur. A delayed reaction may be as serious and dangerous as the initial reaction. HOME CARE INSTRUCTIONS  Make sure you and your family know how to give an epinephrine injection.  Use epinephrine injections as directed by your caregiver. Do not use this medicine more often or in larger doses than prescribed.  Always carry your epinephrine injection or anaphylaxis kit with you. This can be lifesaving if you have a severe reaction.  Store the medicine in a cool, dry place. If the medicine becomes discolored or cloudy, dispose of it properly and replace it with new medicine.  Check the expiration date on your medicine. It may be unsafe to use medicines past their expiration date.  Tell your caregiver about any other medicines you are taking. Some medicines can react badly with epinephrine.  Tell your caregiver about any medical conditions you have, such as diabetes, high blood pressure (hypertension), heart disease, irregular heartbeats, or if you are pregnant. SEEK IMMEDIATE MEDICAL CARE IF:  You have used an epinephrine injection. Call your local emergency services (911 in [place] Even if you improve after the injection, you need to be examined at a hospital emergency department to make sure your allergic reaction is under control. You will also be monitored for adverse effects from the medicine.  You have chest pain.  You have irregular or fast heartbeats.  You have shortness of breath.  You have severe headaches.  You have severe nausea, vomiting, or abdominal cramps.  You have severe pain, swelling, or redness in the area where you gave the injection. Document Released: 01/19/2000 Document Revised: 04/15/2011 Document Reviewed: 10/10/2010 [facility] Patient Information 2015 [place], [place]. This information is not intended to replace advice given to  you by your health care provider. Make sure you discuss any questions you have with your health care provider.  Allergies Allergies may happen from anything your body is sensitive to. This may be food, medicines, pollens, chemicals, and nearly anything around you in everyday life that produces allergens. An allergen is anything that causes an allergy producing substance. Heredity is often a factor in causing these problems. This means you may have some of the same allergies as your parents. Food allergies happen in all age groups. Food allergies are some of the most severe and life threatening. Some common food allergies are cow's milk, seafood, eggs, nuts, wheat, and soybeans. SYMPTOMS   Swelling around the mouth.  An itchy red rash or hives.  Vomiting or diarrhea.  Difficulty breathing. SEVERE ALLERGIC REACTIONS ARE LIFE-THREATENING. This reaction is called anaphylaxis. It can cause the mouth and throat to swell and cause difficulty with breathing and swallowing. In severe reactions only a trace amount of food (for example, peanut oil in a salad) may cause death within seconds. Seasonal allergies occur in all age groups. These are seasonal because they usually occur during the same season every year. They may be a reaction to molds, grass pollens, or  tree pollens. Other causes of problems are house dust mite allergens, pet dander, and mold spores. The symptoms often consist of nasal congestion, a runny itchy nose associated with sneezing, and tearing itchy eyes. There is often an associated itching of the mouth and ears. The problems happen when you come in contact with pollens and other allergens. Allergens are the particles in the air that the body reacts to with an allergic reaction. This causes you to release allergic antibodies. Through a chain of events, these eventually cause you to release histamine into the blood stream. Although it is meant to be protective to the body, it is this release  that causes your discomfort. This is why you were given anti-histamines to feel better. If you are unable to pinpoint the offending allergen, it may be determined by skin or blood testing. Allergies cannot be cured but can be controlled with medicine. Hay fever is a collection of all or some of the seasonal allergy problems. It may often be treated with simple over-the-counter medicine such as diphenhydramine. Take medicine as directed. Do not drink alcohol or drive while taking this medicine. Check with your caregiver or package insert for child dosages. If these medicines are not effective, there are many new medicines your caregiver can prescribe. Stronger medicine such as nasal spray, eye drops, and corticosteroids may be used if the first things you try do not work well. Other treatments such as immunotherapy or desensitizing injections can be used if all else fails. Follow up with your caregiver if problems continue. These seasonal allergies are usually not life threatening. They are generally more of a nuisance that can often be handled using medicine. HOME CARE INSTRUCTIONS   If unsure what causes a reaction, keep a diary of foods eaten and symptoms that follow. Avoid foods that cause reactions.  If hives or rash are present:  Take medicine as directed.  You may use an over-the-counter antihistamine (diphenhydramine) for hives and itching as needed.  Apply cold compresses (cloths) to the skin or take baths in cool water. Avoid hot baths or showers. Heat will make a rash and itching worse.  If you are severely allergic:  Following a treatment for a severe reaction, hospitalization is often required for closer follow-up.  Wear a medic-alert bracelet or necklace stating the allergy.  You and your family must learn how to give adrenaline or use an anaphylaxis kit.  If you have had a severe reaction, always carry your anaphylaxis kit or EpiPen with you. Use this medicine as directed by  your caregiver if a severe reaction is occurring. Failure to do so could have a fatal outcome. SEEK MEDICAL CARE IF:  You suspect a food allergy. Symptoms generally happen within 30 minutes of eating a food.  Your symptoms have not gone away within 2 days or are getting worse.  You develop new symptoms.  You want to retest yourself or your child with a food or drink you think causes an allergic reaction. Never do this if an anaphylactic reaction to that food or drink has happened before. Only do this under the care of a caregiver. SEEK IMMEDIATE MEDICAL CARE IF:   You have difficulty breathing, are wheezing, or have a tight feeling in your chest or throat.  You have a swollen mouth, or you have hives, swelling, or itching all over your body.  You have had a severe reaction that has responded to your anaphylaxis kit or an EpiPen. These reactions may return when the  medicine has worn off. These reactions should be considered life threatening. MAKE SURE YOU:   Understand these instructions.  Will watch your condition.  Will get help right away if you are not doing well or get worse. Document Released: 04/16/2002 Document Revised: 05/18/2012 Document Reviewed: 09/21/2007 [facility] Patient Information 2015 [place], [place]. This information is not intended to replace advice given to you by your health care provider. Make sure you discuss any questions you have with your health care provider.   Consultations:  Psychiatry  Procedures Performed:  Ct Head Wo Contrast  03/09/2014   CLINICAL DATA:  Altered mental status and decreased level of consciousness.  EXAM: CT HEAD WITHOUT CONTRAST  TECHNIQUE: Contiguous axial images were obtained from the base of the skull through the vertex without intravenous contrast.  COMPARISON:  10/12/2013  FINDINGS: The brain demonstrates no evidence of hemorrhage, infarction, edema, mass effect, extra-axial fluid collection, hydrocephalus or mass lesion. The skull  is unremarkable.  IMPRESSION: Normal head CT.   Electronically Signed   By: Aletta Edouard M.D.   On: 03/09/2014 08:53   Mr Jodene Nam Head Wo Contrast  03/09/2014   CLINICAL DATA:  Personal history of hypertension and chronic substance abuse. Acute allergic reaction. Somnolence. Lethargy. Mental status changes.  EXAM: MRI HEAD WITHOUT CONTRAST  MRA HEAD WITHOUT CONTRAST  TECHNIQUE: Multiplanar, multiecho pulse sequences of the brain and surrounding structures were obtained without intravenous contrast. Angiographic images of the head were obtained using MRA technique without contrast.  COMPARISON:  Head CT same day  FINDINGS: MRI HEAD FINDINGS  There is extensive artifact related to hair braids, making the parenchymal imaging nearly useless. There does not appear to be any brain mass, old infarction, identifiable hemorrhage, hydrocephalus or extra-axial collection. No pituitary mass. Sinuses, middle ears and mastoids are clear. No useful diffusion imaging.  MRA HEAD FINDINGS  Both internal carotid arteries are patent. There is considerable artifact. On the right, we can't determine that the middle cerebral artery and anterior cerebral artery are patent. On the left, there is more artifact. I think those vessels are patent on the left as well.  Both vertebral arteries are patent to the basilar. There is basilar flow. No branch vessel detail available.  IMPRESSION: Very degraded study due to artifact from unremovable hair braids. No brain parenchymal abnormality seen, with this limited information. I think major vessels show flow, but detail is very limited.   Electronically Signed   By: Nelson Chimes M.D.   On: 03/09/2014 15:11   Mr Brain Wo Contrast  03/09/2014   CLINICAL DATA:  Personal history of hypertension and chronic substance abuse. Acute allergic reaction. Somnolence. Lethargy. Mental status changes.  EXAM: MRI HEAD WITHOUT CONTRAST  MRA HEAD WITHOUT CONTRAST  TECHNIQUE: Multiplanar, multiecho pulse sequences  of the brain and surrounding structures were obtained without intravenous contrast. Angiographic images of the head were obtained using MRA technique without contrast.  COMPARISON:  Head CT same day  FINDINGS: MRI HEAD FINDINGS  There is extensive artifact related to hair braids, making the parenchymal imaging nearly useless. There does not appear to be any brain mass, old infarction, identifiable hemorrhage, hydrocephalus or extra-axial collection. No pituitary mass. Sinuses, middle ears and mastoids are clear. No useful diffusion imaging.  MRA HEAD FINDINGS  Both internal carotid arteries are patent. There is considerable artifact. On the right, we can't determine that the middle cerebral artery and anterior cerebral artery are patent. On the left, there is more artifact. I think those  vessels are patent on the left as well.  Both vertebral arteries are patent to the basilar. There is basilar flow. No branch vessel detail available.  IMPRESSION: Very degraded study due to artifact from unremovable hair braids. No brain parenchymal abnormality seen, with this limited information. I think major vessels show flow, but detail is very limited.   Electronically Signed   By: Nelson Chimes M.D.   On: 03/09/2014 15:11   Dg Chest Portable 1 View  03/09/2014   CLINICAL DATA:  Allergic reaction, shortness of breath, chest pain this morning  EXAM: PORTABLE CHEST - 1 VIEW  COMPARISON:  01/28/2011  FINDINGS: Low lung volumes. Patchy atelectasis or infiltrate at the lung bases, right greater than left. Heart size upper limits normal for technique. No pneumothorax. No effusion. Visualized skeletal structures are unremarkable.  IMPRESSION: 1. Low volumes. Patchy bibasilar atelectasis versus early infiltrates.   Electronically Signed   By: Arne Cleveland M.D.   On: 03/09/2014 08:36    Admission HPI: Ms. Wamble is a 34 yo woman with a history of intranasal cocaine use and NDVD on 02/23/14 who presented to the [facility] ED with  somnolence, facial edema and wheezing. She thought that she had been stung by an insect in her eye. Her swelling and wheezing greatly resolved with epinephrine, zantac, benadryl and solumedrol. However, the patient's somnolence persisted and she started to complain of full-body left-sided numbness/tingling. She also developed eye pain and blurred vision in the left eye. She admits to intranasal cocaine use yesterday, significant recent depression and nausea, but denies headache, chest pain, weakness or pain in her extremities. Since her spontaneous vaginal delivery, she has had heavy vaginal bleeding with clots.  Of note, the patient's baby was not discharged with her as a result of her persistent social issues and illicit drug use.   Hospital Course by problem list: Ms. Leisinger is a 34 yo recently post-partum woman with a history of suicide attempt who uses intranasal cocaine who was treated in the ED for an allergic reaction that involved facial swelling and wheezes, but was admitted due to her persistent somnolence, left-sided numbness/ tingling / weakness and left eye pain, decreased acuity, injection and photophobia. Her symptoms resolved, but she continued to appear depressed.  Depression and Prior Suicide Attempt: Patient is post-partum (2 weeks) and has multiple social stressors. Her baby was taken from her at birth. She appeared depressed and admits to depressed mood. Denies current SI or HI. Psychiatry recommended inpatient psychiatry.  Symptoms improved, strength intact and she was able to ambulate with the assistance of a walker.  Given her improvement and due to the fact her symptoms are likely psychiatric in nature she was medically stable for transfer to inpatient behavioral health where she can undergo further evaluation and therapy.  She would likely benefit from continued PT during and/or after her stay at behavioral health.  The patient has been provided with the contact information for  [facility] in order to establish care with a PCP after discharge from [facility].    Resolved AMS: Patient became much more alert; likely drug or mood-induced initially.  Left-Sided Full Body Numbness/Tingling/Weakness: Patient's tingling has persisted on the left side of her body and in V1 on the left; however, CT had no acute findings and MRI was inconclusive (artifact) and her complaints do not fit a neurologic region. Appears to be psychiatric or volitional on exam.  Improving Periorbital Edema, Pain on Movement, Decreased Acuity, Conjunctival Injection  and Photophobia OS: Patient believes she was bitten by an insect in her left eye (though it was never seen). Left eye symptoms are common findings, but could be signs of corneal abrasion, iritis, uveitis, preseptal celluitis, or orbital cellulitis. Patients who use intranasal cocaine are certainly at greater risk for cellulitis. Ophthalmology was made aware of patient yesterday and checked in for follow-up exam. Would be happy to evaluate her as outpatient (any day that she is d/c prior to 3:30PM). Concerning signs would be increased erythema, beefy redness to the conjunctiva, more pain or difficulty with movement of the eye. Symptoms are improving with artificial tears and warm compresses.  She was provided with the ophthalmologist contact information to arrange follow-up if needed.   Acute Allergic Reaction: Resolved with treatment in the ED. Some persistent left periorbital edema on admission, which may or may not be related. Considered cocaine contaminants, but patient's symptoms resolved.  She was provided with the allergist's contact information to arrange follow-up.  Polysubstance Abuse: Uses cocaine (intranasally) and marijuana. Cocaine + on UDS.  She will need counseling regarding drug abuse while at [facility].  Discharge Vitals:   BP 146/90 mmHg  Pulse 62  Temp(Src) 98.6 F (37 C) (Oral)  Resp 12  Ht '5\' 10"'  (1.778 m)  Wt 191 lb  12.8 oz (87 kg)  BMI 27.52 kg/m2  SpO2 100%  Breastfeeding? Yes  Signed: Francesca Oman, DO 03/12/2014, 3:33 PM    Services Ordered on Discharge: transferred to inpatient psychiatry Equipment Ordered on Discharge: rolling walker with 5 inch wheels

## 2014-03-11 NOTE — Evaluation (Signed)
Physical Therapy Evaluation Patient Details Name: Nicole Mahoney MRN: [id number] DOB: Aug 30, 1980 Today's Date: 03/11/2014   History of Present Illness  34 yo female with onset of L side weakness and L eye swelling and asthma, now with bibasilar atelectasis and positive for cocaine.  PMHx:  recent post-partum with infant removed, bronchitis, asthma  Clinical Impression  Pt was assessed to see if she can walk without assistance and will be able to transition to another unit.  PLOF was independence with no AD and stairs to enter her house.  Her plan is to be seen in AM to see what level her gait is to go to another unit for treatment of mood disorder.  RW may be needed if this is not successful.    Follow Up Recommendations Other (comment) (Going to behavioral health)    Equipment Recommendations  Rolling walker with 5" wheels    Recommendations for Other Services       Precautions / Restrictions Precautions Precautions: Fall (telemetry) Restrictions Weight Bearing Restrictions: No      Mobility  Bed Mobility Overal bed mobility: Needs Assistance Bed Mobility: Sidelying to Sit   Sidelying to sit: Min guard       General bed mobility comments: HOB slightly elevated and used bedrail with real effort  Transfers Overall transfer level: Needs assistance Equipment used: Rolling walker (2 wheeled);1 person hand held assist Transfers: Sit to/from [employer] Sit to Stand: Max assist Stand pivot transfers: Mod assist       General transfer comment: Pt has intermittent trembling on LLE with no outright knee buckling but looks unsteady  Ambulation/Gait Ambulation/Gait assistance: Mod assist Ambulation Distance (Feet): 30 Feet Assistive device: Rolling walker (2 wheeled) Gait Pattern/deviations: Step-to pattern;Decreased step length - right;Decreased step length - left;Decreased stride length;Decreased stance time - left;Decreased weight shift to  left;Ataxic;Wide base of support Gait velocity: reduced Gait velocity interpretation: Below normal speed for age/gender General Gait Details: Pt leans over her walker with PT providing mod assist support and takes very slow and shaky steps on LLE, no outright buckling observed  Stairs            Wheelchair Mobility    Modified Rankin (Stroke Patients Only)       Balance Overall balance assessment: Needs assistance Sitting-balance support: Feet supported Sitting balance-Leahy Scale: Good   Postural control: Posterior lean Standing balance support: Bilateral upper extremity supported Standing balance-Leahy Scale: Poor Standing balance comment: walker is essential to control LLE                             Pertinent Vitals/Pain Pain Assessment: No/denies pain    Home Living Family/patient expects to be discharged to:: Other (Comment) (motel) Living Arrangements: Non-relatives/Friends                    Prior Function Level of Independence: Independent               Hand Dominance        Extremity/Trunk Assessment   Upper Extremity Assessment: Overall WFL for tasks assessed           Lower Extremity Assessment: LLE deficits/detail   LLE Deficits / Details: Strength changes to LLE with 2+ to 3- strength, unable to fully lift leg in sitting but did get up to bedside without help to LLE  Cervical / Trunk Assessment: Normal  Communication   Communication: No difficulties;Other (comment) (  Pt requires mult requests to get an answer to some questions)  Cognition Arousal/Alertness: Awake/alert Behavior During Therapy: Flat affect Overall Cognitive Status: No family/caregiver present to determine baseline cognitive functioning       Memory: Decreased recall of precautions              General Comments General comments (skin integrity, edema, etc.): Pt is unsafe to walk alone and will not be attempting to do this.  MD wants to send  to unit with independent gait requirement.  Will pull visit to early AM to see what she looks like then.    Exercises General Exercises - Lower Extremity Ankle Circles/Pumps: AAROM;Left;5 reps Short Arc Quad: AROM;AAROM;Both;10 reps Long Arc Quad: AAROM;Left;10 reps Heel Slides: AAROM;Left;5 reps Hip ABduction/ADduction: AAROM;Left;5 reps Hip Flexion/Marching: AAROM;Left;5 reps      Assessment/Plan    PT Assessment Patient needs continued PT services  PT Diagnosis Difficulty walking   PT Problem List Decreased strength;Decreased range of motion;Decreased activity tolerance;Decreased balance;Decreased mobility;Decreased coordination;Decreased knowledge of use of DME;Decreased safety awareness;Decreased skin integrity  PT Treatment Interventions DME instruction;Gait training;Stair training;Functional mobility training;Therapeutic activities;Therapeutic exercise;Balance training;Neuromuscular re-education;Patient/family education   PT Goals (Current goals can be found in the Care Plan section) Acute Rehab PT Goals Patient Stated Goal: Did not state PT Goal Formulation: With patient Time For Goal Achievement: 03/25/14 Potential to Achieve Goals: Good    Frequency Min 3X/week   Barriers to discharge  (Pt needs to be admitted for further care in a behavioral hea) Pt needs to be admitted for further care in a behavioral health unit    Co-evaluation               End of Session Equipment Utilized During Treatment:  (FWW) Activity Tolerance: Patient limited by lethargy Patient left: in chair;with call bell/phone within reach Nurse Communication: Mobility status    Functional Assessment Tool Used: clinical judgment Functional Limitation: Mobility: Walking and moving around Mobility: Walking and Moving Around Current Status ([id number]([id number]): At least 60 percent but less than 80 percent impaired, limited or restricted Mobility: Walking and Moving Around Goal Status [phone]([id number]): At least 1  percent but less than 20 percent impaired, limited or restricted    Time: 1215-1246 PT Time Calculation (min) (ACUTE ONLY): 31 min   Charges:   PT Evaluation $Initial PT Evaluation Tier I: 1 Procedure PT Treatments $Gait Training: 8-22 mins   PT G Codes:   PT G-Codes **NOT FOR INPATIENT CLASS** Functional Assessment Tool Used: clinical judgment Functional Limitation: Mobility: Walking and moving around Mobility: Walking and Moving Around Current Status ([id number](G8978): At least 60 percent but less than 80 percent impaired, limited or restricted Mobility: Walking and Moving Around Goal Status [phone](G8979): At least 1 percent but less than 20 percent impaired, limited or restricted    Ivar DrapeStout, Averie Meiner E 03/11/2014, 2:23 PM   Samul Dadauth Barnaby Rippeon, PT MS Acute Rehab Dept. Number: [phone]

## 2014-03-11 NOTE — Discharge Instructions (Signed)
Continue to use artificial tears eye drops as needed for your eye itching. If your eye is still bothering you after discharge from inpatient psychiatry, please schedule an appointment with Dr. Prudencio Burly (ophthalmology) who is aware of your symptoms.   Epinephrine Injection Epinephrine is a medicine given by injection to temporarily treat an emergency allergic reaction. It is also used to treat severe asthmatic attacks and other lung problems. The medicine helps to enlarge (dilate) the small breathing tubes of the lungs. A life-threatening, sudden allergic reaction that involves the whole body is called anaphylaxis. Because of potential side effects, epinephrine should only be used as directed by your caregiver. RISKS AND COMPLICATIONS Possible side effects of epinephrine injections include:  Chest pain.  Irregular or rapid heartbeat.  Shortness of breath.  Nausea.  Vomiting.  Abdominal pain or cramping.  Sweating.  Dizziness.  Weakness.  Headache.  Nervousness. Report all side effects to your caregiver. HOW TO GIVE AN EPINEPHRINE INJECTION Give the epinephrine injection immediately when symptoms of a severe reaction begin. Inject the medicine into the outer thigh or any available, large muscle. Your caregiver can teach you how to do this. You do not need to remove any clothing. After the injection, call your local emergency services (911 in [place] Even if you improve after the injection, you need to be examined at a hospital emergency department. Epinephrine works quickly, but it also wears off quickly. Delayed reactions can occur. A delayed reaction may be as serious and dangerous as the initial reaction. HOME CARE INSTRUCTIONS  Make sure you and your family know how to give an epinephrine injection.  Use epinephrine injections as directed by your caregiver. Do not use this medicine more often or in larger doses than prescribed.  Always carry your epinephrine injection or  anaphylaxis kit with you. This can be lifesaving if you have a severe reaction.  Store the medicine in a cool, dry place. If the medicine becomes discolored or cloudy, dispose of it properly and replace it with new medicine.  Check the expiration date on your medicine. It may be unsafe to use medicines past their expiration date.  Tell your caregiver about any other medicines you are taking. Some medicines can react badly with epinephrine.  Tell your caregiver about any medical conditions you have, such as diabetes, high blood pressure (hypertension), heart disease, irregular heartbeats, or if you are pregnant. SEEK IMMEDIATE MEDICAL CARE IF:  You have used an epinephrine injection. Call your local emergency services (911 in [place] Even if you improve after the injection, you need to be examined at a hospital emergency department to make sure your allergic reaction is under control. You will also be monitored for adverse effects from the medicine.  You have chest pain.  You have irregular or fast heartbeats.  You have shortness of breath.  You have severe headaches.  You have severe nausea, vomiting, or abdominal cramps.  You have severe pain, swelling, or redness in the area where you gave the injection. Document Released: 01/19/2000 Document Revised: 04/15/2011 Document Reviewed: 10/10/2010 [facility] Patient Information 2015 [place], [place]. This information is not intended to replace advice given to you by your health care provider. Make sure you discuss any questions you have with your health care provider.  Allergies Allergies may happen from anything your body is sensitive to. This may be food, medicines, pollens, chemicals, and nearly anything around you in everyday life that produces allergens. An allergen is anything that causes an allergy producing substance. Heredity  is often a factor in causing these problems. This means you may have some of the same allergies as your  parents. Food allergies happen in all age groups. Food allergies are some of the most severe and life threatening. Some common food allergies are cow's milk, seafood, eggs, nuts, wheat, and soybeans. SYMPTOMS   Swelling around the mouth.  An itchy red rash or hives.  Vomiting or diarrhea.  Difficulty breathing. SEVERE ALLERGIC REACTIONS ARE LIFE-THREATENING. This reaction is called anaphylaxis. It can cause the mouth and throat to swell and cause difficulty with breathing and swallowing. In severe reactions only a trace amount of food (for example, peanut oil in a salad) may cause death within seconds. Seasonal allergies occur in all age groups. These are seasonal because they usually occur during the same season every year. They may be a reaction to molds, grass pollens, or tree pollens. Other causes of problems are house dust mite allergens, pet dander, and mold spores. The symptoms often consist of nasal congestion, a runny itchy nose associated with sneezing, and tearing itchy eyes. There is often an associated itching of the mouth and ears. The problems happen when you come in contact with pollens and other allergens. Allergens are the particles in the air that the body reacts to with an allergic reaction. This causes you to release allergic antibodies. Through a chain of events, these eventually cause you to release histamine into the blood stream. Although it is meant to be protective to the body, it is this release that causes your discomfort. This is why you were given anti-histamines to feel better. If you are unable to pinpoint the offending allergen, it may be determined by skin or blood testing. Allergies cannot be cured but can be controlled with medicine. Hay fever is a collection of all or some of the seasonal allergy problems. It may often be treated with simple over-the-counter medicine such as diphenhydramine. Take medicine as directed. Do not drink alcohol or drive while taking  this medicine. Check with your caregiver or package insert for child dosages. If these medicines are not effective, there are many new medicines your caregiver can prescribe. Stronger medicine such as nasal spray, eye drops, and corticosteroids may be used if the first things you try do not work well. Other treatments such as immunotherapy or desensitizing injections can be used if all else fails. Follow up with your caregiver if problems continue. These seasonal allergies are usually not life threatening. They are generally more of a nuisance that can often be handled using medicine. HOME CARE INSTRUCTIONS   If unsure what causes a reaction, keep a diary of foods eaten and symptoms that follow. Avoid foods that cause reactions.  If hives or rash are present:  Take medicine as directed.  You may use an over-the-counter antihistamine (diphenhydramine) for hives and itching as needed.  Apply cold compresses (cloths) to the skin or take baths in cool water. Avoid hot baths or showers. Heat will make a rash and itching worse.  If you are severely allergic:  Following a treatment for a severe reaction, hospitalization is often required for closer follow-up.  Wear a medic-alert bracelet or necklace stating the allergy.  You and your family must learn how to give adrenaline or use an anaphylaxis kit.  If you have had a severe reaction, always carry your anaphylaxis kit or EpiPen with you. Use this medicine as directed by your caregiver if a severe reaction is occurring. Failure to do so could  have a fatal outcome. SEEK MEDICAL CARE IF:  You suspect a food allergy. Symptoms generally happen within 30 minutes of eating a food.  Your symptoms have not gone away within 2 days or are getting worse.  You develop new symptoms.  You want to retest yourself or your child with a food or drink you think causes an allergic reaction. Never do this if an anaphylactic reaction to that food or drink has  happened before. Only do this under the care of a caregiver. SEEK IMMEDIATE MEDICAL CARE IF:   You have difficulty breathing, are wheezing, or have a tight feeling in your chest or throat.  You have a swollen mouth, or you have hives, swelling, or itching all over your body.  You have had a severe reaction that has responded to your anaphylaxis kit or an EpiPen. These reactions may return when the medicine has worn off. These reactions should be considered life threatening. MAKE SURE YOU:   Understand these instructions.  Will watch your condition.  Will get help right away if you are not doing well or get worse. Document Released: 04/16/2002 Document Revised: 05/18/2012 Document Reviewed: 09/21/2007 [facility] Patient Information 2015 [place], [place]. This information is not intended to replace advice given to you by your health care provider. Make sure you discuss any questions you have with your health care provider.
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
## 2014-03-11 NOTE — Progress Notes (Signed)
UR completed 

## 2014-03-12 ENCOUNTER — Encounter (HOSPITAL_COMMUNITY): Payer: Self-pay | Admitting: *Deleted

## 2014-03-12 ENCOUNTER — Inpatient Hospital Stay (HOSPITAL_COMMUNITY)
Admission: AD | Admit: 2014-03-12 | Discharge: 2014-03-15 | DRG: 885 | Disposition: A | Discharge status: Home / Self Care | Payer: Medicaid Other | Source: Intra-hospital | Attending: Psychiatry | Admitting: Psychiatry

## 2014-03-12 DIAGNOSIS — Z833 Family history of diabetes mellitus: Secondary | ICD-10-CM | POA: Diagnosis not present

## 2014-03-12 DIAGNOSIS — R45851 Suicidal ideations: Secondary | ICD-10-CM | POA: Diagnosis present

## 2014-03-12 DIAGNOSIS — T63441A Toxic effect of venom of bees, accidental (unintentional), initial encounter: Secondary | ICD-10-CM | POA: Diagnosis not present

## 2014-03-12 DIAGNOSIS — F1420 Cocaine dependence, uncomplicated: Secondary | ICD-10-CM | POA: Diagnosis present

## 2014-03-12 DIAGNOSIS — F329 Major depressive disorder, single episode, unspecified: Secondary | ICD-10-CM | POA: Diagnosis present

## 2014-03-12 DIAGNOSIS — F322 Major depressive disorder, single episode, severe without psychotic features: Principal | ICD-10-CM | POA: Diagnosis present

## 2014-03-12 DIAGNOSIS — F17210 Nicotine dependence, cigarettes, uncomplicated: Secondary | ICD-10-CM | POA: Diagnosis present

## 2014-03-12 DIAGNOSIS — F419 Anxiety disorder, unspecified: Secondary | ICD-10-CM | POA: Diagnosis present

## 2014-03-12 DIAGNOSIS — Z825 Family history of asthma and other chronic lower respiratory diseases: Secondary | ICD-10-CM | POA: Diagnosis not present

## 2014-03-12 DIAGNOSIS — I10 Essential (primary) hypertension: Secondary | ICD-10-CM | POA: Diagnosis present

## 2014-03-12 DIAGNOSIS — J45909 Unspecified asthma, uncomplicated: Secondary | ICD-10-CM | POA: Diagnosis present

## 2014-03-12 DIAGNOSIS — K219 Gastro-esophageal reflux disease without esophagitis: Secondary | ICD-10-CM | POA: Diagnosis present

## 2014-03-12 DIAGNOSIS — F1994 Other psychoactive substance use, unspecified with psychoactive substance-induced mood disorder: Secondary | ICD-10-CM

## 2014-03-12 MED ORDER — ALUM & MAG HYDROXIDE-SIMETH 200-200-20 MG/5ML PO SUSP: 30.0000 mL | ORAL | Status: DC | PRN

## 2014-03-12 MED ORDER — DIPHENHYDRAMINE HCL 25 MG PO CAPS
25.0000 mg | ORAL_CAPSULE | Freq: Four times a day (QID) | ORAL | Status: DC | PRN
  Administered 2014-03-12 – 2014-03-14 (×5): 25 mg via ORAL
  Filled 2014-03-12 (×5): qty 1

## 2014-03-12 MED ORDER — EPINEPHRINE 0.3 MG/0.3ML IJ SOAJ: 0.3000 mg | Freq: Once | INTRAMUSCULAR | Status: DC | PRN

## 2014-03-12 MED ORDER — ACETAMINOPHEN 325 MG PO TABS: 650.0000 mg | ORAL_TABLET | Freq: Four times a day (QID) | ORAL | Status: DC | PRN

## 2014-03-12 MED ORDER — FAMOTIDINE 20 MG PO TABS
20.0000 mg | ORAL_TABLET | Freq: Two times a day (BID) | ORAL | Status: DC
  Administered 2014-03-13 – 2014-03-15 (×5): 20 mg via ORAL
  Filled 2014-03-12 (×10): qty 1

## 2014-03-12 MED ORDER — ALBUTEROL SULFATE HFA 108 (90 BASE) MCG/ACT IN AERS
2.0000 | INHALATION_SPRAY | Freq: Four times a day (QID) | RESPIRATORY_TRACT | Status: DC | PRN
  Administered 2014-03-14 – 2014-03-15 (×3): 2 via RESPIRATORY_TRACT
  Filled 2014-03-12: qty 6.7

## 2014-03-12 MED ORDER — MAGNESIUM HYDROXIDE 400 MG/5ML PO SUSP: 30.0000 mL | Freq: Every day | ORAL | Status: DC | PRN

## 2014-03-12 MED ORDER — IBUPROFEN 600 MG PO TABS
600.0000 mg | ORAL_TABLET | Freq: Four times a day (QID) | ORAL | Status: DC | PRN
Start: 2014-03-12 — End: 2014-03-15
  Administered 2014-03-13: 600 mg via ORAL
  Filled 2014-03-12: qty 1

## 2014-03-12 MED ORDER — IBUPROFEN 100 MG/5ML PO SUSP
600.0000 mg | Freq: Four times a day (QID) | ORAL | Status: DC
  Filled 2014-03-12 (×2): qty 30

## 2014-03-12 MED ORDER — EPINEPHRINE 0.3 MG/0.3ML IJ SOAJ: 0.3000 mg | Freq: Once | INTRAMUSCULAR | Status: AC | PRN
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
## 2014-03-12 NOTE — Progress Notes (Signed)
Subjective: NAEON.  [name] was seen and examined this morning.  She is feeling better and says she just worked with PT.  Ambulated with walker.  Her sensory deficits are improving.  Appetite is good and she has just completed breakfast.  Bladder and bowel are normal.   Objective: Vital signs in last 24 hours: Filed Vitals:   03/11/14 2155 03/12/14 0018 03/12/14 0327 03/12/14 0554  BP: 137/71   145/87  Pulse: 70   62  Temp: 98.5 F (36.9 C)   98.4 F (36.9 C)  TempSrc: Oral   Oral  Resp: 18   18  Height:      Weight:      SpO2: 97% 97% 98% 99%   Weight change:   Intake/Output Summary (Last 24 hours) at 03/12/14 0816 Last data filed at 03/12/14 0558  Gross per 24 hour  Intake    584 ml  Output   1400 ml  Net   -816 ml   Physical Exam: Appearance:  Asleep but easily arousable and communicative throughout exam HEENT: AT/[place], resolved periorbital edema OS, less pain on EOM OS and patient is able to make full movements, resolving conjunctival injection OS, improved exam from yesterday Heart: RRR, normal S1S2, no MRG Lungs: CTAB, mo M/R/G, no increased work of breathing Abdomen: BS+, soft, post-partum abdomen, minimal tenderness diffusely Neurologic: AAO x 3, sensation intact B/L face and upper extremities, decreased sensation on LLE, strength intact upper and lower extremities (improvement from admission) Skin: no rashes or lesions  Medications: I have reviewed the patient's current medications. Scheduled Meds: . aspirin  325 mg Oral Daily  . enoxaparin (LOVENOX) injection  40 mg Subcutaneous Q24H  . ipratropium-albuterol  3 mL Nebulization Q4H  . pantoprazole  20 mg Oral Daily  . polyvinyl alcohol  1 drop Left Eye QID  . sodium chloride  3 mL Intravenous Q12H   Continuous Infusions: none   PRN Meds:.acetaminophen, albuterol, ondansetron **OR** ondansetron (ZOFRAN) IV   Assessment/Plan: [name] is a 34 yo recently post-partum woman who uses intranasal cocaine who  was treated overnight in the ED for an allergic reaction that involved facial swelling and wheezes, but was admitted due to her persistent somnolence, left-sided numbness/ tingling / weakness and left eye pain, decreased acuity, injection and photophobia.  Depression and Prior Suicide Attempt: Patient is post-partum (2 weeks) and has multiple social stressors and admits to depression.  Appreciate psychiatry recommendations for inpatient psych once medically stable.  Appreciate PT and CSW assistance.   - if patient's ambulation has improved will plan for d/c as soon as [facility] bed availlable. - [facility]  Resolved AMS: Patient is AAO, appropriately communicative.   - continue to monitor  Left-Sided Full Body Numbness/Tingling/Weakness: Her symptoms are improving and she feels [facility].  Symptoms did not fit with neuro etiology and appeared to be psychiatric etiology.  This is further supported by depression symptoms and she is 2 weeks post-partum. - Continue PT, [facility]  Improving Periorbital Edema, Pain on Movement, Decreased Acuity, Conjunctival Injection and Photophobia OS: Signs and symptoms are improving.  No redness today. Good EOM.   - Artificial tears QID   - Warm compresses prn  Acute Allergic Reaction: Resolved - Continue to monitor  Polysubstance Abuse: Cocaine (intranasal) and marijuana. Cocaine + on UDS. - Offer cessation counseling  Diet: Regular diet  Dispo: Disposition is deferred at this  time, awaiting improvement of current medical problems.  Anticipated discharge in approximately 0-2 day(s) pending inpatient psychiatry bed availability.   The patient does not have a current PCP (No Pcp Per Patient) and does not need an [facility] hospital follow-up appointment after discharge.  The patient does have transportation limitations that hinder transportation to clinic appointments.  .Services Needed at  time of discharge: Y = Yes, Blank = No PT: Y  OT:   RN:   Equipment:   Other:     LOS: 4 days   Yolanda Manges, DO 03/12/2014, 8:16 AM

## 2014-03-12 NOTE — Progress Notes (Addendum)
Attempted to call report to admissions nurse at behavioral health [phone]) beginning at 1645 again at 1730 and again at 1800. Awaiting a call back. After they call for report call [facility] to transport patient over [phone].   !!!MUST SEND VOLUNTARY COMMITMENT PAPERS - ON FRONT OF CHART!!!  1830 Marylu LundJanet called from behavioral health to receive report.

## 2014-03-12 NOTE — Tx Team (Signed)
Initial Interdisciplinary Treatment Plan   PATIENT STRESSORS: Marital or family conflict Substance abuse   PATIENT STRENGTHS: Active sense of humor Average or above average intelligence Supportive family/friends   PROBLEM LIST: Problem List/Patient Goals Date to be addressed Date deferred Reason deferred Estimated date of resolution  Depression 03/12/14     Suicidal ideation (denies at present) 03/12/14     "to get off drugs" 03/12/14                                          DISCHARGE CRITERIA:  Improved stabilization in mood, thinking, and/or behavior Need for constant or close observation no longer present  PRELIMINARY DISCHARGE PLAN: Attend 12-step recovery group Participate in family therapy  PATIENT/FAMIILY INVOLVEMENT: This treatment plan has been presented to and reviewed with the patient, Gasper Sellsequilla M Bradway.  The patient and family have been given the opportunity to ask questions and make suggestions.  Juliann ParesBowman, Hersh Minney Elizabeth 03/12/2014, 11:36 PM

## 2014-03-12 NOTE — Progress Notes (Signed)
[facility] with belongings and walker. No distress noted

## 2014-03-12 NOTE — Progress Notes (Signed)
Admission note:  Pt is 34 year old AAF admitted to the services of Dr. Dub MikesLugo for treatment of cocaine and alcohol abuse.  Pt has recently given birth on January 20th and also has a 3810 month old child.  Pt states that she has recently gotten married also.  Pt denies suicidal ideation at this time but had a past attempt two weeks ago that she did not wish to discuss per ED records.  Pt is using a walker and states that she has a pinched nerve in her leg from an allergic reaction that she feels had received at [employer].  Pt states that her face had been swollen and she had received an epi shot as well as Benadryl.  She is still taking the Benadryl every 6 hours.  The patient was unclear if the pinched nerve happened when the allergic reaction happened or if it was due to the shot she received.  Pt is a high fall risk.  Pt lists Antonion, her best friend, as her emergency contact.  He may be reached at [phone]

## 2014-03-12 NOTE — Progress Notes (Signed)
Physical Therapy Treatment Patient Details Name: CRISTIE MCKINNEY MRN: [id number] DOB: 12-13-1980 Today's Date: 03/12/2014    History of Present Illness 34 yo female with onset of L side weakness and L eye swelling and asthma, now with bibasilar atelectasis and positive for cocaine.  PMHx:  recent post-partum with infant removed, bronchitis, asthma    PT Comments    Pt was seen for another visit with pt demonstrating abiltiy to use walker without extra help, able to use LLE without assistance but inconsistent.  Her plan for transition to behavioral health should be fine as she can walk on walker.  Nursing and MD were informed of same.  Follow Up Recommendations  Other (comment) (behavioral health)     Equipment Recommendations  Rolling walker with 5" wheels    Recommendations for Other Services       Precautions / Restrictions Precautions Precautions: Fall Restrictions Weight Bearing Restrictions: No    Mobility  Bed Mobility Overal bed mobility: Modified Independent Bed Mobility: Supine to Sit     Supine to sit: Modified independent (Device/Increase time)     General bed mobility comments: Pt tried to move LLE with hands but had just lifted her leg with no help  Transfers Overall transfer level: Needs assistance Equipment used: Rolling walker (2 wheeled) Transfers: Sit to/from Stand Sit to Stand: Min guard (for safety)         General transfer comment: Pt has a shaking at times with LLE but does not buckle  Ambulation/Gait Ambulation/Gait assistance: Min guard (for safety) Ambulation Distance (Feet): 20 Feet Assistive device: Rolling walker (2 wheeled) Gait Pattern/deviations: Step-through pattern;Decreased stance time - left;Ataxic;Narrow base of support Gait velocity: reduced Gait velocity interpretation: Below normal speed for age/gender General Gait Details: standing more upright and can control walker and her LLE without help   Stairs             Wheelchair Mobility    Modified Rankin (Stroke Patients Only)       Balance Overall balance assessment: Needs assistance Sitting-balance support: Feet supported Sitting balance-Leahy Scale: Good     Standing balance support: Bilateral upper extremity supported Standing balance-Leahy Scale: Fair Standing balance comment: controlling LLE with walker but not extra help                    Cognition Arousal/Alertness: Awake/alert Behavior During Therapy: Flat affect Overall Cognitive Status: No family/caregiver present to determine baseline cognitive functioning                      Exercises General Exercises - Lower Extremity Ankle Circles/Pumps: AROM;AAROM;Both;10 reps Quad Sets: AROM;AAROM;Both;10 reps Gluteal Sets: AROM;Both;10 reps Heel Slides: AROM;AAROM;Both;10 reps Hip ABduction/ADduction: AROM;AAROM;Both;10 reps    General Comments General comments (skin integrity, edema, etc.): Pt will wait to be asked to walk but can manage with her walker      Pertinent Vitals/Pain Pain Assessment: No/denies pain    Home Living                      Prior Function            PT Goals (current goals can now be found in the care plan section) Acute Rehab PT Goals Patient Stated Goal: Did not state PT Goal Formulation: With patient Progress towards PT goals: Progressing toward goals (Additional treatment from 822 to 832)    Frequency  Min 3X/week    PT Plan Current plan remains  appropriate    Co-evaluation             End of Session Equipment Utilized During Treatment: Other (comment) (FWW) Activity Tolerance: Patient tolerated treatment well Patient left: in chair;with call bell/phone within reach     Time: 0750-0804 PT Time Calculation (min) (ACUTE ONLY): 14 min  Charges:  $Gait Training: 8-22 mins $Therapeutic Exercise: 8-22 mins                    G Codes:      Ivar DrapeStout, Zyiere Rosemond E 03/12/2014, 1:53 PM   Samul Dadauth Jakeb Lamping, PT  MS Acute Rehab Dept. Number: [phone]

## 2014-03-12 NOTE — Progress Notes (Signed)
CSW [facility] with Julie[facility] Cottonina[facility] for room [facility]01 with Dr. Dub MikesLugo as accepting physician.  CSW [facility] and notified Attending.  Patient will be sent via [facility] when Attending completes d/c.  [facility] Arine Foley Macy MisLCSW,LCAS [place] ED CSW [phone]

## 2014-03-13 DIAGNOSIS — F322 Major depressive disorder, single episode, severe without psychotic features: Principal | ICD-10-CM

## 2014-03-13 DIAGNOSIS — R45851 Suicidal ideations: Secondary | ICD-10-CM

## 2014-03-13 DIAGNOSIS — F1994 Other psychoactive substance use, unspecified with psychoactive substance-induced mood disorder: Secondary | ICD-10-CM

## 2014-03-13 DIAGNOSIS — F1420 Cocaine dependence, uncomplicated: Secondary | ICD-10-CM

## 2014-03-13 MED ORDER — TRAZODONE HCL 50 MG PO TABS
50.0000 mg | ORAL_TABLET | Freq: Every evening | ORAL | Status: DC | PRN
  Administered 2014-03-13 – 2014-03-14 (×2): 50 mg via ORAL
  Filled 2014-03-13 (×2): qty 1
  Filled 2014-03-13: qty 3

## 2014-03-13 MED ORDER — BACITRACIN-POLYMYXIN B 500-10000 UNIT/GM OP OINT
TOPICAL_OINTMENT | Freq: Two times a day (BID) | OPHTHALMIC | Status: DC
Start: 2014-03-13 — End: 2014-03-15
  Administered 2014-03-13 – 2014-03-15 (×4): via OPHTHALMIC
  Filled 2014-03-13: qty 3.5

## 2014-03-13 MED ORDER — SERTRALINE HCL 25 MG PO TABS
25.0000 mg | ORAL_TABLET | Freq: Every day | ORAL | Status: DC
  Administered 2014-03-13 – 2014-03-14 (×2): 25 mg via ORAL
  Filled 2014-03-13 (×4): qty 1

## 2014-03-13 MED ORDER — POLYVINYL ALCOHOL 1.4 % OP SOLN
1.0000 [drp] | OPHTHALMIC | Status: DC | PRN
  Administered 2014-03-13 – 2014-03-15 (×5): 1 [drp] via OPHTHALMIC
  Filled 2014-03-13: qty 15

## 2014-03-13 MED ORDER — HYDROXYZINE HCL 25 MG PO TABS: 25.0000 mg | ORAL_TABLET | Freq: Three times a day (TID) | ORAL | Status: DC | PRN

## 2014-03-13 NOTE — Progress Notes (Signed)
Adult Psychoeducational Group Note  Date:  03/13/2014 Time:  4:21PM  Group Topic/Focus: Therapeutic activity    Participation Level:  Did Not Attend  Participation Quality:    Affect:   Cognitive:    Insight:   Engagement in Group:    Modes of Intervention:    Additional Comments: Pt did not attend group this afternoon.  Rashidah Belleville A 03/13/2014, 4:21 PM 
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
## 2014-03-13 NOTE — Progress Notes (Signed)
D.  Pt bright and pleasant on approach, no complaints voiced at this time.  Pt was apprehensive about receiving a roommate but did well when roommate arrived.  Interacting appropriately with peers on the unit.  Denies SI/HI/hallucinatons at this time.  Pt went to bed early this evening.  A.  Support and encouragement offered  R. Pt remains safe on unit, will continue to monitor.

## 2014-03-13 NOTE — BHH Group Notes (Signed)
[facility] Group Notes: (Clinical Social Work)   03/13/2014      Type of Therapy:  Group Therapy   Participation Level:  Did Not Attend despite MHT prompting   Ambrose MantleMareida Grossman-Orr, LCSW 03/13/2014, 12:59 PM

## 2014-03-14 MED ORDER — SERTRALINE HCL 50 MG PO TABS
50.0000 mg | ORAL_TABLET | Freq: Every day | ORAL | Status: DC
  Administered 2014-03-15: 50 mg via ORAL
  Filled 2014-03-14: qty 1
  Filled 2014-03-14: qty 3
  Filled 2014-03-14: qty 1

## 2014-03-14 NOTE — Progress Notes (Signed)
D: Patient has appropriate affect and depressed mood. She reported on the self inventory sheet that her sleep, appetite and ability to concentrate are all good and energy level is normal. Patient is rating depression "7", feelings of hopelessness "4" and anxiety "3". She's attending group sessions throughout the day, interactive with peers in the dayroom and visible in the milieu. In adherence with all medications.  A: Support and encouragement provided to patient. Administered medications per MD orders. Monitor Q15 minute checks for safety.  R: Patient receptive. Denies SI/HI and AVH. Patient remains safe on the unit.
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
## 2014-03-14 NOTE — Progress Notes (Signed)
Pt attended spiritual care group on grief and loss facilitated by chaplain Burnis KingfisherMatthew Fraya Ueda. Group opened with brief discussion and psycho-social ed around grief and loss in relationships and in relation to self - identifying life patterns, circumstances, changes that cause losses. Established group norm of speaking from own life experience. Group goal of establishing open and affirming space for members to share loss and experience with grief, normalize grief experience and provide psycho social education and grief support.  Group drew on narrative and Alderian therapeutic modalities.     Nicole Mahoney attended group.  She was present in group room prior to chaplain arrival.  When chaplain introduced group, she stated, "you're not going to say things to make us cry, are you"?   Throughout group she was somewhat dismissive of topic and process, yet was intrusive while other group members were speaking.  Chaplain explained purpose of group and while engaging in psycho-social ed around grief responses, Nicole Mahoney asked whether grief could be result of losing relationships.  Later she identified with another group member who had left a difficult relationship, stating "that is what I am working on now."  She was not specific about her relationships.  When speaking about coping skills, Nicole Mahoney stated "I think we need to think about the positive.  I don't want to dwell on negative things."  At chaplain's prompting, she described how this had been helpful for her.  Another group member differed with Nicole Mahoney and described his perception of the value of sharing difficult experiences.    Nicole Mahoney, Nicole Mahoney

## 2014-03-14 NOTE — [facility] Group Notes (Signed)
[facility]   03/14/2014 10:12 AM  Participation Quality:  Appropriate   Mood/Affect:  Appropriate  Depression Rating:  2  Anxiety Rating:  0  Thoughts of Suicide:  No Will you contract for safety?   NA  Current AVH:  No  Plan for Discharge/Comments:  Pt reports that she feels better now that she has been put back on mental health meds and is hoping to d/c asap. [facility] [place] and reports that she can return home with her bf at d/c.   Transportation Means: bus pass   Supports: boyfriend   Counselling psychologistmart, OncologistHeather LCSWA
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
## 2014-03-15 DIAGNOSIS — F332 Major depressive disorder, recurrent severe without psychotic features: Secondary | ICD-10-CM

## 2014-03-15 MED ORDER — SERTRALINE HCL 50 MG PO TABS: 50.0000 mg | ORAL_TABLET | Freq: Every day | ORAL | Status: DC

## 2014-03-15 MED ORDER — FAMOTIDINE 20 MG PO TABS: 20.0000 mg | ORAL_TABLET | Freq: Two times a day (BID) | ORAL | Status: DC

## 2014-03-15 MED ORDER — TRAZODONE HCL 50 MG PO TABS: 50.0000 mg | ORAL_TABLET | Freq: Every evening | ORAL | Status: DC | PRN

## 2014-03-15 MED ORDER — IBUPROFEN 600 MG PO TABS: 600.0000 mg | ORAL_TABLET | Freq: Four times a day (QID) | ORAL | Status: DC

## 2014-03-15 MED ORDER — BACITRACIN-POLYMYXIN B 500-10000 UNIT/GM OP OINT: TOPICAL_OINTMENT | Freq: Two times a day (BID) | OPHTHALMIC | Status: DC

## 2014-03-15 MED ORDER — POLYVINYL ALCOHOL 1.4 % OP SOLN: 1.0000 [drp] | OPHTHALMIC | Status: DC | PRN

## 2014-03-15 NOTE — Progress Notes (Signed)
Morning Wellness Group 0930  The focus of this group is to educate the patient on the purpose and policies of crisis stabilization and provide a format to answer questions about their admission.  The group details unit policies and expectations of patients while admitted. 

## 2014-03-15 NOTE — Progress Notes (Signed)
D   Pt is appropriate and pleasant   She reports feeling good because she is going to be discharged tomorrow   She attends groups and interacts well with others   Pt did talk about the allergic reaction she had and why she uses a walker A   Verbal support given   Medications administered and effectiveness monitored   Q 15 min checks R   Pt safe at present

## 2014-03-15 NOTE — Progress Notes (Signed)
  [facility]  Will you be returning to the same living situation after discharge:  Yes,  home with boyfriend At discharge, do you have transportation home?: Yes,  bus pass in chart Do you have the ability to pay for your medications: Yes,  [facility]  Release of information consent forms completed and submitted to Medical Records by CSW.  Patient to Follow up at: Follow-up Information    Follow up with [facility] On 03/29/2014.   Why:  Arrive between 8:30AM-11:30AM for medication management/assessment for therapy services. If you need to be seen before this date, walk in clinic hours are Monday through Friday 8am-5pm.    Contact information:   [address] [place], [place] [address] Phone: [phone] Fax: [phone]      Patient denies SI/HI: Yes,  during group/self report.     Safety Planning and Suicide Prevention discussed: Yes,  Contact attempts made with pt's boyfriend. SPE completed with pt and she was encouraged to share information with support network, ask questions, and talk about any concerns relating to SPE.  Has patient been referred to the Quitline?: Patient refused referral  Smart, Lebron QuamHeather LCSWA  03/15/2014, 10:36 AM
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
## 2014-03-15 NOTE — [facility] Suicide Risk Assessment (Signed)
[facility] Discharge Suicide Risk Assessment   Demographic Factors:  34 year old female, unemployed, has three children  Total Time spent with patient: 30 minutes  Musculoskeletal: Strength & Muscle Tone: within normal limits Gait & Station: walks with walker- of note, states that her leg strength has been consistently improving since her delivery/epidural and she states " I think i won't need it any more in a couple of days"  Patient leans: N/A  Psychiatric Specialty Exam: Physical Exam  ROS  Blood pressure 102/66, pulse 96, temperature 98.2 F (36.8 C), temperature source Oral, resp. rate 16, height 6' (1.829 m), weight 192 lb (87.091 kg), currently breastfeeding.Body mass index is 26.03 kg/(m^2).  General Appearance: improved grooming  Eye Contact::  Good  Speech:  Normal Rate  Volume:  Normal  Mood:  Euthymic  Affect:  Appropriate, Congruent and Full Range  Thought Process:  Goal Directed and Linear  Orientation:  Full (Time, Place, and Person)  Thought Content:  denies hallucinations, no delusions, not internally preoccupied   Suicidal Thoughts:  No- denies any current thoughts of hurting self or anyone else   Homicidal Thoughts:  No  Memory:  recent and remote grossly intact   Judgement:  Other:  improved  Insight:  improving  Psychomotor Activity:  Normal  Concentration:  Good  Recall:  Good  Fund of Knowledge:Good  Language: Good  Akathisia:  Negative  Handed:  Right  AIMS (if indicated):     Assets:  Desire for Improvement Physical Health Resilience  Sleep:  Number of Hours: 6.75  Cognition: WNL  ADL's: improved    Have you used any form of tobacco in the last 30 days? (Cigarettes, Smokeless Tobacco, Cigars, and/or Pipes): No  Has this patient used any form of tobacco in the last 30 days? (Cigarettes, Smokeless Tobacco, Cigars, and/or Pipes) No  Mental Status Per Nursing Assessment::   On Admission:  Suicidal ideation indicated by others  Current Mental Status  by Physician: At this time patient is calm, euthymic, with full range of affect, mood is improved and seems euthymic, affect is appropriate, no thought disorder, no SI or HI, no psychotic symptoms and future oriented  Loss Factors: Strained  relationship with father of her children, unemployment, limited support network  Historical Factors: History of Cocaine dependence, history of substance induced depression, denies prior history of suicide attempts   Risk Reduction Factors:   Responsible for children under 32 years of age, Sense of responsibility to family and Positive coping skills or problem solving skills  Continued Clinical Symptoms:  As noted, at this time patient much improved and seems euthymic, with a full range of affect, no thought disorder, no SI or HI, no psychotic symptoms and future oriented. Denies any current cravings.  Cognitive Features That Contribute To Risk:  No gross cognitive deficits noted upon discharge. Is alert , attentive, and oriented x 3   Suicide Risk:  Mild:  Suicidal ideation of limited frequency, intensity, duration, and specificity.  There are no identifiable plans, no associated intent, mild dysphoria and related symptoms, good self-control (both objective and subjective assessment), few other risk factors, and identifiable protective factors, including available and accessible social support.  Principal Problem:  Substance Dependence, Substance Induced Mood Disorder Discharge Diagnoses:  Patient Active Problem List   Diagnosis Date Noted  . Cocaine use disorder, moderate, dependence [F14.20]   . Substance induced mood disorder [F19.94]   . MDD (major depressive disorder) [F32.2] 03/12/2014  . Psychoactive substance-induced mood disorder [  F19.94, F06.30] 03/10/2014  . Altered mental status [R41.82] 03/09/2014  . Somnolence [R40.0] 03/09/2014  . Cocaine abuse [F14.10]   . Acute allergic reaction [T78.40XA]   . Decreased visual acuity [H54.7]   .  Left sided numbness [R20.0]   . Drug abuse [F19.10]   . Non-reactive NST (non-stress test) [O28.9]   . [redacted] weeks gestation of pregnancy [Z3A.37]   . Fetal distress [O77.9] 02/22/2014  . [redacted] weeks gestation of pregnancy [Z3A.27]   . Abdominal trauma [S39.91XA]   . Illicit drug use [F19.90]   . Insufficient prenatal care [O09.30]     Follow-up Information    Follow up with [facility] On 03/29/2014.   Why:  Arrive between 8:30AM-11:30AM for medication management/assessment for therapy services. If you need to be seen before this date, walk in clinic hours are Monday through Friday 8am-5pm.    Contact information:   [address] [place], [place] [address] Phone: [phone] Fax: [phone]      Plan Of Care/Follow-up recommendations:  Activity:  As tolerated Diet:  Regular Tests:  NA Other:  see below  Is patient on multiple antipsychotic therapies at discharge:  No   Has Patient had three or more failed trials of antipsychotic monotherapy by history:  No  Recommended Plan for Multiple Antipsychotic Therapies: NA  Patient is requesting discharge and at this time there are no grounds for any involuntary commitment. She is leaving unit in good spirits. Plans to return  To live with BF, whom she states is supportive. Encouraged to go to NA or AA regularly. Patient to follow up with her Ob/Gyn specialist as S/P delivery 3-4 weeks ago.    Laelani Vasko 03/15/2014, 12:08 PM

## 2014-03-15 NOTE — [facility] Group Notes (Signed)
[facility] LCSW Group Therapy  03/15/2014 1:22 PM  Type of Therapy:  Group Therapy  Participation Level:  Active  Participation Quality:  Attentive  Affect:  Appropriate  Cognitive:  Alert and Oriented  Insight:  Engaged  Engagement in Therapy:  Engaged  Modes of Intervention:  Discussion, Education, Exploration, Problem-solving, Rapport Building, Socialization and Support  Summary of Progress/Problems: [facility] Speaker came to talk about his personal journey with substance abuse and addiction. The pt processed ways by which to relate to the speaker. [facility] speaker provided handouts and educational information pertaining to groups and services [facility].   Smart, Anamae Rochelle LCSWA 03/15/2014, 1:22 PM

## 2014-03-15 NOTE — [facility] Suicide Risk Assessment (Signed)
[facility] INPATIENT:  Family/Significant Other Suicide Prevention Education  Suicide Prevention Education:  Contact Attempts: Mervyn SkeetersFrederick Jones (pt's boyfriend) [phone] has been identified by the patient as the family member/significant other with whom the patient will be residing, and identified as the person(s) who will aid the patient in the event of a mental health crisis.  With written consent from the patient, two attempts were made to provide suicide prevention education, prior to and/or following the patient's discharge.  We were unsuccessful in providing suicide prevention education.  A suicide education pamphlet was given to the patient to share with family/significant other.  Date and time of first attempt: 03/14/14-no answer/vm full  Date and time of second attempt: 03/15/14-no answer/vm full  Smart, Aleicia Kenagy LCSWA  03/15/2014, 10:35 AM

## 2014-03-15 NOTE — Progress Notes (Signed)
D: Patient unable to get up this morning due to "tiredness."  She attributes her lethargy to the medications she is taking. She is pleasant and cooperative.  Patient was joking around with staff and roommate this morning.  She denies SI/HI/AVH.  She reports minimal withdrawal symptoms.  Patient continues to use drops and ointment for swelling around right eye. A: Continue to monitor medication management and MD orders.  Safety checks completed every 15 minutes per protocol.  Meet 1:1 with patient to address needs and offer encouragement. R: Patient's behavior is appropriate to situation.

## 2014-03-15 NOTE — [facility] Group Notes (Signed)
[facility] LCSW Group Therapy  03/15/2014 12:39 PM  Type of Therapy:  Group Therapy  Participation Level:  Active  Participation Quality:  Attentive  Affect:  Appropriate  Cognitive:  Oriented  Insight:  Improving  Engagement in Therapy:  Engaged  Modes of Intervention:  Confrontation, Discussion, Education, Exploration, Problem-solving, Rapport Building, Socialization and Support  Summary of Progress/Problems: Today's Topic: Overcoming Obstacles. Pt identified obstacles faced currently and processed barriers involved in overcoming these obstacles. Pt identified steps necessary for overcoming these obstacles and explored motivation (internal and external) for facing these difficulties head on. Pt further identified one area of concern in their lives and chose a skill of focus pulled from their "toolbox." Nicole Mahoney was attentive and engaged during today's processing group. She shared that she does not anticipate any obstacles at discharge other than staying clean. She shared that "going back to [facility] and actually going to my appts" will help her stay med compliant and thus, reduce her desire to use drugs. 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 She reports being ready to d/c soon and reports a good support system ("mainly my baby daddy.")  Smart, Lebron QuamHeather LCSWA  03/15/2014, 12:39 PM

## 2014-03-15 NOTE — Progress Notes (Signed)
Nursing discharge note:  Patient discharged home per MD order.  Patient is to follow up with [facility].  She denies SI/HI/AVH.  Patient left without incident with her boyfriend.

## 2014-03-18 NOTE — Progress Notes (Signed)
Patient Discharge Instructions:  After Visit Summary (AVS):   Faxed to:  03/18/14 Discharge Summary Note:   Faxed to:  03/18/14 Psychiatric Admission Assessment Note:   Faxed to:  03/18/14 Suicide Risk Assessment - Discharge Assessment:   Faxed to:  03/18/14 Faxed/Sent to the Next Level Care provider:  03/18/14 Faxed to [facility] @ [phone]  Jerelene ReddenSheena E [place], 03/18/2014, 2:19 PM

## 2014-03-28 ENCOUNTER — Encounter (HOSPITAL_COMMUNITY): Payer: Self-pay | Admitting: Anesthesiology

## 2014-03-28 NOTE — Anesthesia Preprocedure Evaluation (Deleted)
Anesthesia Evaluation  Patient identified by MRN, date of birth, ID band Patient awake    Reviewed: Allergy & Precautions, NPO status , Patient's Chart, lab work & pertinent test results  History of Anesthesia Complications Negative for: history of anesthetic complications  Airway Mallampati: II  TM Distance: >3 FB Neck ROM: Full    Dental no notable dental hx. (+) Dental Advisory Given   Pulmonary asthma , Current Smoker,  breath sounds clear to auscultation  Pulmonary exam normal       Cardiovascular hypertension, Rhythm:Regular Rate:Normal     Neuro/Psych PSYCHIATRIC DISORDERS Anxiety Depression negative neurological ROS     GI/Hepatic negative GI ROS, (+)     substance abuse  cocaine use and marijuana use,   Endo/Other  negative endocrine ROS  Renal/GU negative Renal ROS  negative genitourinary   Musculoskeletal negative musculoskeletal ROS (+)   Abdominal   Peds negative pediatric ROS (+)  Hematology negative hematology ROS (+)   Anesthesia Other Findings   Reproductive/Obstetrics negative OB ROS                             Anesthesia Physical Anesthesia Plan  ASA: III  Anesthesia Plan: General   Post-op Pain Management:    Induction: Intravenous  Airway Management Planned: Oral ETT  Additional Equipment:   Intra-op Plan:   Post-operative Plan: Extubation in OR  Informed Consent: I have reviewed the patients History and Physical, chart, labs and discussed the procedure including the risks, benefits and alternatives for the proposed anesthesia with the patient or authorized representative who has indicated his/her understanding and acceptance.   Dental advisory given  Plan Discussed with: CRNA  Anesthesia Plan Comments:         Anesthesia Quick Evaluation

## 2014-03-29 ENCOUNTER — Encounter (HOSPITAL_COMMUNITY): Admission: RE | Payer: Self-pay | Source: Ambulatory Visit

## 2014-03-29 ENCOUNTER — Ambulatory Visit (HOSPITAL_COMMUNITY)
Admission: RE | Admit: 2014-03-29 | Payer: Medicaid Other | Source: Ambulatory Visit | Admitting: Obstetrics & Gynecology

## 2014-03-29 SURGERY — LIGATION, FALLOPIAN TUBE, LAPAROSCOPIC
Anesthesia: Choice | Site: Abdomen | Laterality: Bilateral

## 2014-04-13 ENCOUNTER — Emergency Department (HOSPITAL_COMMUNITY): Payer: Medicaid Other

## 2014-04-13 ENCOUNTER — Emergency Department (HOSPITAL_COMMUNITY)
Admission: EM | Admit: 2014-04-13 | Discharge: 2014-04-14 | Disposition: A | Discharge status: Home / Self Care | Payer: Medicaid Other | Attending: Emergency Medicine | Admitting: Emergency Medicine

## 2014-04-13 ENCOUNTER — Encounter (HOSPITAL_COMMUNITY): Payer: Self-pay | Admitting: Emergency Medicine

## 2014-04-13 DIAGNOSIS — F17210 Nicotine dependence, cigarettes, uncomplicated: Secondary | ICD-10-CM | POA: Diagnosis not present

## 2014-04-13 DIAGNOSIS — M79672 Pain in left foot: Secondary | ICD-10-CM | POA: Insufficient documentation

## 2014-04-13 DIAGNOSIS — Z792 Long term (current) use of antibiotics: Secondary | ICD-10-CM | POA: Insufficient documentation

## 2014-04-13 DIAGNOSIS — Z880 Allergy status to penicillin: Secondary | ICD-10-CM | POA: Insufficient documentation

## 2014-04-13 DIAGNOSIS — Z3202 Encounter for pregnancy test, result negative: Secondary | ICD-10-CM | POA: Insufficient documentation

## 2014-04-13 DIAGNOSIS — O1093 Unspecified pre-existing hypertension complicating the puerperium: Secondary | ICD-10-CM | POA: Insufficient documentation

## 2014-04-13 DIAGNOSIS — Z79899 Other long term (current) drug therapy: Secondary | ICD-10-CM | POA: Insufficient documentation

## 2014-04-13 DIAGNOSIS — O99335 Smoking (tobacco) complicating the puerperium: Secondary | ICD-10-CM | POA: Insufficient documentation

## 2014-04-13 DIAGNOSIS — J45909 Unspecified asthma, uncomplicated: Secondary | ICD-10-CM | POA: Diagnosis not present

## 2014-04-13 DIAGNOSIS — N939 Abnormal uterine and vaginal bleeding, unspecified: Secondary | ICD-10-CM

## 2014-04-13 DIAGNOSIS — O9989 Other specified diseases and conditions complicating pregnancy, childbirth and the puerperium: Secondary | ICD-10-CM | POA: Insufficient documentation

## 2014-04-13 DIAGNOSIS — O9953 Diseases of the respiratory system complicating the puerperium: Secondary | ICD-10-CM | POA: Insufficient documentation

## 2014-04-13 DIAGNOSIS — M79673 Pain in unspecified foot: Secondary | ICD-10-CM

## 2014-04-13 LAB — I-STAT CHEM 8, ED
BUN: 11 mg/dL (ref 6–23)
CREATININE: 1 mg/dL (ref 0.50–1.10)
Calcium, Ion: 1.16 mmol/L (ref 1.12–1.23)
Chloride: 106 mmol/L (ref 96–112)
Glucose, Bld: 105 mg/dL — ABNORMAL HIGH (ref 70–99)
HEMATOCRIT: 40 % (ref 36.0–46.0)
HEMOGLOBIN: 13.6 g/dL (ref 12.0–15.0)
Potassium: 3.7 mmol/L (ref 3.5–5.1)
SODIUM: 143 mmol/L (ref 135–145)
TCO2: 22 mmol/L (ref 0–100)

## 2014-04-13 LAB — CBC
HCT: 38 % (ref 36.0–46.0)
Hemoglobin: 12.2 g/dL (ref 12.0–15.0)
MCH: 30 pg (ref 26.0–34.0)
MCHC: 32.1 g/dL (ref 30.0–36.0)
MCV: 93.4 fL (ref 78.0–100.0)
Platelets: 232 10*3/uL (ref 150–400)
RBC: 4.07 MIL/uL (ref 3.87–5.11)
RDW: 13.2 % (ref 11.5–15.5)
WBC: 7.4 10*3/uL (ref 4.0–10.5)

## 2014-04-13 LAB — ETHANOL: Alcohol, Ethyl (B): <5 mg/dL (ref 0–9)

## 2014-04-13 LAB — I-STAT BETA HCG BLOOD, ED (MC, WL, AP ONLY): I-stat hCG, quantitative: <5 m[IU]/mL (ref <5)
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
## 2014-04-13 NOTE — ED Provider Notes (Signed)
MSE was initiated and I personally evaluated the patient and placed orders (if any) at  9:23 PM on April 13, 2014.  Nicole Mahoney is a 34 y.o. female brought in by EMS, who presents to the Emergency Department complaining of severe, continued vaginal bleeding for the past two months. She states she gave birth approximately 6 weeks ago and has been bleeding every day since this time. Pt states she is experiencing associated nausea and dizziness. Reports fever Tmax 101 degrees three days ago. She also reports abdominal pain around her umbilicus. Pt states she has a doctor's appointment in nine days on 04/22/14. PMHx of asthma, HTN and polysubstance abuse. ETOH on board tonight. She states she was having L foot pain, but doesn't care about that pain and that's not why she came here.  Orthostats ordered as well as CBC, chem-8, hCG, UA, UDS, and ethanol. DG foot in process, ordered in triage after arrival.  The patient appears stable so that the remainder of the MSE may be completed by another provider.  Antony MaduraKelly Mercadez Heitman, PA-C 04/13/14 2125  Rolan BuccoMelanie Belfi, MD 04/13/14 2256

## 2014-04-13 NOTE — ED Notes (Signed)
Patient refusing to provide urine.

## 2014-04-13 NOTE — ED Notes (Signed)
Patient refusing to be discharged, have VS taken and continues to refuse to cooperate with plan of care Patient informed that she has been DC from the ED for refusing all ordered testing, which was previously explained to patient by nursing and medical staff Patient stated that she needed to call for a ride since the bus line is no longer running--phone provided to patient

## 2014-04-13 NOTE — ED Notes (Signed)
Patient was informed that a pelvic exam was ordered.  Patient refused to take undergarments off for pelvic. PA made aware.

## 2014-04-13 NOTE — ED Notes (Signed)
Patient will not answer questions when asked by this nurse r/t vaginal bleeding Patient will open her eyes and answer questions, but then when pressed for details will then roll her eyes and pull covers over her head Patient has had intermittent vaginal bleeding since January when she delivered  Patient states that she currently has a pad in place, but will not tell this nurse when pad was applied or how many pads she has soaked through  Patient remains in NAD

## 2014-04-13 NOTE — ED Notes (Signed)
Pt now also c/o vaginal bleeding x 2 weeks post delivery. Delivered 1/20.

## 2014-04-13 NOTE — ED Notes (Signed)
Bed: [id number] Expected date:  Expected time:  Means of arrival:  Comments: EMS 34 yo left foot pain

## 2014-04-13 NOTE — ED Provider Notes (Signed)
CSN: [id number]     Arrival date & time 04/13/14  2025 History   First MD Initiated Contact with Patient 04/13/14 2050     Chief Complaint  Patient presents with  . Foot Pain  . Vaginal Bleeding     (Consider location/radiation/quality/duration/timing/severity/associated sxs/prior Treatment) HPI Nicole Mahoney is a 34 y.o. female who comes in for evaluation of postpartum bleeding. Patient appears tired and gives a poor effort and history. Patient states that on January 20 she gave birth to her baby via vaginal birth without complications. She reports since then she has had intermittent vaginal bleeding that alternates between "dark brown and bright red clots". Reports she has gone as long as two weeks without bleeding. Cannot tell how many pads she is using. She reports having a follow-up appointment with her OB on March 18. She reports associated intermittent dizziness where "it feels like things are spinning". She also reports associated abdominal discomfort diffusely. She denies fevers, chest pain, shortness of breath, palpitations, fatigue, numbness or weakness, syncope, rash.  Past Medical History  Diagnosis Date  . Bronchitis, acute   . Asthma   . Hypertension   . Drug abuse, cocaine type   . Drug abuse, marijuana    Past Surgical History  Procedure Laterality Date  . Hand surgery    . Appendectomy     Family History  Problem Relation Age of Onset  . Cancer Other   . Asthma Mother   . Asthma Father   . Depression Father   . Depression Sister   . Depression Brother   . Diabetes Paternal Grandmother    History  Substance Use Topics  . Smoking status: Current Every Day Smoker -- 0.25 packs/day    Types: Cigarettes  . Smokeless tobacco: Never Used  . Alcohol Use: Yes     Comment: staates alcohol at 3 months pregnat but none since   OB History    Gravida Para Term Preterm AB TAB SAB Ectopic Multiple Living   4 3 1       0 1     Review of Systems A 10 point review  of systems was completed and was negative except for pertinent positives and negatives as mentioned in the history of present illness     Allergies  Penicillins and Percocet  Home Medications   Prior to Admission medications   Medication Sig Start Date End Date Taking? Authorizing Provider  bacitracin-polymyxin b (POLYSPORIN) ophthalmic ointment Place into the right eye 2 (two) times daily. apply to eye every 12 hours while awake 03/15/14   Thermon LeylandLaura A Davis, NP  diphenhydrAMINE (BENADRYL) 25 MG tablet Take 1 tablet (25 mg total) by mouth every 6 (six) hours. 03/09/14   Marisa Severinlga Otter, MD  EPINEPHrine 0.3 mg/0.3 mL IJ SOAJ injection Inject 0.3 mLs (0.3 mg total) into the muscle once as needed (for allergic reaction). 03/12/14   Yolanda MangesAlex M Wilson, DO  famotidine (PEPCID) 20 MG tablet Take 1 tablet (20 mg total) by mouth 2 (two) times daily. 03/15/14   Thermon LeylandLaura A Davis, NP  ibuprofen (ADVIL,MOTRIN) 600 MG tablet Take 1 tablet (600 mg total) by mouth every 6 (six) hours. For pain 03/15/14   Thermon LeylandLaura A Davis, NP  polyvinyl alcohol (LIQUIFILM TEARS) 1.4 % ophthalmic solution Place 1 drop into the left eye as needed for dry eyes. 03/15/14   Thermon LeylandLaura A Davis, NP  sertraline (ZOLOFT) 50 MG tablet Take 1 tablet (50 mg total) by mouth daily. 03/15/14   Gerome ApleyLaura A  Earlene Plater, NP  traZODone (DESYREL) 50 MG tablet Take 1 tablet (50 mg total) by mouth at bedtime as needed for sleep. 03/15/14   Thermon Leyland, NP   BP 117/51 mmHg  Pulse 70  Temp(Src) 98.2 F (36.8 C) (Oral)  Resp 18  SpO2 95%  Breastfeeding? Unknown Physical Exam  Constitutional: She is oriented to person, place, and time. She appears well-developed and well-nourished.  Patient sleeping and gives poor effort for physical exam.   HENT:  Head: Normocephalic and atraumatic.  Mouth/Throat: Oropharynx is clear and moist.  Eyes: Conjunctivae are normal. Pupils are equal, round, and reactive to light. Right eye exhibits no discharge. Left eye exhibits no discharge. No scleral  icterus.  Neck: Neck supple.  Cardiovascular: Normal rate, regular rhythm and normal heart sounds.   Pulmonary/Chest: Effort normal and breath sounds normal. No respiratory distress. She has no wheezes. She has no rales.  Abdominal: Soft. She exhibits no distension and no mass. There is no tenderness. There is no rebound and no guarding.  Musculoskeletal: She exhibits no tenderness.  Neurological: She is alert and oriented to person, place, and time.  Cranial Nerves II-XII grossly intact  Skin: Skin is warm and dry. No rash noted.  Psychiatric: She has a normal mood and affect.  Nursing note and vitals reviewed.   ED Course  Procedures (including critical care time) Labs Review Labs Reviewed  I-STAT CHEM 8, ED - Abnormal; Notable for the following:    Glucose, Bld 105 (*)    All other components within normal limits  CBC  ETHANOL  I-STAT BETA HCG BLOOD, ED ([facility], [facility], [facility] ONLY)    Imaging Review Dg Foot Complete Left  04/13/2014   CLINICAL DATA:  Left foot pain across the metatarsals.  EXAM: LEFT FOOT - COMPLETE 3+ VIEW  COMPARISON:  None.  FINDINGS: There is no evidence of fracture or dislocation. There is no evidence of arthropathy or other focal bone abnormality. Soft tissues are unremarkable.  IMPRESSION: Negative.   Electronically Signed   By: Richarda Overlie M.D.   On: 04/13/2014 21:35     EKG Interpretation None     Meds given in ED:  Medications - No data to display  Discharge Medication List as of 04/13/2014 11:37 PM     Filed Vitals:   04/13/14 2214 04/13/14 2248 04/13/14 2255 04/13/14 2330  BP: 138/64 107/51  117/51  Pulse: 73 72 79 70  Temp:      TempSrc:      Resp: 18     SpO2: 96% 95% 99% 95%    MDM  Vitals stable - WNL -afebrile Pt resting comfortably in ED. Sleeping throughout ED stay. Refuses Pelvic exam and is uncooperative with staff and ED providers. PE--mild diffuse abdominal tenderness, but otherwise unremarkable. Labwork noncontributory, no evidence  of anemia. Refuses urinalysis  DDX--patient refuses pelvic exam and urinalysis at this time. Unable to evaluate symptoms for further pathology. Patient reports she has follow-up with her OB on March 18. Denies any foot pain at this time.   I discussed all relevant lab findings and imaging results with pt and they verbalized understanding. Discussed f/u with PCP within 48 hrs and return precautions, pt very amenable to plan.  Prior to patient discharge, I discussed and reviewed this case with Dr.Belfi    Final diagnoses:  Foot pain  Vaginal bleeding       Joycie Peek, PA-C 04/14/14 1044  Rolan Bucco, MD 04/17/14 1746

## 2014-04-13 NOTE — ED Notes (Signed)
EDP at bedside  

## 2014-04-13 NOTE — Discharge Instructions (Signed)
Abnormal Uterine Bleeding Abnormal uterine bleeding means bleeding from the vagina that is not your normal menstrual period. This can be:  Bleeding or spotting between periods.  Bleeding after sex (sexual intercourse).  Bleeding that is heavier or more than normal.  Periods that last longer than usual.  Bleeding after menopause. There are many problems that may cause this. Treatment will depend on the cause of the bleeding. Any kind of bleeding that is not normal should be reviewed by your doctor.  HOME CARE Watch your condition for any changes. These actions may lessen any discomfort you are having:  Do not use tampons or douches as told by your doctor.  Change your pads often. You should get regular pelvic exams and Pap tests. Keep all appointments for tests as told by your doctor. GET HELP IF:  You are bleeding for more than 1 week.  You feel dizzy at times. GET HELP RIGHT AWAY IF:   You pass out.  You have to change pads every 15 to 30 minutes.  You have belly pain.  You have a fever.  You become sweaty or weak.  You are passing large blood clots from the vagina.  You feel sick to your stomach (nauseous) and throw up (vomit). MAKE SURE YOU:  Understand these instructions.  Will watch your condition.  Will get help right away if you are not doing well or get worse. Document Released: 11/18/2008 Document Revised: 01/26/2013 Document Reviewed: 08/20/2012 [facility] Patient Information 2015 [place], [place]. This information is not intended to replace advice given to you by your health care provider. Make sure you discuss any questions you have with your health care provider.  You were evaluated in the ED today for your complaint of vaginal bleeding. You have refused a pelvic exam at this time. It is important for you to follow-up with your OB or primary care for further evaluation and management of your symptoms. Return to ED for new or worsening symptoms.

## 2014-04-13 NOTE — ED Notes (Addendum)
Patient alert and oriented x 4, but obviously intoxicated RR WNL--spontaneous, even and unlabored with equal rise and fall of chest Patient in NAD Side rails up, call bell in reach

## 2014-04-14 NOTE — ED Notes (Signed)
Patient escorted from ED by security staff Patient in NAD upon leaving ED Patient alert and oriented x 4 and ambulatory without assistance or difficulty

## 2014-06-03 ENCOUNTER — Emergency Department (HOSPITAL_COMMUNITY)
Admission: EM | Admit: 2014-06-03 | Discharge: 2014-06-03 | Disposition: A | Discharge status: Home / Self Care | Payer: Medicaid Other | Attending: Emergency Medicine | Admitting: Emergency Medicine

## 2014-06-03 ENCOUNTER — Encounter (HOSPITAL_COMMUNITY): Payer: Self-pay | Admitting: Emergency Medicine

## 2014-06-03 DIAGNOSIS — Z3202 Encounter for pregnancy test, result negative: Secondary | ICD-10-CM | POA: Diagnosis not present

## 2014-06-03 DIAGNOSIS — Z8659 Personal history of other mental and behavioral disorders: Secondary | ICD-10-CM | POA: Diagnosis not present

## 2014-06-03 DIAGNOSIS — I10 Essential (primary) hypertension: Secondary | ICD-10-CM | POA: Insufficient documentation

## 2014-06-03 DIAGNOSIS — R109 Unspecified abdominal pain: Secondary | ICD-10-CM | POA: Diagnosis present

## 2014-06-03 DIAGNOSIS — R112 Nausea with vomiting, unspecified: Secondary | ICD-10-CM | POA: Diagnosis not present

## 2014-06-03 DIAGNOSIS — Z79899 Other long term (current) drug therapy: Secondary | ICD-10-CM | POA: Insufficient documentation

## 2014-06-03 DIAGNOSIS — Z9049 Acquired absence of other specified parts of digestive tract: Secondary | ICD-10-CM | POA: Diagnosis not present

## 2014-06-03 DIAGNOSIS — Z720 Tobacco use: Secondary | ICD-10-CM | POA: Diagnosis not present

## 2014-06-03 DIAGNOSIS — Z880 Allergy status to penicillin: Secondary | ICD-10-CM | POA: Diagnosis not present

## 2014-06-03 DIAGNOSIS — J45909 Unspecified asthma, uncomplicated: Secondary | ICD-10-CM | POA: Insufficient documentation

## 2014-06-03 LAB — COMPREHENSIVE METABOLIC PANEL WITH GFR
ALT: 23 U/L (ref 0–35)
AST: 25 U/L (ref 0–37)
Albumin: 4 g/dL (ref 3.5–5.2)
Alkaline Phosphatase: 66 U/L (ref 39–117)
Anion gap: 9 (ref 5–15)
BUN: 13 mg/dL (ref 6–23)
CO2: 23 mmol/L (ref 19–32)
Calcium: 9 mg/dL (ref 8.4–10.5)
Chloride: 105 mmol/L (ref 96–112)
Creatinine, Ser: 1.13 mg/dL — ABNORMAL HIGH (ref 0.50–1.10)
GFR calc Af Amer: 74 mL/min — ABNORMAL LOW
GFR calc non Af Amer: 64 mL/min — ABNORMAL LOW
Glucose, Bld: 97 mg/dL (ref 70–99)
Potassium: 4.1 mmol/L (ref 3.5–5.1)
Sodium: 137 mmol/L (ref 135–145)
Total Bilirubin: 0.4 mg/dL (ref 0.3–1.2)
Total Protein: 7.3 g/dL (ref 6.0–8.3)

## 2014-06-03 LAB — CBC WITH DIFFERENTIAL/PLATELET
BASOS ABS: 0 10*3/uL (ref 0.0–0.1)
Basophils Relative: 0 % (ref 0–1)
Eosinophils Absolute: 0.2 10*3/uL (ref 0.0–0.7)
Eosinophils Relative: 3 % (ref 0–5)
HCT: 39.2 % (ref 36.0–46.0)
Hemoglobin: 13.1 g/dL (ref 12.0–15.0)
LYMPHS ABS: 4.1 10*3/uL — AB (ref 0.7–4.0)
Lymphocytes Relative: 49 % — ABNORMAL HIGH (ref 12–46)
MCH: 30.2 pg (ref 26.0–34.0)
MCHC: 33.4 g/dL (ref 30.0–36.0)
MCV: 90.3 fL (ref 78.0–100.0)
MONO ABS: 0.5 10*3/uL (ref 0.1–1.0)
Monocytes Relative: 6 % (ref 3–12)
NEUTROS ABS: 3.5 10*3/uL (ref 1.7–7.7)
Neutrophils Relative %: 42 % — ABNORMAL LOW (ref 43–77)
PLATELETS: 263 10*3/uL (ref 150–400)
RBC: 4.34 MIL/uL (ref 3.87–5.11)
RDW: 13.3 % (ref 11.5–15.5)
WBC: 8.4 10*3/uL (ref 4.0–10.5)

## 2014-06-03 LAB — LIPASE, BLOOD: LIPASE: 47 U/L (ref 11–59)

## 2014-06-03 LAB — POC URINE PREG, ED: Preg Test, Ur: NEGATIVE

## 2014-06-03 MED ORDER — ONDANSETRON HCL 8 MG PO TABS: 8.0000 mg | ORAL_TABLET | Freq: Three times a day (TID) | ORAL | Status: DC | PRN

## 2014-06-03 NOTE — ED Notes (Signed)
Patient reports lower abdominal pain, and cramping. She suspects she is pregnant because "my period is a few weeks late". Last menstral period: April 07, 2014. 1 emesis episode in the last 24 hours.

## 2014-06-03 NOTE — Discharge Instructions (Signed)

## 2014-06-03 NOTE — ED Provider Notes (Signed)
CSN: [id number]     Arrival date & time 06/03/14  0510 History   First MD Initiated Contact with Patient 06/03/14 [phone]     Chief Complaint  Patient presents with  . Abdominal Pain     (Consider location/radiation/quality/duration/timing/severity/associated sxs/prior Treatment) HPI  Deondrea Temple PaciniM Mcfall is a 34 y.o. female who is here for evaluation of 4 days, vomiting, and skipped past menses. LMP 04/07/2014. At the time I saw her. She denies abdominal pain. She states she has vomiting without diarrhea. She denies chills, cough, shortness of breath, back pain. She states she was awake all night and just needs to sleep. There are no other known modifying factors.     Past Medical History  Diagnosis Date  . Bronchitis, acute   . Asthma   . Hypertension   . Drug abuse, cocaine type   . Drug abuse, marijuana    Past Surgical History  Procedure Laterality Date  . Hand surgery    . Appendectomy     Family History  Problem Relation Age of Onset  . Cancer Other   . Asthma Mother   . Asthma Father   . Depression Father   . Depression Sister   . Depression Brother   . Diabetes Paternal Grandmother    History  Substance Use Topics  . Smoking status: Current Every Day Smoker -- 0.25 packs/day    Types: Cigarettes  . Smokeless tobacco: Never Used  . Alcohol Use: Yes     Comment: "occasional drinks"   OB History    Gravida Para Term Preterm AB TAB SAB Ectopic Multiple Living   4 3 1       0 1     Review of Systems  All other systems reviewed and are negative.     Allergies  Penicillins and Percocet  Home Medications   Prior to Admission medications   Medication Sig Start Date End Date Taking? Authorizing Provider  bacitracin-polymyxin b (POLYSPORIN) ophthalmic ointment Place into the right eye 2 (two) times daily. apply to eye every 12 hours while awake 03/15/14   Thermon LeylandLaura A Davis, NP  diphenhydrAMINE (BENADRYL) 25 MG tablet Take 1 tablet (25 mg total) by mouth every 6  (six) hours. 03/09/14   Marisa Severinlga Otter, MD  EPINEPHrine 0.3 mg/0.3 mL IJ SOAJ injection Inject 0.3 mLs (0.3 mg total) into the muscle once as needed (for allergic reaction). 03/12/14   Yolanda MangesAlex M Wilson, DO  famotidine (PEPCID) 20 MG tablet Take 1 tablet (20 mg total) by mouth 2 (two) times daily. 03/15/14   Thermon LeylandLaura A Davis, NP  ibuprofen (ADVIL,MOTRIN) 600 MG tablet Take 1 tablet (600 mg total) by mouth every 6 (six) hours. For pain 03/15/14   Thermon LeylandLaura A Davis, NP  ondansetron (ZOFRAN) 8 MG tablet Take 1 tablet (8 mg total) by mouth every 8 (eight) hours as needed for nausea or vomiting. 06/03/14   Mancel BaleElliott Chene Kasinger, MD  polyvinyl alcohol (LIQUIFILM TEARS) 1.4 % ophthalmic solution Place 1 drop into the left eye as needed for dry eyes. 03/15/14   Thermon LeylandLaura A Davis, NP  sertraline (ZOLOFT) 50 MG tablet Take 1 tablet (50 mg total) by mouth daily. 03/15/14   Thermon LeylandLaura A Davis, NP  traZODone (DESYREL) 50 MG tablet Take 1 tablet (50 mg total) by mouth at bedtime as needed for sleep. 03/15/14   Thermon LeylandLaura A Davis, NP   BP 128/86 mmHg  Pulse 64  Temp(Src) 98.3 F (36.8 C) (Oral)  Resp 15  Ht 5\' 10"  (1.778 m)  Wt 192 lb (87.091 kg)  BMI 27.55 kg/m2  SpO2 96%  LMP 04/07/2014 (Approximate) Physical Exam  Constitutional: She is oriented to person, place, and time. She appears well-developed and well-nourished. No distress.  Patient sleeping on arrival to room, she was difficult to wake up, and falls asleep while talking to me. She appears very tired.  HENT:  Head: Normocephalic and atraumatic.  Right Ear: External ear normal.  Left Ear: External ear normal.  Eyes: Conjunctivae and EOM are normal. Pupils are equal, round, and reactive to light.  Neck: Normal range of motion and phonation normal. Neck supple.  Cardiovascular: Normal rate, regular rhythm and normal heart sounds.   Pulmonary/Chest: Effort normal and breath sounds normal. She exhibits no bony tenderness.  Abdominal: Soft. She exhibits no mass. There is no tenderness. There is no  guarding.  Musculoskeletal: Normal range of motion.  Neurological: She is alert and oriented to person, place, and time. No cranial nerve deficit or sensory deficit. She exhibits normal muscle tone. Coordination normal.  Skin: Skin is warm, dry and intact.  Psychiatric: She has a normal mood and affect. Her behavior is normal. Judgment and thought content normal.  Nursing note and vitals reviewed.   ED Course  Procedures (including critical care time)  Findings discussed with patient, all questions answered.  Labs Review Labs Reviewed  CBC WITH DIFFERENTIAL/PLATELET - Abnormal; Notable for the following:    Neutrophils Relative % 42 (*)    Lymphocytes Relative 49 (*)    Lymphs Abs 4.1 (*)    All other components within normal limits  COMPREHENSIVE METABOLIC PANEL - Abnormal; Notable for the following:    Creatinine, Ser 1.13 (*)    GFR calc non Af Amer 64 (*)    GFR calc Af Amer 74 (*)    All other components within normal limits  LIPASE, BLOOD  POC URINE PREG, ED    Imaging Review No results found.   EKG Interpretation None      MDM   Final diagnoses:  Nausea and vomiting, vomiting of unspecified type    Nonspecific, vomiting. Patient is not pregnant. There is no evidence for acute metabolic toxic or bacterial infection.   Nursing Notes Reviewed/ Care Coordinated Applicable Imaging Reviewed Interpretation of Laboratory Data incorporated into ED treatment  The patient appears reasonably screened and/or stabilized for discharge and I doubt any other medical condition or other [facility] requiring further screening, evaluation, or treatment in the ED at this time prior to discharge.  Plan: Home Medications- Zofran; Home Treatments- rest; return here if the recommended treatment, does not improve the symptoms; Recommended follow up- PCP prn  Mancel Bale, MD 06/03/14 [phone]

## 2014-07-18 ENCOUNTER — Ambulatory Visit (INDEPENDENT_AMBULATORY_CARE_PROVIDER_SITE_OTHER): Payer: Medicaid Other | Admitting: Obstetrics & Gynecology

## 2014-07-18 ENCOUNTER — Encounter: Payer: Self-pay | Admitting: Obstetrics & Gynecology

## 2014-07-18 ENCOUNTER — Other Ambulatory Visit (HOSPITAL_COMMUNITY)
Admission: RE | Admit: 2014-07-18 | Discharge: 2014-07-18 | Disposition: A | Discharge status: Home / Self Care | Payer: Medicaid Other | Source: Ambulatory Visit | Attending: Obstetrics & Gynecology | Admitting: Obstetrics & Gynecology

## 2014-07-18 VITALS — BP 110/62 | HR 63 | Wt 172.4 lb

## 2014-07-18 DIAGNOSIS — O99340 Other mental disorders complicating pregnancy, unspecified trimester: Secondary | ICD-10-CM

## 2014-07-18 DIAGNOSIS — F1410 Cocaine abuse, uncomplicated: Secondary | ICD-10-CM | POA: Diagnosis not present

## 2014-07-18 DIAGNOSIS — O99320 Drug use complicating pregnancy, unspecified trimester: Secondary | ICD-10-CM

## 2014-07-18 DIAGNOSIS — O0991 Supervision of high risk pregnancy, unspecified, first trimester: Secondary | ICD-10-CM | POA: Diagnosis not present

## 2014-07-18 DIAGNOSIS — Z1151 Encounter for screening for human papillomavirus (HPV): Secondary | ICD-10-CM | POA: Diagnosis present

## 2014-07-18 DIAGNOSIS — Z01419 Encounter for gynecological examination (general) (routine) without abnormal findings: Secondary | ICD-10-CM | POA: Diagnosis present

## 2014-07-18 DIAGNOSIS — N760 Acute vaginitis: Secondary | ICD-10-CM | POA: Diagnosis present

## 2014-07-18 DIAGNOSIS — Z113 Encounter for screening for infections with a predominantly sexual mode of transmission: Secondary | ICD-10-CM | POA: Insufficient documentation

## 2014-07-18 DIAGNOSIS — F329 Major depressive disorder, single episode, unspecified: Secondary | ICD-10-CM

## 2014-07-18 DIAGNOSIS — F1920 Other psychoactive substance dependence, uncomplicated: Secondary | ICD-10-CM

## 2014-07-18 DIAGNOSIS — O99321 Drug use complicating pregnancy, first trimester: Secondary | ICD-10-CM

## 2014-07-18 DIAGNOSIS — F32A Depression, unspecified: Secondary | ICD-10-CM

## 2014-07-18 DIAGNOSIS — O0990 Supervision of high risk pregnancy, unspecified, unspecified trimester: Secondary | ICD-10-CM | POA: Insufficient documentation

## 2014-07-18 DIAGNOSIS — F1921 Other psychoactive substance dependence, in remission: Secondary | ICD-10-CM

## 2014-07-18 MED ORDER — SERTRALINE HCL 50 MG PO TABS: 50.0000 mg | ORAL_TABLET | Freq: Every day | ORAL | Status: DC

## 2014-07-18 MED ORDER — PRENATAL VITAMINS 0.8 MG PO TABS: 1.0000 | ORAL_TABLET | Freq: Every day | ORAL | Status: DC

## 2014-07-18 NOTE — Progress Notes (Signed)
Subjective:    Nicole Mahoney is a 34 y.o. G4P3003 at [redacted]w[redacted]d by today's clinic scan being seen today for her first obstetrical visit.  Her obstetrical history is significant for cocaine abuse for several years.  She has positive drug screens for cocaine dating back to 2010 on chart review.  Reports using cocaine 3 days ago, denies use of other substances.  Was admitted for major depression at [facility] a couple of months ago, is followed by an outpatient mental health provider. Pregnancy history fully reviewed.  Patient reports nausea occasionally but is able to eat some food.  No other concerns.  Filed Vitals:   07/18/14 1445  BP: 110/62  Pulse: 63  Weight: 172 lb 6.4 oz (78.2 kg)    HISTORY: OB History  Gravida Para Term Preterm AB SAB TAB Ectopic Multiple Living  0 3    # Outcome Date GA Lbr Len/2nd Weight Sex Delivery Anes PTL Lv  4 Current           3 Term 02/23/14 [redacted]w[redacted]d 03:29 / 00:04 7 lb 0.7 oz (3.195 kg) M Vag-Spont EPI  Y  2 Term      Vag-Spont     1 Term      Vag-Spont        Past Medical History  Diagnosis Date  . Bronchitis, acute   . Asthma   . Hypertension   . Drug abuse, cocaine type   . Drug abuse, marijuana    Past Surgical History  Procedure Laterality Date  . Hand surgery    . Appendectomy     Family History  Problem Relation Age of Onset  . Cancer Other   . Asthma Mother   . Asthma Father   . Depression Father   . Depression Sister   . Depression Brother   . Diabetes Paternal Grandmother      Exam    Uterus:     Pelvic Exam:    Perineum: No Hemorrhoids, Normal Perineum   Vulva: normal   Vagina:  normal mucosa, normal discharge   Cervix: cervical motion tenderness, multiparous appearance and some bleeding with pap smear   Adnexa: normal adnexa and no mass, fullness, tenderness   Bony Pelvis: average  System: Breast:  normal appearance, no masses or tenderness   Skin: normal coloration and turgor, no rashes   Neurologic:  oriented, normal, negative   Extremities: normal strength, tone, and muscle mass   HEENT PERRLA, extra ocular movement intact and sclera clear, anicteric   Mouth/Teeth mucous membranes moist, pharynx normal without lesions and dental hygiene poor   Neck supple and no masses   Cardiovascular: regular rate and rhythm   Respiratory:  appears well, vitals normal, no respiratory distress, acyanotic, normal RR, chest clear, no wheezing, crepitations, rhonchi, normal symmetric air entry   Abdomen: soft, non-tender; bowel sounds normal; no masses,  no organomegaly   Urinary: urethral meatus normal      Assessment:    Pregnancy: G5P1001 Patient Active Problem List   Diagnosis Date Noted  . Supervision of high-risk pregnancy 07/18/2014  . Cocaine abuse affecting pregnancy, antepartum 07/18/2014  . Depression affecting pregnancy, antepartum 07/18/2014  . 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 MDD (major depressive disorder) 03/12/2014  . 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 Cocaine abuse         Plan:     1. Supervision of high-risk pregnancy, first trimester 2. Cocaine abuse  affecting pregnancy, antepartum - Prenatal Profile - Culture, OB Urine - Hemoglobinopathy evaluation - Alcohol metabolite (ETG), urine - Prescript Monitor Profile(19) - [place] MFM Fetal Nuchal Translucency; Future - Prenatal Multivit-Min-Fe-FA (PRENATAL VITAMINS) 0.8 MG tablet; Take 1 tablet by mouth daily.  Dispense: 30 tablet; Refill: 12 Will random drug screens through out pregnancy; may need antenatal testing later if she continues to use cocaine.  3. Depression affecting pregnancy, antepartum Refilled Sertraline (ZOLOFT) 50 MG tablet; Take 1 tablet (50 mg total) by mouth daily.  Dispense: 30 tablet; Refill: 10 Will continue to follow up with mental health provider  Prenatal vitamins prescribed. Problem list reviewed and updated. Genetic Screening discussed First Screen:  ordered. Ultrasound discussed; fetal survey: to be ordered later. The nature of [facility] with multiple MDs and other Advanced Practice Providers was explained to patient; also emphasized that residents, students are part of our team. Follow up in 4 weeks. No other complaints or concerns.  Routine obstetric precautions reviewed.    Tereso Newcomer , MD  07/19/2014

## 2014-07-18 NOTE — Patient Instructions (Signed)
First Trimester of Pregnancy The first trimester of pregnancy is from week 1 until the end of week 12 (months 1 through 3). A week after a sperm fertilizes an egg, the egg will implant on the wall of the uterus. This embryo will begin to develop into a baby. Genes from you and your partner are forming the baby. The female genes determine whether the baby is a boy or a girl. At 6-8 weeks, the eyes and face are formed, and the heartbeat can be seen on ultrasound. At the end of 12 weeks, all the baby's organs are formed.  Now that you are pregnant, you will want to do everything you can to have a healthy baby. Two of the most important things are to get good prenatal care and to follow your health care provider's instructions. Prenatal care is all the medical care you receive before the baby's birth. This care will help prevent, find, and treat any problems during the pregnancy and childbirth. BODY CHANGES Your body goes through many changes during pregnancy. The changes vary from woman to woman.   You may gain or lose a couple of pounds at first.  You may feel sick to your stomach (nauseous) and throw up (vomit). If the vomiting is uncontrollable, call your health care provider.  You may tire easily.  You may develop headaches that can be relieved by medicines approved by your health care provider.  You may urinate more often. Painful urination may mean you have a bladder infection.  You may develop heartburn as a result of your pregnancy.  You may develop constipation because certain hormones are causing the muscles that push waste through your intestines to slow down.  You may develop hemorrhoids or swollen, bulging veins (varicose veins).  Your breasts may begin to grow larger and become tender. Your nipples may stick out more, and the tissue that surrounds them (areola) may become darker.  Your gums may bleed and may be sensitive to brushing and flossing.  Dark spots or blotches (chloasma,  mask of pregnancy) may develop on your face. This will likely fade after the baby is born.  Your menstrual periods will stop.  You may have a loss of appetite.  You may develop cravings for certain kinds of food.  You may have changes in your emotions from day to day, such as being excited to be pregnant or being concerned that something may go wrong with the pregnancy and baby.  You may have more vivid and strange dreams.  You may have changes in your hair. These can include thickening of your hair, rapid growth, and changes in texture. Some women also have hair loss during or after pregnancy, or hair that feels dry or thin. Your hair will most likely return to normal after your baby is born. WHAT TO EXPECT AT YOUR PRENATAL VISITS During a routine prenatal visit:  You will be weighed to make sure you and the baby are growing normally.  Your blood pressure will be taken.  Your abdomen will be measured to track your baby's growth.  The fetal heartbeat will be listened to starting around week 10 or 12 of your pregnancy.  Test results from any previous visits will be discussed. Your health care provider may ask you:  How you are feeling.  If you are feeling the baby move.  If you have had any abnormal symptoms, such as leaking fluid, bleeding, severe headaches, or abdominal cramping.  If you have any questions. Other tests   that may be performed during your first trimester include:  Blood tests to find your blood type and to check for the presence of any previous infections. They will also be used to check for low iron levels (anemia) and Rh antibodies. Later in the pregnancy, blood tests for diabetes will be done along with other tests if problems develop.  Urine tests to check for infections, diabetes, or protein in the urine.  An ultrasound to confirm the proper growth and development of the baby.  An amniocentesis to check for possible genetic problems.  Fetal screens for  spina bifida and Down syndrome.  You may need other tests to make sure you and the baby are doing well. HOME CARE INSTRUCTIONS  Medicines  Follow your health care provider's instructions regarding medicine use. Specific medicines may be either safe or unsafe to take during pregnancy.  Take your prenatal vitamins as directed.  If you develop constipation, try taking a stool softener if your health care provider approves. Diet  Eat regular, well-balanced meals. Choose a variety of foods, such as meat or vegetable-based protein, fish, milk and low-fat dairy products, vegetables, fruits, and whole grain breads and cereals. Your health care provider will help you determine the amount of weight gain that is right for you.  Avoid raw meat and uncooked cheese. These carry germs that can cause birth defects in the baby.  Eating four or five small meals rather than three large meals a day may help relieve nausea and vomiting. If you start to feel nauseous, eating a few soda crackers can be helpful. Drinking liquids between meals instead of during meals also seems to help nausea and vomiting.  If you develop constipation, eat more high-fiber foods, such as fresh vegetables or fruit and whole grains. Drink enough fluids to keep your urine clear or pale yellow. Activity and Exercise  Exercise only as directed by your health care provider. Exercising will help you:  Control your weight.  Stay in shape.  Be prepared for labor and delivery.  Experiencing pain or cramping in the lower abdomen or low back is a good sign that you should stop exercising. Check with your health care provider before continuing normal exercises.  Try to avoid standing for long periods of time. Move your legs often if you must stand in one place for a long time.  Avoid heavy lifting.  Wear low-heeled shoes, and practice good posture.  You may continue to have sex unless your health care provider directs you  otherwise. Relief of Pain or Discomfort  Wear a good support bra for breast tenderness.   Take warm sitz baths to soothe any pain or discomfort caused by hemorrhoids. Use hemorrhoid cream if your health care provider approves.   Rest with your legs elevated if you have leg cramps or low back pain.  If you develop varicose veins in your legs, wear support hose. Elevate your feet for 15 minutes, 3-4 times a day. Limit salt in your diet. Prenatal Care  Schedule your prenatal visits by the twelfth week of pregnancy. They are usually scheduled monthly at first, then more often in the last 2 months before delivery.  Write down your questions. Take them to your prenatal visits.  Keep all your prenatal visits as directed by your health care provider. Safety  Wear your seat belt at all times when driving.  Make a list of emergency phone numbers, including numbers for family, friends, the hospital, and police and fire departments. General Tips    Ask your health care provider for a referral to a local prenatal education class. Begin classes no later than at the beginning of month 6 of your pregnancy.  Ask for help if you have counseling or nutritional needs during pregnancy. Your health care provider can offer advice or refer you to specialists for help with various needs.  Do not use hot tubs, steam rooms, or saunas.  Do not douche or use tampons or scented sanitary pads.  Do not cross your legs for long periods of time.  Avoid cat litter boxes and soil used by cats. These carry germs that can cause birth defects in the baby and possibly loss of the fetus by miscarriage or stillbirth.  Avoid all smoking, herbs, alcohol, and medicines not prescribed by your health care provider. Chemicals in these affect the formation and growth of the baby.  Schedule a dentist appointment. At home, brush your teeth with a soft toothbrush and be gentle when you floss. SEEK MEDICAL CARE IF:   You have  dizziness.  You have mild pelvic cramps, pelvic pressure, or nagging pain in the abdominal area.  You have persistent nausea, vomiting, or diarrhea.  You have a bad smelling vaginal discharge.  You have pain with urination.  You notice increased swelling in your face, hands, legs, or ankles. SEEK IMMEDIATE MEDICAL CARE IF:   You have a fever.  You are leaking fluid from your vagina.  You have spotting or bleeding from your vagina.  You have severe abdominal cramping or pain.  You have rapid weight gain or loss.  You vomit blood or material that looks like coffee grounds.  You are exposed to German measles and have never had them.  You are exposed to fifth disease or chickenpox.  You develop a severe headache.  You have shortness of breath.  You have any kind of trauma, such as from a fall or a car accident. Document Released: 01/15/2001 Document Revised: 06/07/2013 Document Reviewed: 12/01/2012 Exit[employer] Patient Information 2015 [employer]. This information is not intended to replace advice given to you by your health care provider. Make sure you discuss any questions you have with your health care provider.  

## 2014-07-19 ENCOUNTER — Encounter: Payer: Self-pay | Admitting: Obstetrics & Gynecology

## 2014-07-19 LAB — PRENATAL PROFILE (SOLSTAS)
Antibody Screen: NEGATIVE
BASOS ABS: 0 10*3/uL (ref 0.0–0.1)
Basophils Relative: 0 % (ref 0–1)
Eosinophils Absolute: 0.1 10*3/uL (ref 0.0–0.7)
Eosinophils Relative: 1 % (ref 0–5)
HCT: 34.2 % — ABNORMAL LOW (ref 36.0–46.0)
HIV: NONREACTIVE
Hemoglobin: 11.4 g/dL — ABNORMAL LOW (ref 12.0–15.0)
Hepatitis B Surface Ag: NEGATIVE
Lymphocytes Relative: 34 % (ref 12–46)
Lymphs Abs: 2.3 10*3/uL (ref 0.7–4.0)
MCH: 29.9 pg (ref 26.0–34.0)
MCHC: 33.3 g/dL (ref 30.0–36.0)
MCV: 89.8 fL (ref 78.0–100.0)
MPV: 10.8 fL (ref 8.6–12.4)
Monocytes Absolute: 0.4 10*3/uL (ref 0.1–1.0)
Monocytes Relative: 6 % (ref 3–12)
Neutro Abs: 4 10*3/uL (ref 1.7–7.7)
Neutrophils Relative %: 59 % (ref 43–77)
PLATELETS: 256 10*3/uL (ref 150–400)
RBC: 3.81 MIL/uL — ABNORMAL LOW (ref 3.87–5.11)
RDW: 13.9 % (ref 11.5–15.5)
RH TYPE: POSITIVE
RUBELLA: 4.47 {index} — AB (ref <0.90)
WBC: 6.8 10*3/uL (ref 4.0–10.5)

## 2014-07-19 LAB — ALCOHOL METABOLITE (ETG), URINE: ETGU: NEGATIVE ng/mL

## 2014-07-20 LAB — HEMOGLOBINOPATHY EVALUATION
HEMOGLOBIN OTHER: 0 %
HGB A2 QUANT: 2.5 % (ref 2.2–3.2)
HGB F QUANT: 0 % (ref 0.0–2.0)
Hgb A: 97.5 % (ref 96.8–97.8)
Hgb S Quant: 0 %

## 2014-07-20 LAB — CULTURE, OB URINE
Colony Count: NO GROWTH
Organism ID, Bacteria: NO GROWTH

## 2014-07-21 LAB — COCAINE METABOLITE (GC/LC/MS), URINE: Benzoylecgonine GC/MS Conf: 10113 ng/mL — AB (ref <100)

## 2014-07-21 LAB — CANNABANOIDS (GC/LC/MS), URINE: THC-COOH UR CONFIRM: 74 ng/mL — AB (ref <5)

## 2014-07-21 LAB — CYTOLOGY - PAP

## 2014-07-22 ENCOUNTER — Encounter: Payer: Self-pay | Admitting: Obstetrics & Gynecology

## 2014-07-22 DIAGNOSIS — F1290 Cannabis use, unspecified, uncomplicated: Secondary | ICD-10-CM | POA: Insufficient documentation

## 2014-07-22 LAB — PRESCRIPTION MONITORING PROFILE (19 PANEL)
AMPHETAMINE/METH: NEGATIVE ng/mL
BENZODIAZEPINE SCREEN, URINE: NEGATIVE ng/mL
Barbiturate Screen, Urine: NEGATIVE ng/mL
Buprenorphine, Urine: NEGATIVE ng/mL
CARISOPRODOL, URINE: NEGATIVE ng/mL
Creatinine, Urine: 166.54 mg/dL (ref >20.0)
Fentanyl, Ur: NEGATIVE ng/mL
MDMA URINE: NEGATIVE ng/mL
MEPERIDINE UR: NEGATIVE ng/mL
METHAQUALONE SCREEN (URINE): NEGATIVE ng/mL
Methadone Screen, Urine: NEGATIVE ng/mL
NITRITES URINE, INITIAL: NEGATIVE ug/mL
OXYCODONE SCRN UR: NEGATIVE ng/mL
Opiate Screen, Urine: NEGATIVE ng/mL
Phencyclidine, Ur: NEGATIVE ng/mL
Propoxyphene: NEGATIVE ng/mL
Tapentadol, urine: NEGATIVE ng/mL
Tramadol Scrn, Ur: NEGATIVE ng/mL
Zolpidem, Urine: NEGATIVE ng/mL
pH, Initial: 6.4 pH (ref 4.5–8.9)

## 2014-08-03 ENCOUNTER — Other Ambulatory Visit (HOSPITAL_COMMUNITY): Payer: Self-pay

## 2014-08-03 ENCOUNTER — Ambulatory Visit (HOSPITAL_COMMUNITY): Admission: RE | Admit: 2014-08-03 | Payer: Medicaid Other | Source: Ambulatory Visit

## 2014-08-15 ENCOUNTER — Encounter: Payer: Self-pay | Admitting: Family Medicine

## 2014-08-15 NOTE — Progress Notes (Signed)
This encounter was created in error - please disregard.

## 2014-08-17 ENCOUNTER — Encounter: Payer: Self-pay | Admitting: *Deleted

## 2014-08-17 ENCOUNTER — Ambulatory Visit (INDEPENDENT_AMBULATORY_CARE_PROVIDER_SITE_OTHER): Payer: Medicaid Other | Admitting: Certified Nurse Midwife

## 2014-08-17 VITALS — BP 123/70 | Wt 172.0 lb

## 2014-08-17 DIAGNOSIS — Z3482 Encounter for supervision of other normal pregnancy, second trimester: Secondary | ICD-10-CM | POA: Diagnosis not present

## 2014-08-17 DIAGNOSIS — O99324 Drug use complicating childbirth: Secondary | ICD-10-CM | POA: Diagnosis not present

## 2014-08-17 DIAGNOSIS — O0991 Supervision of high risk pregnancy, unspecified, first trimester: Secondary | ICD-10-CM

## 2014-08-17 DIAGNOSIS — F1420 Cocaine dependence, uncomplicated: Secondary | ICD-10-CM

## 2014-08-17 DIAGNOSIS — O26872 Cervical shortening, second trimester: Secondary | ICD-10-CM | POA: Diagnosis not present

## 2014-08-17 DIAGNOSIS — O0992 Supervision of high risk pregnancy, unspecified, second trimester: Secondary | ICD-10-CM

## 2014-08-17 DIAGNOSIS — F1210 Cannabis abuse, uncomplicated: Secondary | ICD-10-CM

## 2014-08-17 MED ORDER — PROMETHAZINE HCL 25 MG PO TABS: 25.0000 mg | ORAL_TABLET | Freq: Four times a day (QID) | ORAL | Status: DC | PRN

## 2014-08-17 NOTE — Progress Notes (Signed)
Subjective:  Nicole Mahoney is a 34 y.o. [phone] at 4521w2d being seen today for ongoing prenatal care.  Patient reports constipation and nausea.  Contractions: Not present.  Vag. Bleeding: None. Movement: Absent. Denies leaking of fluid.   The following portions of the patient's history were reviewed and updated as appropriate: allergies, current medications, past family history, past medical history, past social history, past surgical history and problem list. Pt states she is smoking marijuana but has not done cocaine.  Objective:   Filed Vitals:   08/17/14 1336  BP: 123/70  Weight: 172 lb (78.019 kg)    Fetal Status: Fetal Heart Rate (bpm): 152   Movement: Absent     General:  Alert, oriented and cooperative. Patient is in no acute distress.  Skin: Skin is warm and dry. No rash noted.   Cardiovascular: Normal heart rate noted  Respiratory: Normal respiratory effort, no problems with respiration noted  Abdomen: Soft, gravid, appropriate for gestational age. Pain/Pressure: Absent     Vaginal: Vag. Bleeding: None.    Vag D/C Character: Thin  Cervix: Not evaluated        Extremities: Normal range of motion.  Edema: None  Mental Status: Normal mood and affect. Normal behavior. Normal judgment and thought content.   Urinalysis:     Neg NEG Assessment and Plan:  Pregnancy: G4P3003 at 8421w2d  1. Supervision of high-risk pregnancy, first trimester UDS - Prescript Monitor Profile(19) Advised Miralax   Preterm labor symptoms and general obstetric precautions including but not limited to vaginal bleeding, contractions, leaking of fluid and fetal movement were reviewed in detail with the patient.  Please refer to After Visit Summary for other counseling recommendations.   Return in about 4 weeks (around 09/14/2014).   Rhea PinkLori A Clemmons, CNM

## 2014-08-17 NOTE — Patient Instructions (Signed)
Second Trimester of Pregnancy The second trimester is from week 13 through week 28, months 4 through 6. The second trimester is often a time when you feel your best. Your body has also adjusted to being pregnant, and you begin to feel better physically. Usually, morning sickness has lessened or quit completely, you may have more energy, and you may have an increase in appetite. The second trimester is also a time when the fetus is growing rapidly. At the end of the sixth month, the fetus is about 9 inches long and weighs about 1 pounds. You will likely begin to feel the baby move (quickening) between 18 and 20 weeks of the pregnancy. BODY CHANGES Your body goes through many changes during pregnancy. The changes vary from woman to woman.   Your weight will continue to increase. You will notice your lower abdomen bulging out.  You may begin to get stretch marks on your hips, abdomen, and breasts.  You may develop headaches that can be relieved by medicines approved by your health care provider.  You may urinate more often because the fetus is pressing on your bladder.  You may develop or continue to have heartburn as a result of your pregnancy.  You may develop constipation because certain hormones are causing the muscles that push waste through your intestines to slow down.  You may develop hemorrhoids or swollen, bulging veins (varicose veins).  You may have back pain because of the weight gain and pregnancy hormones relaxing your joints between the bones in your pelvis and as a result of a shift in weight and the muscles that support your balance.  Your breasts will continue to grow and be tender.  Your gums may bleed and may be sensitive to brushing and flossing.  Dark spots or blotches (chloasma, mask of pregnancy) may develop on your face. This will likely fade after the baby is born.  A dark line from your belly button to the pubic area (linea nigra) may appear. This will likely fade  after the baby is born.  You may have changes in your hair. These can include thickening of your hair, rapid growth, and changes in texture. Some women also have hair loss during or after pregnancy, or hair that feels dry or thin. Your hair will most likely return to normal after your baby is born. WHAT TO EXPECT AT YOUR PRENATAL VISITS During a routine prenatal visit:  You will be weighed to make sure you and the fetus are growing normally.  Your blood pressure will be taken.  Your abdomen will be measured to track your baby's growth.  The fetal heartbeat will be listened to.  Any test results from the previous visit will be discussed. Your health care provider may ask you:  How you are feeling.  If you are feeling the baby move.  If you have had any abnormal symptoms, such as leaking fluid, bleeding, severe headaches, or abdominal cramping.  If you have any questions. Other tests that may be performed during your second trimester include:  Blood tests that check for:  Low iron levels (anemia).  Gestational diabetes (between 24 and 28 weeks).  Rh antibodies.  Urine tests to check for infections, diabetes, or protein in the urine.  An ultrasound to confirm the proper growth and development of the baby.  An amniocentesis to check for possible genetic problems.  Fetal screens for spina bifida and Down syndrome. HOME CARE INSTRUCTIONS   Avoid all smoking, herbs, alcohol, and unprescribed   drugs. These chemicals affect the formation and growth of the baby.  Follow your health care provider's instructions regarding medicine use. There are medicines that are either safe or unsafe to take during pregnancy.  Exercise only as directed by your health care provider. Experiencing uterine cramps is a good sign to stop exercising.  Continue to eat regular, healthy meals.  Wear a good support bra for breast tenderness.  Do not use hot tubs, steam rooms, or saunas.  Wear your  seat belt at all times when driving.  Avoid raw meat, uncooked cheese, cat litter boxes, and soil used by cats. These carry germs that can cause birth defects in the baby.  Take your prenatal vitamins.  Try taking a stool softener (if your health care provider approves) if you develop constipation. Eat more high-fiber foods, such as fresh vegetables or fruit and whole grains. Drink plenty of fluids to keep your urine clear or pale yellow.  Take warm sitz baths to soothe any pain or discomfort caused by hemorrhoids. Use hemorrhoid cream if your health care provider approves.  If you develop varicose veins, wear support hose. Elevate your feet for 15 minutes, 3-4 times a day. Limit salt in your diet.  Avoid heavy lifting, wear low heel shoes, and practice good posture.  Rest with your legs elevated if you have leg cramps or low back pain.  Visit your dentist if you have not gone yet during your pregnancy. Use a soft toothbrush to brush your teeth and be gentle when you floss.  A sexual relationship may be continued unless your health care provider directs you otherwise.  Continue to go to all your prenatal visits as directed by your health care provider. SEEK MEDICAL CARE IF:   You have dizziness.  You have mild pelvic cramps, pelvic pressure, or nagging pain in the abdominal area.  You have persistent nausea, vomiting, or diarrhea.  You have a bad smelling vaginal discharge.  You have pain with urination. SEEK IMMEDIATE MEDICAL CARE IF:   You have a fever.  You are leaking fluid from your vagina.  You have spotting or bleeding from your vagina.  You have severe abdominal cramping or pain.  You have rapid weight gain or loss.  You have shortness of breath with chest pain.  You notice sudden or extreme swelling of your face, hands, ankles, feet, or legs.  You have not felt your baby move in over an hour.  You have severe headaches that do not go away with  medicine.  You have vision changes. Document Released: 01/15/2001 Document Revised: 01/26/2013 Document Reviewed: 03/24/2012 [employer] Patient Information 2015 [employer]. This information is not intended to replace advice given to you by your health care provider. Make sure you discuss any questions you have with your health care provider.  

## 2014-08-20 LAB — CANNABANOIDS (GC/LC/MS), URINE: THC-COOH (GC/LC/MS), ur confirm: 27 ng/mL — AB (ref <5)

## 2014-08-20 LAB — COCAINE METABOLITE (GC/LC/MS), URINE: Benzoylecgonine GC/MS Conf: 880 ng/mL — AB (ref <100)

## 2014-08-23 LAB — PRESCRIPTION MONITORING PROFILE (19 PANEL)
Amphetamine/Meth: NEGATIVE ng/mL
Barbiturate Screen, Urine: NEGATIVE ng/mL
Benzodiazepine Screen, Urine: NEGATIVE ng/mL
Buprenorphine, Urine: NEGATIVE ng/mL
Carisoprodol, Urine: NEGATIVE ng/mL
Creatinine, Urine: 138.14 mg/dL (ref >20.0)
Fentanyl, Ur: NEGATIVE ng/mL
MDMA URINE: NEGATIVE ng/mL
Meperidine, Ur: NEGATIVE ng/mL
Methadone Screen, Urine: NEGATIVE ng/mL
Methaqualone: NEGATIVE ng/mL
Nitrites, Initial: NEGATIVE ug/mL
Opiate Screen, Urine: NEGATIVE ng/mL
Oxycodone Screen, Ur: NEGATIVE ng/mL
Phencyclidine, Ur: NEGATIVE ng/mL
Propoxyphene: NEGATIVE ng/mL
Tapentadol, urine: NEGATIVE ng/mL
Tramadol Scrn, Ur: NEGATIVE ng/mL
Zolpidem, Urine: NEGATIVE ng/mL
pH, Initial: 5.4 pH (ref 4.5–8.9)

## 2014-09-06 ENCOUNTER — Encounter (HOSPITAL_COMMUNITY): Payer: Self-pay | Admitting: Emergency Medicine

## 2014-09-06 ENCOUNTER — Emergency Department (HOSPITAL_COMMUNITY)
Admission: EM | Admit: 2014-09-06 | Discharge: 2014-09-07 | Disposition: A | Discharge status: Home / Self Care | Payer: Medicaid Other | Attending: Emergency Medicine | Admitting: Emergency Medicine

## 2014-09-06 DIAGNOSIS — Z3A14 14 weeks gestation of pregnancy: Secondary | ICD-10-CM | POA: Insufficient documentation

## 2014-09-06 DIAGNOSIS — Z3492 Encounter for supervision of normal pregnancy, unspecified, second trimester: Secondary | ICD-10-CM

## 2014-09-06 DIAGNOSIS — Z79899 Other long term (current) drug therapy: Secondary | ICD-10-CM | POA: Diagnosis not present

## 2014-09-06 DIAGNOSIS — O162 Unspecified maternal hypertension, second trimester: Secondary | ICD-10-CM | POA: Diagnosis not present

## 2014-09-06 DIAGNOSIS — F1410 Cocaine abuse, uncomplicated: Secondary | ICD-10-CM | POA: Insufficient documentation

## 2014-09-06 DIAGNOSIS — Z880 Allergy status to penicillin: Secondary | ICD-10-CM | POA: Diagnosis not present

## 2014-09-06 DIAGNOSIS — Y9389 Activity, other specified: Secondary | ICD-10-CM | POA: Insufficient documentation

## 2014-09-06 DIAGNOSIS — W1809XA Striking against other object with subsequent fall, initial encounter: Secondary | ICD-10-CM

## 2014-09-06 DIAGNOSIS — Y998 Other external cause status: Secondary | ICD-10-CM | POA: Diagnosis not present

## 2014-09-06 DIAGNOSIS — O9A212 Injury, poisoning and certain other consequences of external causes complicating pregnancy, second trimester: Secondary | ICD-10-CM | POA: Insufficient documentation

## 2014-09-06 DIAGNOSIS — O99322 Drug use complicating pregnancy, second trimester: Secondary | ICD-10-CM | POA: Insufficient documentation

## 2014-09-06 DIAGNOSIS — O99512 Diseases of the respiratory system complicating pregnancy, second trimester: Secondary | ICD-10-CM | POA: Insufficient documentation

## 2014-09-06 DIAGNOSIS — F17210 Nicotine dependence, cigarettes, uncomplicated: Secondary | ICD-10-CM | POA: Insufficient documentation

## 2014-09-06 DIAGNOSIS — S3991XA Unspecified injury of abdomen, initial encounter: Secondary | ICD-10-CM | POA: Diagnosis not present

## 2014-09-06 DIAGNOSIS — F1910 Other psychoactive substance abuse, uncomplicated: Secondary | ICD-10-CM

## 2014-09-06 DIAGNOSIS — O99332 Smoking (tobacco) complicating pregnancy, second trimester: Secondary | ICD-10-CM | POA: Diagnosis not present

## 2014-09-06 DIAGNOSIS — Y9289 Other specified places as the place of occurrence of the external cause: Secondary | ICD-10-CM | POA: Diagnosis not present

## 2014-09-06 DIAGNOSIS — W1839XA Other fall on same level, initial encounter: Secondary | ICD-10-CM | POA: Diagnosis not present

## 2014-09-06 DIAGNOSIS — F1210 Cannabis abuse, uncomplicated: Secondary | ICD-10-CM | POA: Diagnosis not present

## 2014-09-06 DIAGNOSIS — J45909 Unspecified asthma, uncomplicated: Secondary | ICD-10-CM | POA: Insufficient documentation

## 2014-09-06 LAB — CBC
HCT: 27.9 % — ABNORMAL LOW (ref 36.0–46.0)
HEMOGLOBIN: 9.6 g/dL — AB (ref 12.0–15.0)
MCH: 31.4 pg (ref 26.0–34.0)
MCHC: 34.4 g/dL (ref 30.0–36.0)
MCV: 91.2 fL (ref 78.0–100.0)
Platelets: 191 10*3/uL (ref 150–400)
RBC: 3.06 MIL/uL — AB (ref 3.87–5.11)
RDW: 13.2 % (ref 11.5–15.5)
WBC: 8.6 10*3/uL (ref 4.0–10.5)

## 2014-09-06 MED ORDER — ACETAMINOPHEN 325 MG PO TABS: 325.0000 mg | ORAL_TABLET | Freq: Once | ORAL | Status: DC

## 2014-09-06 NOTE — ED Notes (Addendum)
Pt. tripped and fell at home this evening , reports intermittent  pain at mid abdomen and right ear pain , no LOC / ambulatory , denies vaginal discharge or spotting . Pt. is 5 months pregnant .

## 2014-09-06 NOTE — ED Provider Notes (Signed)
CSN: [id number]     Arrival date & time 09/06/14  2253 History  This chart was scribed for Marisa Severin, MD by Lyndel Safe, ED Scribe. This patient was seen in room [facility] and the patient's care was started 11:40 PM.   Chief Complaint  Patient presents with  . Abdominal Pain   The history is provided by the patient. The history is limited by the condition of the patient. No language interpreter was used.   LEVEL 5 CAVEAT: OTHER  HPI Comments: Nicole Mahoney is a 34 y.o. female, with a PMhx of HTN and drug abuse (marijuana and cocaine),who presents to the Emergency Department complaining of sudden onset, intermittent, moderate generalized abdominal pain s/p fall that occurred this evening. Additionally notes light pink vaginal bleeding 1 day ago that has resolved. States this vaginal bleeding resolved before she fell this evening. Pt reports she is followed by [facility]; last [place] was 21 days ago when pt was found to be 14 weeks and 3 days. Pt is currently 17 weeks. She is due for her next OB appointment in 7 days. Denies any complication with the pregnancy thus far.  Denies noting any bleeding s/p fall or any other arthralgias or myalgias. G4;P3   Past Medical History  Diagnosis Date  . Bronchitis, acute   . Asthma   . Hypertension   . Drug abuse, cocaine type   . Drug abuse, marijuana    Past Surgical History  Procedure Laterality Date  . Hand surgery    . Appendectomy     Family History  Problem Relation Age of Onset  . Cancer Other   . Asthma Mother   . Asthma Father   . Depression Father   . Depression Sister   . Depression Brother   . Diabetes Paternal Grandmother    History  Substance Use Topics  . Smoking status: Current Every Day Smoker -- 0.25 packs/day    Types: Cigarettes  . Smokeless tobacco: Never Used  . Alcohol Use: No     Comment: "occasional drinks"   OB History    Gravida Para Term Preterm AB TAB SAB Ectopic Multiple Living   0 3     Review of Systems  Unable to perform ROS: Other  Gastrointestinal: Positive for abdominal pain.   Allergies  Penicillins and Percocet  Home Medications   Prior to Admission medications   Medication Sig Start Date End Date Taking? Authorizing Provider  famotidine (PEPCID) 20 MG tablet Take 1 tablet (20 mg total) by mouth 2 (two) times daily. 03/15/14   Thermon Leyland, NP  Prenatal Multivit-Min-Fe-FA (PRENATAL VITAMINS) 0.8 MG tablet Take 1 tablet by mouth daily. 07/18/14   Tereso Newcomer, MD  promethazine (PHENERGAN) 25 MG tablet Take 1 tablet (25 mg total) by mouth every 6 (six) hours as needed for nausea or vomiting. 08/17/14   Elmore Guise Clemmons, CNM  sertraline (ZOLOFT) 50 MG tablet Take 1 tablet (50 mg total) by mouth daily. 07/18/14   Tereso Newcomer, MD   BP 99/66 mmHg  Pulse 67  Temp(Src) 98.7 F (37.1 C) (Oral)  Resp 20  SpO2 100%  LMP 05/09/2014 Physical Exam  Constitutional: She appears well-developed and well-nourished. No distress.  Patient appears intoxicated, frequently rolling her eyes up in her head, slow to respond to questions  HENT:  Head: Normocephalic and atraumatic.  Nose: Nose normal.  Mouth/Throat: Oropharynx is clear and moist.  Eyes: Conjunctivae and EOM are normal. Pupils are equal, round, and reactive to light. Right eye exhibits no discharge. Left eye exhibits no discharge. No scleral icterus.  Neck: Normal range of motion. Neck supple. No JVD present. No tracheal deviation present. No thyromegaly present.  Cardiovascular: Normal rate, regular rhythm, normal heart sounds and intact distal pulses.  Exam reveals no gallop and no friction rub.   No murmur heard. Pulmonary/Chest: Effort normal and breath sounds normal. No stridor. No respiratory distress. She has no wheezes. She has no rales. She exhibits no tenderness.  Abdominal: Soft. Bowel sounds are normal. She exhibits mass (gravid uterus). She exhibits no distension. There is no tenderness.  There is no rebound and no guarding.  Musculoskeletal: Normal range of motion. She exhibits no edema or tenderness.  Lymphadenopathy:    She has no cervical adenopathy.  Neurological: She displays normal reflexes. She exhibits normal muscle tone. Coordination normal.  Skin: Skin is warm and dry. No rash noted. No erythema. No pallor.  Psychiatric:  Bizarre behavior, masturbating on arrival to the trauma room  Nursing note and vitals reviewed.  ED Course  Procedures  DIAGNOSTIC STUDIES: Oxygen Saturation is 100% on RA, normal by my interpretation.    COORDINATION OF CARE: 11:48 PM Discussed treatment plan with pt. US OB performed at bedside. Will order IV fluids and diagnostic labs. Pt acknowledges and agrees to plan.    Labs Review Labs Reviewed  LIPASE, BLOOD - Abnormal; Notable for the following:    Lipase 15 (*)    All other components within normal limits  COMPREHENSIVE METABOLIC PANEL - Abnormal; Notable for the following:    Potassium 3.0 (*)    CO2 20 (*)    BUN <5 (*)    Calcium 8.7 (*)    AST 14 (*)    ALT 12 (*)    All other components within normal limits  CBC - Abnormal; Notable for the following:    RBC 3.06 (*)    Hemoglobin 9.6 (*)    HCT 27.9 (*)    All other components within normal limits  URINALYSIS, ROUTINE W REFLEX MICROSCOPIC (NOT AT [facility]) - Abnormal; Notable for the following:    Color, Urine AMBER (*)    Specific Gravity, Urine 1.036 (*)    Bilirubin Urine SMALL (*)    Ketones, ur 40 (*)    Protein, ur 100 (*)    All other components within normal limits  URINE RAPID DRUG SCREEN, HOSP PERFORMED - Abnormal; Notable for the following:    Cocaine POSITIVE (*)    Tetrahydrocannabinol POSITIVE (*)    All other components within normal limits  URINE MICROSCOPIC-ADD ON - Abnormal; Notable for the following:    Squamous Epithelial / LPF FEW (*)    Casts GRANULAR CAST (*)    All other components within normal limits  I-STAT CG4 LACTIC ACID, ED -  Abnormal; Notable for the following:    Lactic Acid, Venous 0.33 (*)    All other components within normal limits  URINALYSIS, DIPSTICK ONLY    Imaging Review No results found.   EKG Interpretation None      MDM   Final diagnoses:  Polysubstance abuse  Fall against object, initial encounter  Second trimester pregnancy    34 year old G4, P3 at 17 weeks and 1 day.  Presents with abdominal cramping after reported fall.  Patient appears to be under an illicit substance, prior records reviewed, concern for cocaine intoxication.  Fetus is pre-viable,  fetal heart tones normal, good activity.  On bedside ultrasound.  Land to allow patient to sober and discharged home.  I personally performed the services described in this documentation, which was scribed in my presence. The recorded information has been reviewed and is accurate.     Marisa Severin, MD 09/07/14 [phone]

## 2014-09-07 LAB — COMPREHENSIVE METABOLIC PANEL
ALBUMIN: 3.5 g/dL (ref 3.5–5.0)
ALT: 12 U/L — AB (ref 14–54)
ANION GAP: 11 (ref 5–15)
AST: 14 U/L — AB (ref 15–41)
Alkaline Phosphatase: 43 U/L (ref 38–126)
BILIRUBIN TOTAL: 0.5 mg/dL (ref 0.3–1.2)
BUN: <5 mg/dL — ABNORMAL LOW (ref 6–20)
CHLORIDE: 105 mmol/L (ref 101–111)
CO2: 20 mmol/L — AB (ref 22–32)
CREATININE: 0.69 mg/dL (ref 0.44–1.00)
Calcium: 8.7 mg/dL — ABNORMAL LOW (ref 8.9–10.3)
GFR calc non Af Amer: >60 mL/min (ref >60)
Glucose, Bld: 86 mg/dL (ref 65–99)
Potassium: 3 mmol/L — ABNORMAL LOW (ref 3.5–5.1)
Sodium: 136 mmol/L (ref 135–145)
Total Protein: 6.6 g/dL (ref 6.5–8.1)

## 2014-09-07 LAB — URINALYSIS, ROUTINE W REFLEX MICROSCOPIC
Glucose, UA: NEGATIVE mg/dL
Hgb urine dipstick: NEGATIVE
KETONES UR: 40 mg/dL — AB
Leukocytes, UA: NEGATIVE
Nitrite: NEGATIVE
PROTEIN: 100 mg/dL — AB
SPECIFIC GRAVITY, URINE: 1.036 — AB (ref 1.005–1.030)
Urobilinogen, UA: 1 mg/dL (ref 0.0–1.0)
pH: 5.5 (ref 5.0–8.0)

## 2014-09-07 LAB — LIPASE, BLOOD: Lipase: 15 U/L — ABNORMAL LOW (ref 22–51)

## 2014-09-07 LAB — URINE MICROSCOPIC-ADD ON

## 2014-09-07 LAB — RAPID URINE DRUG SCREEN, HOSP PERFORMED
AMPHETAMINES: NOT DETECTED
BARBITURATES: NOT DETECTED
BENZODIAZEPINES: NOT DETECTED
COCAINE: POSITIVE — AB
OPIATES: NOT DETECTED
TETRAHYDROCANNABINOL: POSITIVE — AB

## 2014-09-07 LAB — I-STAT CG4 LACTIC ACID, ED: Lactic Acid, Venous: 0.33 mmol/L — ABNORMAL LOW (ref 0.5–2.0)

## 2014-09-07 MED ORDER — SODIUM CHLORIDE 0.9 % IV BOLUS (SEPSIS)
1000.0000 mL | Freq: Once | INTRAVENOUS | Status: AC
  Administered 2014-09-07: 1000 mL via INTRAVENOUS

## 2014-09-07 MED ORDER — ONDANSETRON HCL 4 MG/2ML IJ SOLN
4.0000 mg | Freq: Once | INTRAMUSCULAR | Status: AC
  Administered 2014-09-07: 4 mg via INTRAVENOUS
  Filled 2014-09-07: qty 2

## 2014-09-07 NOTE — ED Notes (Signed)
Pt arrives with $15 cash, cell phone, hair clip, clothes and flip flops.

## 2014-09-07 NOTE — Discharge Instructions (Signed)
Using cocaine can cause preterm labor, which will cause you to lose your baby.  Follow-up with your OB clinic this week.   Fall Prevention and Home Safety Falls cause injuries and can affect all age groups. It is possible to use preventive measures to significantly decrease the likelihood of falls. There are many simple measures which can make your home safer and prevent falls. OUTDOORS  Repair cracks and edges of walkways and driveways.  Remove high doorway thresholds.  Trim shrubbery on the main path into your home.  Have good outside lighting.  Clear walkways of tools, rocks, debris, and clutter.  Check that handrails are not broken and are securely fastened. Both sides of steps should have handrails.  Have leaves, snow, and ice cleared regularly.  Use sand or salt on walkways during winter months.  In the garage, clean up grease or oil spills. BATHROOM  Install night lights.  Install grab bars by the toilet and in the tub and shower.  Use non-skid mats or decals in the tub or shower.  Place a plastic non-slip stool in the shower to sit on, if needed.  Keep floors dry and clean up all water on the floor immediately.  Remove soap buildup in the tub or shower on a regular basis.  Secure bath mats with non-slip, double-sided rug tape.  Remove throw rugs and tripping hazards from the floors. BEDROOMS  Install night lights.  Make sure a bedside light is easy to reach.  Do not use oversized bedding.  Keep a telephone by your bedside.  Have a firm chair with side arms to use for getting dressed.  Remove throw rugs and tripping hazards from the floor. KITCHEN  Keep handles on pots and pans turned toward the center of the stove. Use back burners when possible.  Clean up spills quickly and allow time for drying.  Avoid walking on wet floors.  Avoid hot utensils and knives.  Position shelves so they are not too high or low.  Place commonly used objects within  easy reach.  If necessary, use a sturdy step stool with a grab bar when reaching.  Keep electrical cables out of the way.  Do not use floor polish or wax that makes floors slippery. If you must use wax, use non-skid floor wax.  Remove throw rugs and tripping hazards from the floor. STAIRWAYS  Never leave objects on stairs.  Place handrails on both sides of stairways and use them. Fix any loose handrails. Make sure handrails on both sides of the stairways are as long as the stairs.  Check carpeting to make sure it is firmly attached along stairs. Make repairs to worn or loose carpet promptly.  Avoid placing throw rugs at the top or bottom of stairways, or properly secure the rug with carpet tape to prevent slippage. Get rid of throw rugs, if possible.  Have an electrician put in a light switch at the top and bottom of the stairs. OTHER FALL PREVENTION TIPS  Wear low-heel or rubber-soled shoes that are supportive and fit well. Wear closed toe shoes.  When using a stepladder, make sure it is fully opened and both spreaders are firmly locked. Do not climb a closed stepladder.  Add color or contrast paint or tape to grab bars and handrails in your home. Place contrasting color strips on first and last steps.  Learn and use mobility aids as needed. Install an electrical emergency response system.  Turn on lights to avoid dark areas. Replace  light bulbs that burn out immediately. Get light switches that glow.  Arrange furniture to create clear pathways. Keep furniture in the same place.  Firmly attach carpet with non-skid or double-sided tape.  Eliminate uneven floor surfaces.  Select a carpet pattern that does not visually hide the edge of steps.  Be aware of all pets. OTHER HOME SAFETY TIPS  Set the water temperature for 120 F (48.8 C).  Keep emergency numbers on or near the telephone.  Keep smoke detectors on every level of the home and near sleeping areas. Document  Released: 01/11/2002 Document Revised: 07/23/2011 Document Reviewed: 04/12/2011 [facility] Patient Information 2015 [place], [place]. This information is not intended to replace advice given to you by your health care provider. Make sure you discuss any questions you have with your health care provider.  Chemical Dependency Chemical dependency is an addiction to drugs or alcohol. It is characterized by the repeated behavior of seeking out and using drugs and alcohol despite harmful consequences to the health and safety of ones self and others.  RISK FACTORS There are certain situations or behaviors that increase a person's risk for chemical dependency. These include:  A family history of chemical dependency.  A history of mental health issues, including depression and anxiety.  A home environment where drugs and alcohol are easily available to you.  Drug or alcohol use at a young age. SYMPTOMS  The following symptoms can indicate chemical dependency:  Inability to limit the use of drugs or alcohol.  Nausea, sweating, shakiness, and anxiety that occurs when alcohol or drugs are not being used.  An increase in amount of drugs or alcohol that is necessary to get drunk or high. People who experience these symptoms can assess their use of drugs and alcohol by asking themselves the following questions:  Have you been told by friends or family that they are worried about your use of alcohol or drugs?  Do friends and family ever tell you about things you did while drinking alcohol or using drugs that you do not remember?  Do you lie about using alcohol or drugs or about the amounts you use?  Do you have difficulty completing daily tasks unless you use alcohol or drugs?  Is the level of your work or school performance lower because of your drug or alcohol use?  Do you get sick from using drugs or alcohol but keep using anyway?  Do you feel uncomfortable in social situations unless you use  alcohol or drugs?  Do you use drugs or alcohol to help forget problems? An answer of yes to any of these questions may indicate chemical dependency. Professional evaluation is suggested. Document Released: 01/15/2001 Document Revised: 04/15/2011 Document Reviewed: 03/29/2010 [facility] Patient Information 2015 [place], [place]. This information is not intended to replace advice given to you by your health care provider. Make sure you discuss any questions you have with your health care provider.  Preterm Labor Information Preterm labor is when labor starts before you are [redacted] weeks pregnant. The normal length of pregnancy is 39 to 41 weeks.  CAUSES  The cause of preterm labor is not often known. The most common known cause is infection. RISK FACTORS  Having a history of preterm labor.  Having your water break before it should.  Having a placenta that covers the opening of the cervix.  Having a placenta that breaks away from the uterus.  Having a cervix that is too weak to hold the baby in the uterus.  Having too much fluid in the amniotic sac.  Taking drugs or smoking while pregnant.  Not gaining enough weight while pregnant.  Being younger than 5 and older than 32 years old.  Having a low income.  Being African American. SYMPTOMS  Period-like cramps, belly (abdominal) pain, or back pain.  Contractions that are regular, as often as six in an hour. They may be mild or painful.  Contractions that start at the top of the belly. They then move to the lower belly and back.  Lower belly pressure that seems to get stronger.  Bleeding from the vagina.  Fluid leaking from the vagina. TREATMENT  Treatment depends on:  Your condition.  The condition of your baby.  How many weeks pregnant you are. Your doctor may have you:  Take medicine to stop contractions.  Stay in bed except to use the restroom (bed rest).  Stay in the hospital. WHAT SHOULD YOU DO IF YOU THINK YOU  ARE IN PRETERM LABOR? Call your doctor right away. You need to go to the hospital right away.  HOW CAN YOU PREVENT PRETERM LABOR IN FUTURE PREGNANCIES?  Stop smoking, if you smoke.  Maintain healthy weight gain.  Do not take drugs or be around chemicals that are not needed.  Tell your doctor if you think you have an infection.  Tell your doctor if you had a preterm labor before. Document Released: 04/19/2008 Document Revised: 11/11/2012 Document Reviewed: 04/19/2008 [facility] Patient Information 2015 [place], [place]. This information is not intended to replace advice given to you by your health care provider. Make sure you discuss any questions you have with your health care provider.  Second Trimester of Pregnancy The second trimester is from week 13 through week 28, months 4 through 6. The second trimester is often a time when you feel your best. Your body has also adjusted to being pregnant, and you begin to feel better physically. Usually, morning sickness has lessened or quit completely, you may have more energy, and you may have an increase in appetite. The second trimester is also a time when the fetus is growing rapidly. At the end of the sixth month, the fetus is about 9 inches long and weighs about 1 pounds. You will likely begin to feel the baby move (quickening) between 18 and 20 weeks of the pregnancy. BODY CHANGES Your body goes through many changes during pregnancy. The changes vary from woman to woman.   Your weight will continue to increase. You will notice your lower abdomen bulging out.  You may begin to get stretch marks on your hips, abdomen, and breasts.  You may develop headaches that can be relieved by medicines approved by your health care provider.  You may urinate more often because the fetus is pressing on your bladder.  You may develop or continue to have heartburn as a result of your pregnancy.  You may develop constipation because certain hormones are  causing the muscles that push waste through your intestines to slow down.  You may develop hemorrhoids or swollen, bulging veins (varicose veins).  You may have back pain because of the weight gain and pregnancy hormones relaxing your joints between the bones in your pelvis and as a result of a shift in weight and the muscles that support your balance.  Your breasts will continue to grow and be tender.  Your gums may bleed and may be sensitive to brushing and flossing.  Dark spots or blotches (chloasma, mask of pregnancy) may develop  on your face. This will likely fade after the baby is born.  A dark line from your belly button to the pubic area (linea nigra) may appear. This will likely fade after the baby is born.  You may have changes in your hair. These can include thickening of your hair, rapid growth, and changes in texture. Some women also have hair loss during or after pregnancy, or hair that feels dry or thin. Your hair will most likely return to normal after your baby is born. WHAT TO EXPECT AT YOUR PRENATAL VISITS During a routine prenatal visit:  You will be weighed to make sure you and the fetus are growing normally.  Your blood pressure will be taken.  Your abdomen will be measured to track your baby's growth.  The fetal heartbeat will be listened to.  Any test results from the previous visit will be discussed. Your health care provider may ask you:  How you are feeling.  If you are feeling the baby move.  If you have had any abnormal symptoms, such as leaking fluid, bleeding, severe headaches, or abdominal cramping.  If you have any questions. Other tests that may be performed during your second trimester include:  Blood tests that check for:  Low iron levels (anemia).  Gestational diabetes (between 24 and 28 weeks).  Rh antibodies.  Urine tests to check for infections, diabetes, or protein in the urine.  An ultrasound to confirm the proper growth and  development of the baby.  An amniocentesis to check for possible genetic problems.  Fetal screens for spina bifida and Down syndrome. HOME CARE INSTRUCTIONS   Avoid all smoking, herbs, alcohol, and unprescribed drugs. These chemicals affect the formation and growth of the baby.  Follow your health care provider's instructions regarding medicine use. There are medicines that are either safe or unsafe to take during pregnancy.  Exercise only as directed by your health care provider. Experiencing uterine cramps is a good sign to stop exercising.  Continue to eat regular, healthy meals.  Wear a good support bra for breast tenderness.  Do not use hot tubs, steam rooms, or saunas.  Wear your seat belt at all times when driving.  Avoid raw meat, uncooked cheese, cat litter boxes, and soil used by cats. These carry germs that can cause birth defects in the baby.  Take your prenatal vitamins.  Try taking a stool softener (if your health care provider approves) if you develop constipation. Eat more high-fiber foods, such as fresh vegetables or fruit and whole grains. Drink plenty of fluids to keep your urine clear or pale yellow.  Take warm sitz baths to soothe any pain or discomfort caused by hemorrhoids. Use hemorrhoid cream if your health care provider approves.  If you develop varicose veins, wear support hose. Elevate your feet for 15 minutes, 3-4 times a day. Limit salt in your diet.  Avoid heavy lifting, wear low heel shoes, and practice good posture.  Rest with your legs elevated if you have leg cramps or low back pain.  Visit your dentist if you have not gone yet during your pregnancy. Use a soft toothbrush to brush your teeth and be gentle when you floss.  A sexual relationship may be continued unless your health care provider directs you otherwise.  Continue to go to all your prenatal visits as directed by your health care provider. SEEK MEDICAL CARE IF:   You have  dizziness.  You have mild pelvic cramps, pelvic pressure, or nagging pain  in the abdominal area.  You have persistent nausea, vomiting, or diarrhea.  You have a bad smelling vaginal discharge.  You have pain with urination. SEEK IMMEDIATE MEDICAL CARE IF:   You have a fever.  You are leaking fluid from your vagina.  You have spotting or bleeding from your vagina.  You have severe abdominal cramping or pain.  You have rapid weight gain or loss.  You have shortness of breath with chest pain.  You notice sudden or extreme swelling of your face, hands, ankles, feet, or legs.  You have not felt your baby move in over an hour.  You have severe headaches that do not go away with medicine.  You have vision changes. Document Released: 01/15/2001 Document Revised: 01/26/2013 Document Reviewed: 03/24/2012 [facility] Patient Information 2015 [place], [place]. This information is not intended to replace advice given to you by your health care provider. Make sure you discuss any questions you have with your health care provider.  What Do I Need to Know About Injuries During Pregnancy? Injuries can happen during pregnancy. Minor falls and accidents usually do not harm you or your baby. However, any injury should be reported to your doctor. WHAT CAN I DO TO PROTECT MYSELF FROM INJURIES?  Remove rugs and loose objects on the floor.  Wear comfortable shoes that have a good grip. Do not wear high-heeled shoes.  Always wear your seat belt. The lap belt should be below your belly. Always practice safe driving.  Do not ride on a motorcycle.  Do not participate in high-impact activities or sports.  Avoid:  Walking on wet or slippery floors.  Fires.  Starting fires.  Lifting heavy pots of boiling or hot liquids.  Fixing electrical problems.  Only take medicine as told by your doctor.  Know your blood type and the blood type of the baby's father.  Call your local emergency services  (911 in the [place] if you are a victim of domestic violence or assault. For help and support, contact the [employer]. WHEN SHOULD I GET HELP RIGHT AWAY?  You fall on your belly or have any high-impact accident or injury.  You have been a victim of domestic violence or any kind of violence.  You have been in a car accident.  You have bleeding from your vagina.  Fluid is leaking from your vagina.  You start to have belly cramping (contractions) or pain.  You feel weak or pass out (faint).  You start to throw up (vomit) after an injury.  You have been burned.  You have a stiff neck or neck pain.  You get a headache or have vision problems after an injury.  You do not feel the baby move or the baby is not moving as much as normal. Document Released: 02/23/2010 Document Revised: 06/07/2013 Document Reviewed: 10/28/2012 [facility] Patient Information 2015 [place], [place]. This information is not intended to replace advice given to you by your health care provider. Make sure you discuss any questions you have with your health care provider.

## 2014-09-07 NOTE — ED Notes (Signed)
Patient unable to ambulate. Stood up and complained of dizziness. Unable to stand on own, sat down in bed complaining of nausea and vomited.

## 2014-09-12 ENCOUNTER — Other Ambulatory Visit: Payer: Self-pay | Admitting: Obstetrics and Gynecology

## 2014-09-12 DIAGNOSIS — Z3689 Encounter for other specified antenatal screening: Secondary | ICD-10-CM

## 2014-09-12 DIAGNOSIS — Z3A18 18 weeks gestation of pregnancy: Secondary | ICD-10-CM

## 2014-09-13 ENCOUNTER — Other Ambulatory Visit: Payer: Self-pay | Admitting: Obstetrics and Gynecology

## 2014-09-13 DIAGNOSIS — Z3689 Encounter for other specified antenatal screening: Secondary | ICD-10-CM

## 2014-09-13 DIAGNOSIS — Z3A18 18 weeks gestation of pregnancy: Secondary | ICD-10-CM

## 2014-09-13 DIAGNOSIS — O99322 Drug use complicating pregnancy, second trimester: Secondary | ICD-10-CM

## 2014-09-16 ENCOUNTER — Ambulatory Visit (HOSPITAL_COMMUNITY)
Admission: RE | Admit: 2014-09-16 | Discharge: 2014-09-16 | Disposition: A | Discharge status: Home / Self Care | Payer: Medicaid Other | Source: Ambulatory Visit | Attending: Obstetrics and Gynecology | Admitting: Obstetrics and Gynecology

## 2014-09-16 ENCOUNTER — Other Ambulatory Visit: Payer: Self-pay | Admitting: Obstetrics and Gynecology

## 2014-09-16 DIAGNOSIS — Z36 Encounter for antenatal screening of mother: Secondary | ICD-10-CM | POA: Diagnosis not present

## 2014-09-16 DIAGNOSIS — Z3689 Encounter for other specified antenatal screening: Secondary | ICD-10-CM

## 2014-09-16 DIAGNOSIS — F1410 Cocaine abuse, uncomplicated: Secondary | ICD-10-CM

## 2014-09-16 DIAGNOSIS — Z3A18 18 weeks gestation of pregnancy: Secondary | ICD-10-CM | POA: Insufficient documentation

## 2014-09-16 DIAGNOSIS — O99320 Drug use complicating pregnancy, unspecified trimester: Secondary | ICD-10-CM

## 2014-09-16 DIAGNOSIS — O99322 Drug use complicating pregnancy, second trimester: Secondary | ICD-10-CM | POA: Diagnosis present

## 2014-09-16 DIAGNOSIS — Z880 Allergy status to penicillin: Secondary | ICD-10-CM

## 2014-09-17 DIAGNOSIS — Z880 Allergy status to penicillin: Secondary | ICD-10-CM | POA: Insufficient documentation

## 2014-09-20 ENCOUNTER — Ambulatory Visit (INDEPENDENT_AMBULATORY_CARE_PROVIDER_SITE_OTHER): Payer: Medicaid Other | Admitting: Obstetrics & Gynecology

## 2014-09-20 ENCOUNTER — Encounter: Payer: Self-pay | Admitting: Obstetrics & Gynecology

## 2014-09-20 VITALS — BP 104/58 | HR 81 | Wt 179.0 lb

## 2014-09-20 DIAGNOSIS — O99322 Drug use complicating pregnancy, second trimester: Secondary | ICD-10-CM | POA: Diagnosis not present

## 2014-09-20 DIAGNOSIS — F1920 Other psychoactive substance dependence, uncomplicated: Secondary | ICD-10-CM | POA: Diagnosis not present

## 2014-09-20 DIAGNOSIS — F1921 Other psychoactive substance dependence, in remission: Secondary | ICD-10-CM

## 2014-09-20 DIAGNOSIS — O0992 Supervision of high risk pregnancy, unspecified, second trimester: Secondary | ICD-10-CM

## 2014-09-20 NOTE — Progress Notes (Signed)
Subjective:  Nicole Mahoney is a 34 y.o. SAA P3 G4P3003 at [redacted]w[redacted]d being seen today for ongoing prenatal care.  Patient reports no complaints.  Contractions: Not present.  Vag. Bleeding: None. Movement: Absent. Denies leaking of fluid.   The following portions of the patient's history were reviewed and updated as appropriate: allergies, current medications, past family history, past medical history, past social history, past surgical history and problem list.   Objective:   Filed Vitals:   09/20/14 1406  BP: 104/58  Pulse: 81  Weight: 179 lb (81.194 kg)    Fetal Status: Fetal Heart Rate (bpm): 156   Movement: Absent     General:  Alert, oriented and cooperative. Patient is in no acute distress.  Skin: Skin is warm and dry. No rash noted.   Cardiovascular: Normal heart rate noted  Respiratory: Normal respiratory effort, no problems with respiration noted  Abdomen: Soft, gravid, appropriate for gestational age. Pain/Pressure: Absent     Pelvic: Vag. Bleeding: None Vag D/C Character: Thin   Cervical exam deferred        Extremities: Normal range of motion.  Edema: None  Mental Status: Normal mood and affect. Normal behavior. Normal judgment and thought content.   Urinalysis: Urine Protein: Negative Urine Glucose: Negative  Assessment and Plan:  Pregnancy: G4P3003 at [redacted]w[redacted]d  1. Supervision of high-risk pregnancy, second trimester - She declines a BTL - She declines a Dentist for finding of echogenic bowel on u/s  2. Drug dependence, antepartum, second trimester   Preterm labor symptoms and general obstetric precautions including but not limited to vaginal bleeding, contractions, leaking of fluid and fetal movement were reviewed in detail with the patient. Please refer to After Visit Summary for other counseling recommendations.  Return in about 4 weeks (around 10/18/2014).   Allie Bossier, MD

## 2014-09-21 ENCOUNTER — Telehealth: Payer: Self-pay | Admitting: *Deleted

## 2014-09-21 NOTE — Telephone Encounter (Signed)
Patient called this morning between 830-9am and said she was having some bleeding and has not felt the baby move.  Patient stated in the office visit yesterday she has never felt the baby move but was reassured since she was 19 weeks she might not have felt movement yet but should start feeling movement soon.   She did not complain of any bleeding in the office visit yesterday.  The bleeding just started today patient said.  I advised patient go to [facility] to be evaluated.   The father of the baby got on the phone and said he was going to call the ambulance to come pick her up because they had no way to the hospital.  Patient did come in a vehicle yesterday to the office visit.

## 2014-09-24 ENCOUNTER — Inpatient Hospital Stay (HOSPITAL_COMMUNITY): Payer: Medicaid Other

## 2014-09-24 ENCOUNTER — Encounter (HOSPITAL_COMMUNITY): Payer: Self-pay | Admitting: *Deleted

## 2014-09-24 ENCOUNTER — Inpatient Hospital Stay (HOSPITAL_COMMUNITY)
Admission: AD | Admit: 2014-09-24 | Discharge: 2014-09-24 | Disposition: A | Discharge status: Home / Self Care | Payer: Medicaid Other | Source: Ambulatory Visit | Attending: Obstetrics & Gynecology | Admitting: Obstetrics & Gynecology

## 2014-09-24 DIAGNOSIS — F1910 Other psychoactive substance abuse, uncomplicated: Secondary | ICD-10-CM

## 2014-09-24 DIAGNOSIS — O99322 Drug use complicating pregnancy, second trimester: Secondary | ICD-10-CM | POA: Diagnosis not present

## 2014-09-24 DIAGNOSIS — F1410 Cocaine abuse, uncomplicated: Secondary | ICD-10-CM | POA: Diagnosis not present

## 2014-09-24 DIAGNOSIS — O99342 Other mental disorders complicating pregnancy, second trimester: Secondary | ICD-10-CM | POA: Insufficient documentation

## 2014-09-24 DIAGNOSIS — O4692 Antepartum hemorrhage, unspecified, second trimester: Secondary | ICD-10-CM | POA: Insufficient documentation

## 2014-09-24 DIAGNOSIS — F329 Major depressive disorder, single episode, unspecified: Secondary | ICD-10-CM | POA: Insufficient documentation

## 2014-09-24 DIAGNOSIS — Z880 Allergy status to penicillin: Secondary | ICD-10-CM | POA: Diagnosis not present

## 2014-09-24 DIAGNOSIS — J45909 Unspecified asthma, uncomplicated: Secondary | ICD-10-CM | POA: Insufficient documentation

## 2014-09-24 DIAGNOSIS — O99332 Smoking (tobacco) complicating pregnancy, second trimester: Secondary | ICD-10-CM | POA: Diagnosis not present

## 2014-09-24 DIAGNOSIS — Z3A19 19 weeks gestation of pregnancy: Secondary | ICD-10-CM | POA: Diagnosis not present

## 2014-09-24 DIAGNOSIS — O209 Hemorrhage in early pregnancy, unspecified: Secondary | ICD-10-CM | POA: Diagnosis not present

## 2014-09-24 DIAGNOSIS — O10912 Unspecified pre-existing hypertension complicating pregnancy, second trimester: Secondary | ICD-10-CM | POA: Diagnosis not present

## 2014-09-24 LAB — URINALYSIS, ROUTINE W REFLEX MICROSCOPIC
BILIRUBIN URINE: NEGATIVE
Glucose, UA: NEGATIVE mg/dL
Ketones, ur: 15 mg/dL — AB
Nitrite: NEGATIVE
PROTEIN: NEGATIVE mg/dL
UROBILINOGEN UA: 0.2 mg/dL (ref 0.0–1.0)
pH: 5.5 (ref 5.0–8.0)

## 2014-09-24 LAB — RAPID URINE DRUG SCREEN, HOSP PERFORMED
Amphetamines: NOT DETECTED
Barbiturates: NOT DETECTED
Benzodiazepines: NOT DETECTED
Cocaine: POSITIVE — AB
OPIATES: NOT DETECTED
Tetrahydrocannabinol: POSITIVE — AB

## 2014-09-24 LAB — URINE MICROSCOPIC-ADD ON

## 2014-09-24 LAB — WET PREP, GENITAL
TRICH WET PREP: NONE SEEN
Yeast Wet Prep HPF POC: NONE SEEN

## 2014-09-24 MED ORDER — FAMOTIDINE 20 MG PO TABS
10.0000 mg | ORAL_TABLET | Freq: Once | ORAL | Status: AC
  Administered 2014-09-24: 10 mg via ORAL
  Filled 2014-09-24: qty 1

## 2014-09-24 MED ORDER — SULFAMETHOXAZOLE-TRIMETHOPRIM 800-160 MG PO TABS: 1.0000 | ORAL_TABLET | Freq: Two times a day (BID) | ORAL | Status: AC

## 2014-09-24 MED ORDER — PROGESTERONE MICRONIZED 200 MG PO CAPS: 200.0000 mg | ORAL_CAPSULE | Freq: Every day | ORAL | Status: DC

## 2014-09-24 NOTE — MAU Provider Note (Signed)
History     CSN: [id number]  Arrival date and time: 09/24/14 8413   First Provider Initiated Contact with Patient 09/24/14 1041      Chief Complaint  Patient presents with  . Vaginal Bleeding   HPI Nicole Mahoney 34 y.o. [redacted]w[redacted]d  Client of Mila Merry comes to MAU today with vaginal bleeding in pregnancy.  States she has occasional pulling and tightness in the lower pelvis.  States she started having vaginal bleeding after her appointment in the office on  Tuesday.  States she walks about a mile to [facility] to be seen and walks home.  Has known history of cocaine use with multiple positive urine drug screens.  States her last use was on Thursday.  Comes today as the bleeding has increased in color and amount.  It was light pink earlier in the week.  Is brighter red today.  Has a history of Major Depressive Disorder.  Sees a therapist in [facility] and her last visit was 3 months ago.  She has stopped taking her medication for depression.  Discussed with client that use of cocaine in pregnancy can cause hemorrhage, death of the baby and her own death.  States she will start her depression medication again and it will help her not use cocaine.  States she used cocaine with her other 3 pregnancies - just a small amount at a time and it did not cause a problem.  Advised every pregnancy is different.  Advised to stop all use of cocaine.  OB History    Gravida Para Term Preterm AB TAB SAB Ectopic Multiple Living   4 3 3       0 3      Past Medical History  Diagnosis Date  . Bronchitis, acute   . Asthma   . Hypertension   . Drug abuse, cocaine type   . Drug abuse, marijuana     Past Surgical History  Procedure Laterality Date  . Hand surgery    . Appendectomy      Family History  Problem Relation Age of Onset  . Cancer Other   . Asthma Mother   . Asthma Father   . Depression Father   . Depression Sister   . Depression Brother   . Diabetes Paternal Grandmother      Social History  Substance Use Topics  . Smoking status: Current Every Day Smoker -- 0.25 packs/day    Types: Cigarettes  . Smokeless tobacco: Never Used  . Alcohol Use: No     Comment: "occasional drinks"    Allergies:  Allergies  Allergen Reactions  . Penicillins Nausea And Vomiting  . Percocet [Oxycodone-Acetaminophen] Nausea And Vomiting    Prescriptions prior to admission  Medication Sig Dispense Refill Last Dose  . acetaminophen (TYLENOL) 500 MG tablet Take 500 mg by mouth every 6 (six) hours as needed.   Past Week at Unknown time  . famotidine (PEPCID) 20 MG tablet Take 1 tablet (20 mg total) by mouth 2 (two) times daily. 30 tablet 0 Past Month at Unknown time  . Prenatal Multivit-Min-Fe-FA (PRENATAL VITAMINS) 0.8 MG tablet Take 1 tablet by mouth daily. 30 tablet 12 09/24/2014 at Unknown time  . promethazine (PHENERGAN) 25 MG tablet Take 1 tablet (25 mg total) by mouth every 6 (six) hours as needed for nausea or vomiting. 30 tablet 0 09/23/2014 at Unknown time  . sertraline (ZOLOFT) 50 MG tablet Take 1 tablet (50 mg total) by mouth daily. (Patient not taking:  Reported on 09/24/2014) 30 tablet 10 Not Taking at Unknown time    Review of Systems  Constitutional: Negative for fever.  Gastrointestinal: Positive for abdominal pain. Negative for nausea and vomiting.  Genitourinary:       No vaginal discharge. Vaginal bleeding. No dysuria.   Physical Exam   Blood pressure 129/51, pulse 84, temperature 98.6 F (37 C), resp. rate 18, height  (1.727 m), weight 175 lb (79.379 kg), last menstrual period 05/09/2014, unknown if currently breastfeeding.  Physical Exam  Nursing note and vitals reviewed. Constitutional: She is oriented to person, place, and time. She appears well-developed and well-nourished.  HENT:  Head: Normocephalic.  Eyes: EOM are normal.  Neck: Neck supple.  GI: Soft.  Very mild lower abdominal tenderness.  No uterine contractions palpated.  FHT heard  by doppler.  Genitourinary:  Speculum exam: Vagina - Small amount of watery, bloody discharge, no odor Cervix - No contact bleeding, no active bleeding, appears closed Bimanual exam: Cervix closed and feels firm Uterus fundal height just below the umbilicus Adnexa non tender, no masses bilaterally GC/Chlam, wet prep done Chaperone present for exam.  Musculoskeletal: Normal range of motion.  Neurological: She is alert and oriented to person, place, and time.  Skin: Skin is warm and dry.  Psychiatric: She has a normal mood and affect.    MAU Course  Procedures Results for orders placed or performed during the hospital encounter of 09/24/14 (from the past 24 hour(s))  Urinalysis, Routine w reflex microscopic (not at [facility])     Status: Abnormal   Collection Time: 09/24/14 10:10 AM  Result Value Ref Range   Color, Urine YELLOW YELLOW   APPearance CLEAR CLEAR   Specific Gravity, Urine <1.005 (L) 1.005 - 1.030   pH 5.5 5.0 - 8.0   Glucose, UA NEGATIVE NEGATIVE mg/dL   Hgb urine dipstick LARGE (A) NEGATIVE   Bilirubin Urine NEGATIVE NEGATIVE   Ketones, ur 15 (A) NEGATIVE mg/dL   Protein, ur NEGATIVE NEGATIVE mg/dL   Urobilinogen, UA 0.2 0.0 - 1.0 mg/dL   Nitrite NEGATIVE NEGATIVE   Leukocytes, UA MODERATE (A) NEGATIVE  Urine rapid drug screen (hosp performed)     Status: Abnormal   Collection Time: 09/24/14 10:10 AM  Result Value Ref Range   Opiates NONE DETECTED NONE DETECTED   Cocaine POSITIVE (A) NONE DETECTED   Benzodiazepines NONE DETECTED NONE DETECTED   Amphetamines NONE DETECTED NONE DETECTED   Tetrahydrocannabinol POSITIVE (A) NONE DETECTED   Barbiturates NONE DETECTED NONE DETECTED  Urine microscopic-add on     Status: Abnormal   Collection Time: 09/24/14 10:10 AM  Result Value Ref Range   Squamous Epithelial / LPF FEW (A) RARE   WBC, UA 7-10 <3 WBC/hpf   RBC / HPF 3-6 <3 RBC/hpf   Bacteria, UA FEW (A) RARE  Wet prep, genital     Status: Abnormal   Collection  Time: 09/24/14 11:05 AM  Result Value Ref Range   Yeast Wet Prep HPF POC NONE SEEN NONE SEEN   Trich, Wet Prep NONE SEEN NONE SEEN   Clue Cells Wet Prep HPF POC FEW (A) NONE SEEN   WBC, Wet Prep HPF POC FEW (A) NONE SEEN    MDM Reviewed the preliminary ultrasound report.  Cervical shortening noted especially when compared with ultrasound on 09-16-14.  Consult with Dr. Despina Hidden.  Will give Prometrium tablets to be used vaginally and discharge.  Assessment and Plan  Bleeding in pregnancy, second trimester Shortened cervix Positive  cocaine and marijuana in urine drug screen  Plan: STOP ALL COCAINE!  Get your prescription filled and insert a tablet deep into the vagina every night beginning tonight.  Return if you have worsening pain or worsening bleeding.  Be seen in the office this week and have them review your records from today.  Be seen by your doctor before you go back to work.  Do not have sex or put anything other than your medicine into the vagina.  Drink 8 glasses of water every day.  Call your therapist and make an appointment to be seen to help you with your substance abuse problems Message sent to [facility] to have patient come into the office this week for evaluation. Urine culture pending  - possible UTI - treatment sent to pharmacy - not discussed with client  - she was in a hurry to leave - left her phone charging cord in MAU.  Katherinne Mofield 09/24/2014, 1:56 PM

## 2014-09-24 NOTE — Discharge Instructions (Signed)
STOP ALL COCAINE! Get your prescription filled and insert a tablet deep into the vagina every night beginning tonight. Return if you have worsening pain or worsening bleeding. Be seen in the office this week and have them review your records from today. Be seen by your doctor before you go back to work. Do not have sex or put anything other than your medicine into the vagina. Drink 8 glasses of water every day. Call your therapist and make an appointment to be seen to help you with your substance abuse problems.

## 2014-09-24 NOTE — MAU Note (Signed)
Pt presents to MAU via EMS for complaints of vaginal bleeding when she wipes. Denies any abnormal discharge

## 2014-09-26 LAB — GC/CHLAMYDIA PROBE AMP (~~LOC~~) NOT AT ARMC
CHLAMYDIA, DNA PROBE: NEGATIVE
NEISSERIA GONORRHEA: NEGATIVE

## 2014-09-26 LAB — URINE CULTURE

## 2014-09-27 ENCOUNTER — Encounter: Payer: Self-pay | Admitting: Obstetrics & Gynecology

## 2014-09-27 ENCOUNTER — Telehealth: Payer: Self-pay | Admitting: *Deleted

## 2014-09-27 NOTE — Telephone Encounter (Signed)
Pt called adv has had severe vaginal bleeding and started using progesterone vaginal suppositories with no relief. Adv pt to report to MAU if vaginal bleeding has increased. Pt expressed understanding.

## 2014-10-04 ENCOUNTER — Encounter: Payer: Self-pay | Admitting: Obstetrics and Gynecology

## 2014-10-04 ENCOUNTER — Ambulatory Visit (INDEPENDENT_AMBULATORY_CARE_PROVIDER_SITE_OTHER): Payer: Medicaid Other | Admitting: Obstetrics and Gynecology

## 2014-10-04 VITALS — BP 119/65 | HR 71 | Temp 99.1°F | Wt 171.6 lb

## 2014-10-04 DIAGNOSIS — F329 Major depressive disorder, single episode, unspecified: Secondary | ICD-10-CM

## 2014-10-04 DIAGNOSIS — O99340 Other mental disorders complicating pregnancy, unspecified trimester: Secondary | ICD-10-CM

## 2014-10-04 DIAGNOSIS — F1920 Other psychoactive substance dependence, uncomplicated: Secondary | ICD-10-CM

## 2014-10-04 DIAGNOSIS — F1290 Cannabis use, unspecified, uncomplicated: Secondary | ICD-10-CM

## 2014-10-04 DIAGNOSIS — O99320 Drug use complicating pregnancy, unspecified trimester: Secondary | ICD-10-CM | POA: Diagnosis not present

## 2014-10-04 DIAGNOSIS — Z23 Encounter for immunization: Secondary | ICD-10-CM

## 2014-10-04 DIAGNOSIS — O99322 Drug use complicating pregnancy, second trimester: Secondary | ICD-10-CM | POA: Diagnosis not present

## 2014-10-04 DIAGNOSIS — F1921 Other psychoactive substance dependence, in remission: Secondary | ICD-10-CM

## 2014-10-04 DIAGNOSIS — O0992 Supervision of high risk pregnancy, unspecified, second trimester: Secondary | ICD-10-CM | POA: Diagnosis not present

## 2014-10-04 DIAGNOSIS — F1210 Cannabis abuse, uncomplicated: Secondary | ICD-10-CM | POA: Diagnosis not present

## 2014-10-04 DIAGNOSIS — F1410 Cocaine abuse, uncomplicated: Secondary | ICD-10-CM | POA: Diagnosis not present

## 2014-10-04 LAB — POCT URINALYSIS DIP (DEVICE)
BILIRUBIN URINE: NEGATIVE
GLUCOSE, UA: NEGATIVE mg/dL
Ketones, ur: NEGATIVE mg/dL
Nitrite: NEGATIVE
PROTEIN: NEGATIVE mg/dL
Specific Gravity, Urine: 1.01 (ref 1.005–1.030)
Urobilinogen, UA: 0.2 mg/dL (ref 0.0–1.0)
pH: 6 (ref 5.0–8.0)

## 2014-10-04 MED ORDER — METRONIDAZOLE 500 MG PO TABS: 500.0000 mg | ORAL_TABLET | Freq: Two times a day (BID) | ORAL | Status: DC

## 2014-10-04 NOTE — Progress Notes (Signed)
Subjective:  CHRISTE TELLEZ is a 34 y.o. [phone] at [redacted]w[redacted]d being seen today for ongoing prenatal care.  Patient reports persistent vaginal bleeding since 09/21/2014. Patient reports changing 2 minimally stained pads per day. She states it does not look like a regular period as the blood is pink, not red and very watery.  Contractions: Not present.  Vag. Bleeding: Large. Movement: Present. Denies leaking of fluid.   The following portions of the patient's history were reviewed and updated as appropriate: allergies, current medications, past family history, past medical history, past social history, past surgical history and problem list.   Objective:   Filed Vitals:   10/04/14 1437  BP: 119/65  Pulse: 71  Temp: 99.1 F (37.3 C)  Weight: 171 lb 9.6 oz (77.837 kg)    Fetal Status: Fetal Heart Rate (bpm): 155   Movement: Present     General:  Alert, oriented and cooperative. Patient is in no acute distress.  Skin: Skin is warm and dry. No rash noted.   Cardiovascular: Normal heart rate noted  Respiratory: Normal respiratory effort, no problems with respiration noted  Abdomen: Soft, gravid, appropriate for gestational age. Pain/Pressure: Absent     Pelvic: Vag. Bleeding: Large     SSE: thin pink watery discharge      closed/think  Extremities: Normal range of motion.  Edema: None  Mental Status: Normal mood and affect. Normal behavior. Normal judgment and thought content.   Urinalysis:      Assessment and Plan:  Pregnancy: G4P3003 at [redacted]w[redacted]d  1. Supervision of high-risk pregnancy, second trimester Patient is doing well Persistent pink discharge likely secondary to BV- Rx Flagyl provided Continue vaginal prometrium for CL 2.4 cm Follow up CL ultrasound ordered - US OB Limited; Future  2. Marijuana use Discussed cessasion  3. Drug dependence, antepartum, second trimester   4. Depression affecting pregnancy, antepartum   5. Cocaine abuse Discussed cessarion  6. Cocaine  abuse affecting pregnancy, antepartum   7. Flu vaccine need Received 10/04/2014 - Flu Vaccine QUAD 36+ mos IM; Standing - Flu Vaccine QUAD 36+ mos IM  General obstetric precautions including but not limited to vaginal bleeding, contractions, leaking of fluid and fetal movement were reviewed in detail with the patient. Please refer to After Visit Summary for other counseling recommendations.  No Follow-up on file.   Catalina Antigua, MD

## 2014-10-04 NOTE — Progress Notes (Signed)
Breastfeeding tip of the week reviewed Pt is taking Sulfamethoxazole TMP DS Flu vaccine

## 2014-10-11 ENCOUNTER — Observation Stay (HOSPITAL_COMMUNITY)
Admission: AD | Admit: 2014-10-11 | Discharge: 2014-10-13 | Disposition: A | Discharge status: Home / Self Care | Payer: Medicaid Other | Source: Ambulatory Visit | Attending: Obstetrics & Gynecology | Admitting: Obstetrics & Gynecology

## 2014-10-11 ENCOUNTER — Encounter (HOSPITAL_COMMUNITY): Payer: Self-pay | Admitting: Family

## 2014-10-11 DIAGNOSIS — F17210 Nicotine dependence, cigarettes, uncomplicated: Secondary | ICD-10-CM | POA: Insufficient documentation

## 2014-10-11 DIAGNOSIS — Z79899 Other long term (current) drug therapy: Secondary | ICD-10-CM | POA: Diagnosis not present

## 2014-10-11 DIAGNOSIS — O209 Hemorrhage in early pregnancy, unspecified: Secondary | ICD-10-CM | POA: Diagnosis present

## 2014-10-11 DIAGNOSIS — J45909 Unspecified asthma, uncomplicated: Secondary | ICD-10-CM | POA: Insufficient documentation

## 2014-10-11 DIAGNOSIS — O99512 Diseases of the respiratory system complicating pregnancy, second trimester: Secondary | ICD-10-CM | POA: Insufficient documentation

## 2014-10-11 DIAGNOSIS — O99332 Smoking (tobacco) complicating pregnancy, second trimester: Secondary | ICD-10-CM | POA: Insufficient documentation

## 2014-10-11 DIAGNOSIS — F1420 Cocaine dependence, uncomplicated: Secondary | ICD-10-CM

## 2014-10-11 DIAGNOSIS — O99324 Drug use complicating childbirth: Secondary | ICD-10-CM

## 2014-10-11 DIAGNOSIS — O26872 Cervical shortening, second trimester: Secondary | ICD-10-CM

## 2014-10-11 DIAGNOSIS — Z3A22 22 weeks gestation of pregnancy: Secondary | ICD-10-CM | POA: Insufficient documentation

## 2014-10-11 DIAGNOSIS — Z880 Allergy status to penicillin: Secondary | ICD-10-CM | POA: Diagnosis not present

## 2014-10-11 DIAGNOSIS — O10012 Pre-existing essential hypertension complicating pregnancy, second trimester: Secondary | ICD-10-CM | POA: Diagnosis not present

## 2014-10-11 DIAGNOSIS — F1410 Cocaine abuse, uncomplicated: Secondary | ICD-10-CM | POA: Diagnosis present

## 2014-10-11 DIAGNOSIS — F329 Major depressive disorder, single episode, unspecified: Secondary | ICD-10-CM | POA: Diagnosis present

## 2014-10-11 DIAGNOSIS — O99320 Drug use complicating pregnancy, unspecified trimester: Secondary | ICD-10-CM

## 2014-10-11 HISTORY — DX: Other mental disorders complicating pregnancy, unspecified trimester: O99.340

## 2014-10-11 HISTORY — DX: Major depressive disorder, single episode, unspecified: F32.9

## 2014-10-11 HISTORY — DX: Depression, unspecified: F32.A

## 2014-10-11 LAB — CBC
HEMATOCRIT: 31.2 % — AB (ref 36.0–46.0)
Hemoglobin: 10.4 g/dL — ABNORMAL LOW (ref 12.0–15.0)
MCH: 30.7 pg (ref 26.0–34.0)
MCHC: 33.3 g/dL (ref 30.0–36.0)
MCV: 92 fL (ref 78.0–100.0)
Platelets: 257 10*3/uL (ref 150–400)
RBC: 3.39 MIL/uL — AB (ref 3.87–5.11)
RDW: 13.2 % (ref 11.5–15.5)
WBC: 10.3 10*3/uL (ref 4.0–10.5)

## 2014-10-11 LAB — RAPID URINE DRUG SCREEN, HOSP PERFORMED
AMPHETAMINES: NOT DETECTED
BENZODIAZEPINES: NOT DETECTED
Barbiturates: NOT DETECTED
Cocaine: POSITIVE — AB
OPIATES: POSITIVE — AB
Tetrahydrocannabinol: POSITIVE — AB

## 2014-10-11 LAB — TYPE AND SCREEN
ABO/RH(D): O POS
Antibody Screen: NEGATIVE

## 2014-10-11 MED ORDER — HYDROMORPHONE HCL 1 MG/ML IJ SOLN: 1.0000 mg | Freq: Once | INTRAMUSCULAR | Status: DC

## 2014-10-11 MED ORDER — IBUPROFEN 600 MG PO TABS
600.0000 mg | ORAL_TABLET | Freq: Four times a day (QID) | ORAL | Status: DC | PRN
  Administered 2014-10-11 – 2014-10-13 (×5): 600 mg via ORAL
  Filled 2014-10-11 (×5): qty 1

## 2014-10-11 MED ORDER — OXYTOCIN 40 UNITS IN LACTATED RINGERS INFUSION - SIMPLE MED
INTRAVENOUS | Status: AC
  Filled 2014-10-11: qty 1000

## 2014-10-11 MED ORDER — DOCUSATE SODIUM 100 MG PO CAPS
100.0000 mg | ORAL_CAPSULE | Freq: Once | ORAL | Status: AC
  Administered 2014-10-11: 100 mg via ORAL
  Filled 2014-10-11: qty 1

## 2014-10-11 MED ORDER — OXYTOCIN 40 UNITS IN LACTATED RINGERS INFUSION - SIMPLE MED
250.0000 mL/h | Freq: Once | INTRAVENOUS | Status: AC
  Administered 2014-10-11: 250 mL/h via INTRAVENOUS

## 2014-10-11 MED ORDER — PRENATAL MULTIVITAMIN CH
1.0000 | ORAL_TABLET | Freq: Every day | ORAL | Status: DC
  Administered 2014-10-11 – 2014-10-12 (×2): 1 via ORAL
  Filled 2014-10-11 (×2): qty 1

## 2014-10-11 MED ORDER — SERTRALINE HCL 50 MG PO TABS
50.0000 mg | ORAL_TABLET | Freq: Every day | ORAL | Status: DC
  Administered 2014-10-11: 50 mg via ORAL
  Filled 2014-10-11 (×2): qty 1

## 2014-10-11 MED ORDER — MISOPROSTOL 200 MCG PO TABS: 1000.0000 ug | ORAL_TABLET | Freq: Once | ORAL | Status: DC

## 2014-10-11 MED ORDER — HYDROMORPHONE HCL 1 MG/ML IJ SOLN
INTRAMUSCULAR | Status: AC
  Administered 2014-10-11: 1 mg
  Filled 2014-10-11: qty 1

## 2014-10-11 MED ORDER — LACTATED RINGERS IV SOLN: INTRAVENOUS | Status: DC

## 2014-10-11 NOTE — Progress Notes (Signed)
UR chart review completed.  

## 2014-10-11 NOTE — Progress Notes (Addendum)
Patient arrived via EMS. Delivered at home out side on the ground, a nonviable female fetus @ approximately 0730 (per pt.).  Placenta undelivered. Bleeding mod. C/O cramping, distraught, crying. Margarita Mail, CNM called to bedside. True knot noted in umbilical cord. Fetus in room with patient.

## 2014-10-11 NOTE — MAU Provider Note (Signed)
History   [id number]   Chief Complaint  Patient presents with  . Vaginal Bleeding    HPI Nicole Mahoney is a 34 y.o. female  [phone] here at [redacted]w[redacted]d wks IUP by LMP consistent with 10 week ulrasound with report of vaginal delivery at home at approximately 0800 today.  Pt reports having rectal pressure last night around 2300, felt it was constipation and took a laxative.  Woke up around 0730 and felt like needed to have a bowel movement.  Proceeded to have vaginal delivery on toilet.  Pt brought to the Maternity Admissions Unit via EMS.    Pt seen at [facility] office.  Pregnancy complicated by history of cocaine and marijuana abuse and depression.  Shortened cervix identified this pregnancy and pt was using prometrium nightly.  Recently began Zoloft for depression which patient states has helped her minimize cocaine use.  Reports last use one week ago.    Patient's last menstrual period was 05/09/2014.  OB History  Gravida Para Term Preterm AB SAB TAB Ectopic Multiple Living  0 3    # Outcome Date GA Lbr Len/2nd Weight Sex Delivery Anes PTL Lv  4 Current           3 Term 02/23/14 [redacted]w[redacted]d 03:29 / 00:04 3.195 kg (7 lb 0.7 oz) M Vag-Spont EPI  Y  2 Term      Vag-Spont     1 Term      Vag-Spont         Past Medical History  Diagnosis Date  . Bronchitis, acute   . Asthma   . Hypertension   . Drug abuse, cocaine type   . Drug abuse, marijuana   . Depression affecting pregnancy, antepartum     Family History  Problem Relation Age of Onset  . Cancer Other   . Asthma Mother   . Asthma Father   . Depression Father   . Depression Sister   . Depression Brother   . Diabetes Paternal Grandmother     Social History   Social History  . Marital Status: Married    Spouse Name: N/A  . Number of Children: N/A  . Years of Education: N/A   Social History Main Topics  . Smoking status: Current Every Day Smoker -- 0.25 packs/day    Types: Cigarettes  . Smokeless tobacco:  Never Used  . Alcohol Use: No     Comment: "occasional drinks"  . Drug Use: Yes    Special: Marijuana, Cocaine     Comment: positive UDS for cocaine & MJ 12/11/13; states she uses cocaine on Mondays  . Sexual Activity: Yes   Other Topics Concern  . None   Social History Narrative    Allergies  Allergen Reactions  . Penicillins Nausea And Vomiting  . Percocet [Oxycodone-Acetaminophen] Nausea And Vomiting    No current facility-administered medications on file prior to encounter.   Current Outpatient Prescriptions on File Prior to Encounter  Medication Sig Dispense Refill  . acetaminophen (TYLENOL) 500 MG tablet Take 500 mg by mouth every 6 (six) hours as needed.    . metroNIDAZOLE (FLAGYL) 500 MG tablet Take 1 tablet (500 mg total) by mouth 2 (two) times daily. 14 tablet 0  . Prenatal Multivit-Min-Fe-FA (PRENATAL VITAMINS) 0.8 MG tablet Take 1 tablet by mouth daily. 30 tablet 12  . progesterone (PROMETRIUM) 200 MG capsule Place 1 capsule (200 mg total) vaginally daily. 30 capsule 0  .  promethazine (PHENERGAN) 25 MG tablet Take 1 tablet (25 mg total) by mouth every 6 (six) hours as needed for nausea or vomiting. (Patient not taking: Reported on 10/04/2014) 30 tablet 0  . sulfamethoxazole-trimethoprim (BACTRIM DS,SEPTRA DS) 800-160 MG per tablet Take 1 tablet by mouth 2 (two) times daily.       Review of Systems  Gastrointestinal: Negative for nausea and vomiting.  Genitourinary: Positive for vaginal bleeding, vaginal pain and pelvic pain.  Neurological: Positive for dizziness. Negative for headaches.  All other systems reviewed and are negative.    Physical Exam   Filed Vitals:   10/11/14 0836 10/11/14 0904  BP: 139/71 108/63  Pulse: 95 78  Temp: 99.5 F (37.5 C)   TempSrc: Oral   Resp: 22 18    Physical Exam  Constitutional: She is oriented to person, place, and time. She appears well-developed and well-nourished. She appears distressed (tearful, crying).  HENT:   Head: Normocephalic.  Neck: Normal range of motion. Neck supple.  Cardiovascular: Normal rate, regular rhythm and normal heart sounds.   Respiratory: Effort normal and breath sounds normal. No respiratory distress.  GI: Soft. There is tenderness.  Genitourinary: There is bleeding in the vagina.  Fetus in bedpan; placenta not delivered; knot noted in umbilical cord.  Blood clots covering perineum.    Musculoskeletal: Normal range of motion. She exhibits no edema.  Neurological: She is alert and oriented to person, place, and time.  Skin: Skin is warm and dry.  Psychiatric: Her speech is slurred.  Glazed appearance in eyes; responds to questions appropriately   0900 Dr. Debroah Loop called to bedside to assess patient and discuss plan of care > give cytotec 1000 mg PR and IV Pitocin, administer pain medication MAU Course  Procedures  MDM CBC, T&S, urine UDS ordered IV Dilaudid 1 mg IV Pitocin 40 mg in 1000 LR ordered Cytotec 1000 mg PR ordered  0915 delivery of intact appearing placenta  Assessment and Plan  34 y.o. [id number] at [redacted]w[redacted]d IUP  Preterm Vaginal delivery/home  Plan: Admit to Women's Unit Observation status Placenta to pathology  Marlis Edelson, CNM 10/11/2014 9:29 AM

## 2014-10-11 NOTE — H&P (Signed)
History   [id number]   Chief Complaint  Patient presents with  . Vaginal Bleeding    HPI Nicole Mahoney is a 34 y.o. female  G4P3003 here at [redacted]w[redacted]d wks IUP by LMP consistent with 10 week ulrasound with report of vaginal delivery at home at approximately 0800 today.  Pt reports having rectal pressure last night around 2300, felt it was constipation and took a laxative.  Woke up around 0730 and felt like needed to have a bowel movement.  Proceeded to have vaginal delivery on toilet.  Pt brought to the Maternity Admissions Unit via EMS.    Pt seen at [place] office.  Pregnancy complicated by history of cocaine and marijuana abuse and depression.  Shortened cervix identified this pregnancy and pt was using prometrium nightly.  Recently began Zoloft for depression which patient states has helped her minimize cocaine use.  Reports last use one week ago.    Patient's last menstrual period was 05/09/2014.  OB History  Gravida Para Term Preterm AB SAB TAB Ectopic Multiple Living  4 3 3      0 3    # Outcome Date GA Lbr Len/2nd Weight Sex Delivery Anes PTL Lv  4 Current           3 Term 02/23/14 [redacted]w[redacted]d 03:29 / 00:04 3.195 kg (7 lb 0.7 oz) M Vag-Spont EPI  Y  2 Term      Vag-Spont     1 Term      Vag-Spont         Past Medical History  Diagnosis Date  . Bronchitis, acute   . Asthma   . Hypertension   . Drug abuse, cocaine type   . Drug abuse, marijuana   . Depression affecting pregnancy, antepartum     Family History  Problem Relation Age of Onset  . Cancer Other   . Asthma Mother   . Asthma Father   . Depression Father   . Depression Sister   . Depression Brother   . Diabetes Paternal Grandmother     Social History   Social History  . Marital Status: Married    Spouse Name: N/A  . Number of Children: N/A  . Years of Education: N/A   Social History Main Topics  . Smoking status: Current Every Day Smoker -- 0.25 packs/day    Types: Cigarettes  . Smokeless tobacco:  Never Used  . Alcohol Use: No     Comment: "occasional drinks"  . Drug Use: Yes    Special: Marijuana, Cocaine     Comment: positive UDS for cocaine & MJ 12/11/13; states she uses cocaine on Mondays  . Sexual Activity: Yes   Other Topics Concern  . None   Social History Narrative    Allergies  Allergen Reactions  . Penicillins Nausea And Vomiting  . Percocet [Oxycodone-Acetaminophen] Nausea And Vomiting    No current facility-administered medications on file prior to encounter.   Current Outpatient Prescriptions on File Prior to Encounter  Medication Sig Dispense Refill  . acetaminophen (TYLENOL) 500 MG tablet Take 500 mg by mouth every 6 (six) hours as needed.    . metroNIDAZOLE (FLAGYL) 500 MG tablet Take 1 tablet (500 mg total) by mouth 2 (two) times daily. 14 tablet 0  . Prenatal Multivit-Min-Fe-FA (PRENATAL VITAMINS) 0.8 MG tablet Take 1 tablet by mouth daily. 30 tablet 12  . progesterone (PROMETRIUM) 200 MG capsule Place 1 capsule (200 mg total) vaginally daily. 30 capsule 0  .   promethazine (PHENERGAN) 25 MG tablet Take 1 tablet (25 mg total) by mouth every 6 (six) hours as needed for nausea or vomiting. (Patient not taking: Reported on 10/04/2014) 30 tablet 0  . sulfamethoxazole-trimethoprim (BACTRIM DS,SEPTRA DS) 800-160 MG per tablet Take 1 tablet by mouth 2 (two) times daily.       Review of Systems  Gastrointestinal: Negative for nausea and vomiting.  Genitourinary: Positive for vaginal bleeding, vaginal pain and pelvic pain.  Neurological: Positive for dizziness. Negative for headaches.  All other systems reviewed and are negative.    Physical Exam   Filed Vitals:   10/11/14 0836 10/11/14 0904  BP: 139/71 108/63  Pulse: 95 78  Temp: 99.5 F (37.5 C)   TempSrc: Oral   Resp: 22 18    Physical Exam  Constitutional: She is oriented to person, place, and time. She appears well-developed and well-nourished. She appears distressed (tearful, crying).  HENT:   Head: Normocephalic.  Neck: Normal range of motion. Neck supple.  Cardiovascular: Normal rate, regular rhythm and normal heart sounds.   Respiratory: Effort normal and breath sounds normal. No respiratory distress.  GI: Soft. There is tenderness.  Genitourinary: There is bleeding in the vagina.  Fetus in bedpan; placenta not delivered; knot noted in umbilical cord.  Blood clots covering perineum.    Musculoskeletal: Normal range of motion. She exhibits no edema.  Neurological: She is alert and oriented to person, place, and time.  Skin: Skin is warm and dry.  Psychiatric: Her speech is slurred.  Glazed appearance in eyes; responds to questions appropriately   0900 Dr. Arnold called to bedside to assess patient and discuss plan of care > give cytotec 1000 mg PR and IV Pitocin, administer pain medication MAU Course  Procedures  MDM CBC, T&S, urine UDS ordered IV Dilaudid 1 mg IV Pitocin 40 mg in 1000 LR ordered Cytotec 1000 mg PR ordered  0915 delivery of intact appearing placenta  Assessment and Plan  34 y.o. G4P3003 at [redacted]w[redacted]d IUP  Preterm Vaginal delivery/home  Plan: Admit to Women's Unit Observation status Placenta to pathology  Kimetha Trulson N Karim, CNM 10/11/2014 9:29 AM     

## 2014-10-11 NOTE — Progress Notes (Addendum)
Placenta passed intact.

## 2014-10-11 NOTE — Progress Notes (Signed)
Police officer in to speak with patient. Police Archivist and L. Clelia Croft, RN [facility]) went to view fetus.

## 2014-10-11 NOTE — Progress Notes (Signed)
CSW received call from [place] in order to collaborate on case.  CSW has met with patient during previous admissions.  CSW reviewed chart, and will follow up with patient on 9/7 (per Chaplain, patient is resting and would benefit from sleep).  Contact CSW if needs arise prior to assessment.  Lucita Ferrara, LCSW [phone]

## 2014-10-11 NOTE — Progress Notes (Signed)
I spent time with pt, who goes by the name Nicole Mahoney.  She was grieving deeply during our time together.  She was holding her baby when FOB entered the room.  He was emotionally not able to look at their child and left the premises and has not been back.   She has been sleeping on and off throughout the afternoon.  I will check in on her tomorrow when I come in, but please page over the evening if needs arise.  [address] [place] Pager, [phone] 4:43 PM    10/11/14 1600  Clinical Encounter Type  Visited With Patient  Visit Type Spiritual support  Referral From Nurse  Consult/Referral To Social work  Spiritual Encounters  Spiritual Needs Emotional;Grief support

## 2014-10-12 ENCOUNTER — Encounter (HOSPITAL_COMMUNITY): Payer: Self-pay | Admitting: Family Medicine

## 2014-10-12 LAB — RPR: RPR Ser Ql: NONREACTIVE

## 2014-10-12 MED ORDER — SERTRALINE HCL 100 MG PO TABS
100.0000 mg | ORAL_TABLET | Freq: Every day | ORAL | Status: DC
  Administered 2014-10-12: 100 mg via ORAL
  Filled 2014-10-12: qty 1

## 2014-10-12 NOTE — Progress Notes (Signed)
Post Partum Day 1 for IUFD Subjective:  SHAMYA MACFADDEN is a 34 y.o. [phone] Unknown s/p NSVD at home of fetal demise.  No acute events overnight.  Pt denies problems with ambulating, voiding or po intake.  She denies nausea or vomiting.  Pain is moderately controlled.  Lochia Moderate.  Plan for birth control is Depo-Provera-  Desires pregnancy in the future.   Depression: patient reports "severe depression" that preceded her loss. She is on zoloft and requests a "strong medication." She endorses lack of motivation, guilt, lack of concentration, excessive sleep/sleep disturbance. Denies SI/HI. Has only been on zoloft and risperdal in the last. She reports she uses cocaine because she is depressed.   Objective: Blood pressure 129/51, pulse 79, temperature 98.5 F (36.9 C), temperature source Oral, resp. rate 18, height  (1.727 m), weight 171 lb (77.565 kg), SpO2 100 %, unknown if currently breastfeeding.  Physical Exam:  General: alert, cooperative and no distress Lochia:normal flow Chest: CTAB Heart: RRR no m/r/g Abdomen: +BS, soft, nontender,  Uterine Fundus: firm DVT Evaluation: No evidence of DVT seen on physical exam. Extremities: no edema  Recent Labs  10/11/14 0847  HGB 10.4*  HCT 31.2*  Assessment/Plan:  ASSESSMENT: NICKY MILHOUSE is a 35 y.o. [id number] Unknown s/p nsvd of non-viable fetus.   #IUFD: routine PP care. Home tomorrow.   #Depression: Patient is floridly positive on SIGECAPSContraception: would like depo prior to discharge. Will increase zoloft to  and plan to rapidly up-titrate in the outpatient setting. Needs to establish care with PCP  #Polysubstance abuse: SW involved.   Plan for discharge tomorrow   LOS: 1 day   Federico Flake 10/12/2014, 4:35 PM

## 2014-10-12 NOTE — Clinical Social Work Note (Signed)
Clinical Social Work Assessment  Patient Details  Name: Nicole Mahoney MRN: [id number] Date of Birth: 11-Jun-1980  Date of referral:  10/11/14               Reason for consult:  Substance Use/ETOH Abuse, Mental Health Concerns                Permission sought to share information with:  Hospital care team Permission granted to share information::  Yes, Verbal Permission Granted  Name::        Agency::     Relationship::     Contact Information:     Housing/Transportation Living arrangements for the past 2 months:  Patient stated that she lives with her boyfriend in their home.  Source of Information:  Patient Patient Interpreter Needed:  None Criminal Activity/Legal Involvement Pertinent to Current Situation/Hospitalization:  No -  Significant Relationships:  Significant Other: Reggie Lives with:  Significant Other Do you feel safe going back to the place where you live?  Yes Need for family participation in patient care:  No, but patient provided consent for him to remain present  Care giving concerns:  N/A  Facilities manager / plan:   CSW received request for consult due to patient presenting with a history of cocaine use during the pregnancy.  CSW has previously met patient in January 2016 s/p giving birth.  Patient stated that she remembers CSW, and was receptive to [place] visit.   Patient continues to actively grieve the loss of her baby, and openly processed with CSW her feelings of sadness and anger.  CSW attempted to explore and discuss her substance use and mental health, but conversations quickly returned to grief and loss. CSW provided supportive listening as patient reflected upon the loss and showed CSW her memory box that has been created. Patient confirmed that she has stable housing with her significant other (previously was homeless).  She stated that she was recently prescribed Zoloft, and she reported that cocaine use has not been a presenting problem since she  has been prescribed Zoloft (patient stated that intense depression triggers cocaine use).  Patient denied any need for substance abuse treatment at this time; however, patient denied any recent use which contrasts recent urine drug screens.   CSW attempted to create a plan to support patient's mental health at discharge, but problem solve was limited due to her presentation and intense grief.  Patient receptive to reviewing crisis resources, but no additional plan was able to be created.   Employment status:  Unemployed Forensic scientist:  Medicaid In [place] PT Recommendations:  No Follow Up Information / Referral to community resources:  CSW consulted with Chaplain to discuss grief and loss resources.  Chaplain to follow up with patient.  Patient declined need for referrals to substance use resources since she denied belief that substance abuse is a presenting problem.  CSW reviewed mental health crisis resources with patient, and patient reported familiarity with resources.   Patient/Family's Response to care:   Patient vocalized feelings of anger secondary to belief that she did not receive the care she needed or deserved to ensure that she carried the pregnancy to term.  Patient is agreeable to the ongoing hospitalization to recover from the delivery and fetal demise.  She continues to make progress related to identifying and contacting a funeral home in order to make arrangements. Patient presents as receptive to CSW and Chaplain interventions as she is eager to discuss her feelings with those who  will listen.   Patient/Family's Understanding of and Emotional Response to Diagnosis, Current Treatment, and Prognosis:   Patient continues to experience feelings of grief and loss s/p fetal demise.  Due to patient's grief, it was difficult to engage her in a conversation about substance use.  Patient does not identify a need for substance abuse treatment at this time, since she denied any recent use.   CSW did not confront patient with her positive drug screens due to patient's level of readiness.   Emotional Assessment Appearance:  Appears stated age, Developmentally appropriate, Disheveled Attitude/Demeanor/Rapport:  Grieving Affect (typically observed):  Angry, Restless, Tearful/Crying Orientation:  Oriented to Self, Oriented to Place, Oriented to  Time, Oriented to Situation Alcohol / Substance use:  Illicit Drugs Psych involvement (Current and /or in the community):  Outpatient Provider- patient is familiar with outpatient community proivders  Discharge Needs  Concerns to be addressed:  Patient is not in a place of readiness to address substance abuse as she grieves the fetal demise.  Readmission within the last 30 days:  No Current discharge risk:  None Barriers to Discharge:  Active Substance Use with limited insight on need to address the use in the future. CSW will follow up with patient on 9/8, and will re-assess patient's level of readiness.    Sheilah Mins, LCSW 10/12/2014, 5:10 PM

## 2014-10-12 NOTE — Progress Notes (Signed)
UR chart review completed.  

## 2014-10-13 MED ORDER — SERTRALINE HCL 100 MG PO TABS: 100.0000 mg | ORAL_TABLET | Freq: Every day | ORAL | Status: DC

## 2014-10-13 MED ORDER — IBUPROFEN 600 MG PO TABS: 600.0000 mg | ORAL_TABLET | Freq: Four times a day (QID) | ORAL | Status: DC | PRN

## 2014-10-13 NOTE — Progress Notes (Signed)
CSW continues to be in contact with [place], as support is offered to patient s/p fetal loss.  CSW briefly met with patient prior to her discharge.  Visit was brief due to patient and significant other reporting readiness for discharge.  Patient hyper-focused on wanting to leave and inquiring how CSW will be able to assist her to get home.  CSW offered bus voucher, but patient reported that they do not live on the bus line. Patient and significant other unable to identify additional supports who may be able to assist them as they prepare to discharge home.  CSW spoke with AD, taxi voucher approved.   CSW attempted to offer support related to loss, patient not interested in processing feelings. CSW informed patient of need to contact [facility] to schedule time to complete paperwork on Friday. Patient reported that she is aware of the need to complete paperwork, and shared "I won't forget about it, it's my baby". She stated that she will call once she returns home, and confirmed that she has the phone number for the funeral home.  No barriers to discharge. Patient's RN has been given the taxi voucher, and will assist with discharge.

## 2014-10-13 NOTE — Progress Notes (Signed)
I spent time with pt as she continued to grieve.  She requested to see her baby one more time and I stayed with her while she held the baby and had the opportunity to parent her baby.  They have chosen to use [facility] and will plan to go over to sign papers on Friday.  I offered grief support and emotional support.  [address] Dyanne Carrel, Bcc Pager, [phone] 8:33 AM    10/13/14 0800  Clinical Encounter Type  Visited With Patient  Visit Type Spiritual support  Consult/Referral To Social work  Spiritual Encounters  Spiritual Needs Emotional;Grief support

## 2014-10-13 NOTE — Discharge Summary (Signed)
Obstetric Discharge Summary Reason for Admission: s/p preterm delivery at home @ 22.1wks Prenatal Procedures: none Intrapartum Procedures: delivery of placenta & PP care Postpartum Procedures: none Complications-Operative and Postpartum: none HEMOGLOBIN  Date Value Ref Range Status  10/11/2014 10.4* 12.0 - 15.0 g/dL Final   HCT  Date Value Ref Range Status  10/11/2014 31.2* 36.0 - 46.0 % Final   Nicole Mahoney is a 34yo [id number] who came in at 22.1wks by 10wk scan having spontaneously delivered her baby at home. Her preg was complicated by hx of cocaine & THC use as well as depression for which she had recently been started on Zoloft. She reports last cocaine use x 1wk ago. Her placenta was delivered spont and intact in MAU and she was tx to her PP room on the 3rd floor. By PPD#2 she was doing well physically and was ready for discharge. SW consult has taken place- see note. She will f/u PP at either [facility] or the [facility] [place] [facility] clinic.  Physical Exam:  General: cooperative, fatigued and no distress  Heart: RRR Lungs: nl effort Lochia: appropriate Uterine Fundus: firm DVT Evaluation: No evidence of DVT seen on physical exam.  Discharge Diagnoses: Delivery of 22.1wk IUFD @ home  Discharge Information: Date: 10/13/2014 Activity: pelvic rest Diet: routine Medications: PNV, Ibuprofen and resume Flagyl (given 8/30 for BV) & Zoloft Condition: stable Instructions: refer to practice specific booklet Discharge to: home Follow-up Information    Follow up with [facility]. Schedule an appointment as soon as possible for a visit in 4 weeks.   Why:  For your postpartum appointment.   Contact information:   [address] [place] [address] [phone]      Newborn Data: Live born female  Birth Weight: 11.5 oz (326 g) APGAR: ,   Currently in morgue; will be tx to the care of the funeral home.  Cam Hai CNM 10/13/2014, 7:43 AM

## 2014-10-19 ENCOUNTER — Encounter: Payer: Self-pay | Admitting: Certified Nurse Midwife

## 2014-10-21 ENCOUNTER — Ambulatory Visit (HOSPITAL_COMMUNITY): Admission: RE | Admit: 2014-10-21 | Payer: Medicaid Other | Source: Ambulatory Visit

## 2014-11-10 ENCOUNTER — Ambulatory Visit (INDEPENDENT_AMBULATORY_CARE_PROVIDER_SITE_OTHER): Payer: Medicaid Other | Admitting: Obstetrics and Gynecology

## 2014-11-10 ENCOUNTER — Encounter: Payer: Self-pay | Admitting: Obstetrics and Gynecology

## 2014-11-10 VITALS — BP 134/86 | HR 53 | Resp 18 | Ht 70.0 in | Wt 174.0 lb

## 2014-11-10 DIAGNOSIS — Z30013 Encounter for initial prescription of injectable contraceptive: Secondary | ICD-10-CM

## 2014-11-10 DIAGNOSIS — Z01812 Encounter for preprocedural laboratory examination: Secondary | ICD-10-CM | POA: Diagnosis not present

## 2014-11-10 LAB — POCT URINE PREGNANCY: Preg Test, Ur: NEGATIVE

## 2014-11-10 MED ORDER — MEDROXYPROGESTERONE ACETATE 150 MG/ML IM SUSP
150.0000 mg | INTRAMUSCULAR | Status: AC
  Administered 2014-11-10: 150 mg via INTRAMUSCULAR

## 2014-11-10 MED ORDER — SERTRALINE HCL 100 MG PO TABS: 200.0000 mg | ORAL_TABLET | Freq: Every day | ORAL | Status: DC

## 2014-11-10 NOTE — Progress Notes (Signed)
  Subjective:     Nicole Mahoney is a 34 y.o. female who presents for a postpartum visit. She is 4 weeks postpartum following a spontaneous vaginal delivery of a 22 week fetus at home. I have fully reviewed the prenatal and intrapartum course. The delivery was at 22 gestational weeks. Outcome: spontaneous vaginal delivery at home with fetal demise. Anesthesia: none. Postpartum course has been uncomplicated. Bleeding moderate lochia. Bowel function is normal. Bladder function is normal. Patient is not sexually active. Contraception method is none. Postpartum depression screening: positive. Patient is currently incarcerated. She denies suicidal or homicidal ideations. She states that she has not been receiving the zoloft while incarcerated.     Review of Systems Pertinent items are noted in HPI.   Objective:    There were no vitals taken for this visit.  General:  alert, cooperative and no distress   Breasts:  inspection negative, no nipple discharge or bleeding, no masses or nodularity palpable  Lungs: clear to auscultation bilaterally  Heart:  regular rate and rhythm  Abdomen: soft, non-tender; bowel sounds normal; no masses,  no organomegaly   Vulva:  normal  Vagina: normal vagina, no discharge, exudate, lesion, or erythema  Cervix:  multiparous appearance and no cervical motion tenderness  Corpus: normal size, contour, position, consistency, mobility, non-tender  Adnexa:  normal adnexa and no mass, fullness, tenderness  Rectal Exam: Not performed.        Assessment:     Normal postpartum exam. Pap smear not done at today's visit.   Plan:    1. Contraception: Depo-Provera injections. Discussed normal vaginal bleeding up to 6 weeks following vaginal delivery. Vaginal bleeding should improve s/p depo-provera 2. Patient is medically cleared to resume all physical activities 3. Follow up in: 12 weeks or as needed.

## 2014-11-11 ENCOUNTER — Ambulatory Visit: Payer: Self-pay | Admitting: Family Medicine

## 2015-02-02 ENCOUNTER — Ambulatory Visit: Payer: Medicaid Other

## 2016-04-08 IMAGING — MR MR HEAD W/O CM
5 of 6 series · 25 of 48 positions shown · non-contrast
Comparison: Head CT same day

CLINICAL DATA: Personal history of hypertension and chronic
substance abuse. Acute allergic reaction. Somnolence. Lethargy.
Mental status changes.

EXAM:
MRI HEAD WITHOUT CONTRAST
MRA HEAD WITHOUT CONTRAST
TECHNIQUE: Multiplanar, multiecho pulse sequences of the brain and surrounding
structures were obtained without intravenous contrast. Angiographic
images of the head were obtained using MRA technique without
contrast.

[Series 3: DWI · axial · 5.0mm · 0.86mm/px · z∈[-95,+50]mm · 4 of 24 slices shown]
[im 1/24]
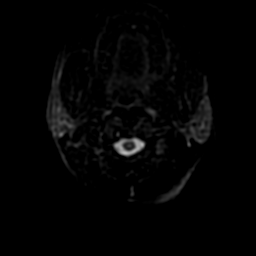
[im 8/24]
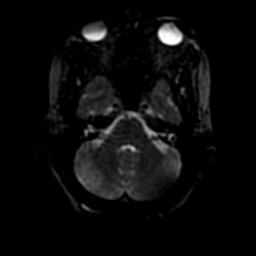
[im 16/24]
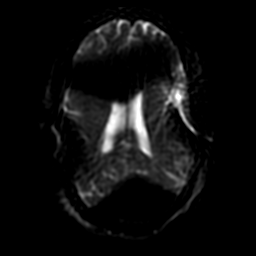
[im 24/24]
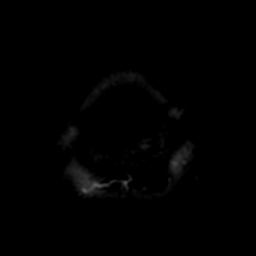

[Series 4: (id) mt fs · axial · 1.4mm · 0.43mm/px · z∈[-92,-33]mm · 6 of 136 slices shown]
[im 6/136]
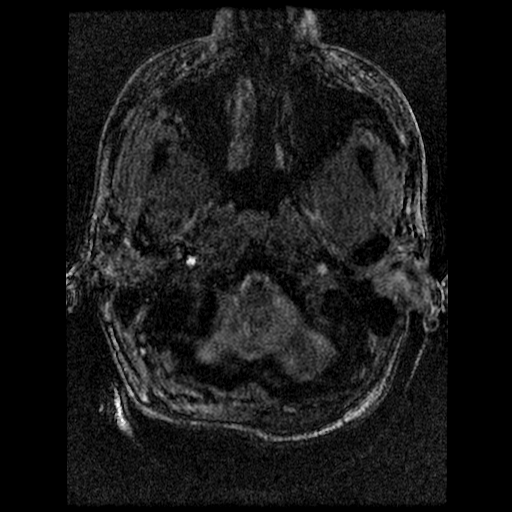
[im 26/136]
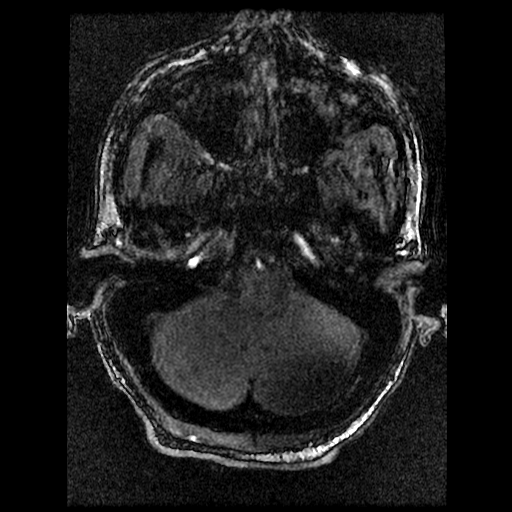
[im 41/136]
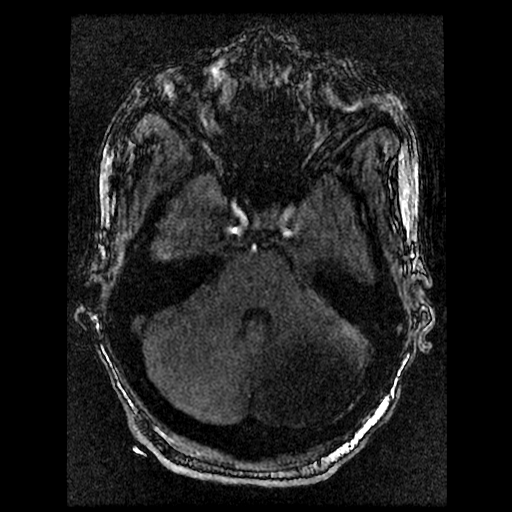
[im 61/136]
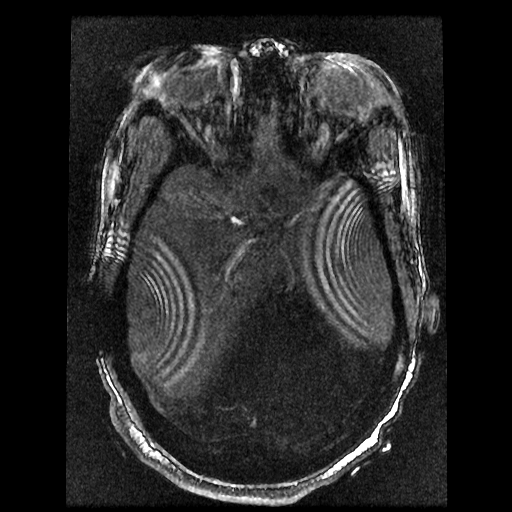
[im 76/136]
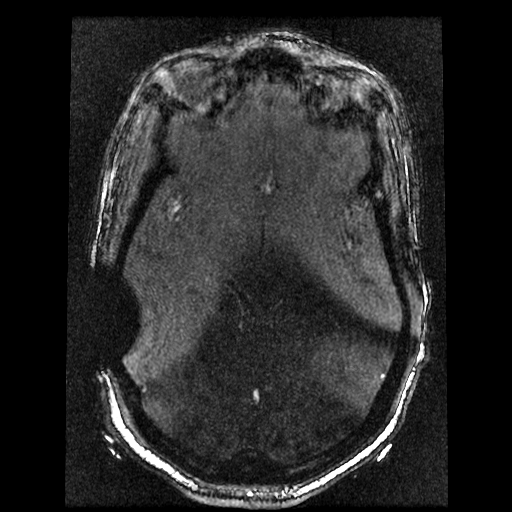
[im 96/136]
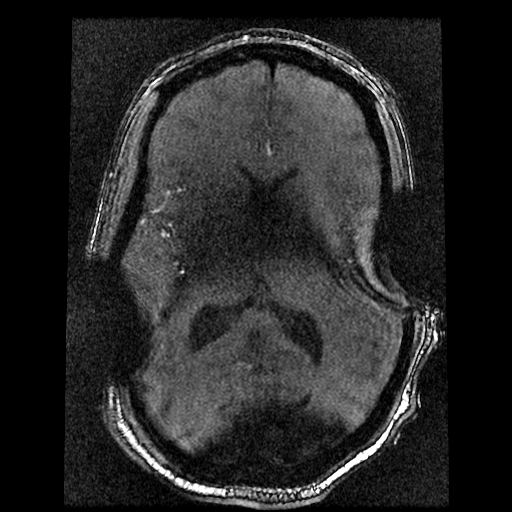

[Series 5: T1 · sagittal · 5.0mm · 0.49mm/px · 5 of 23 slices shown]
[im 1/23]
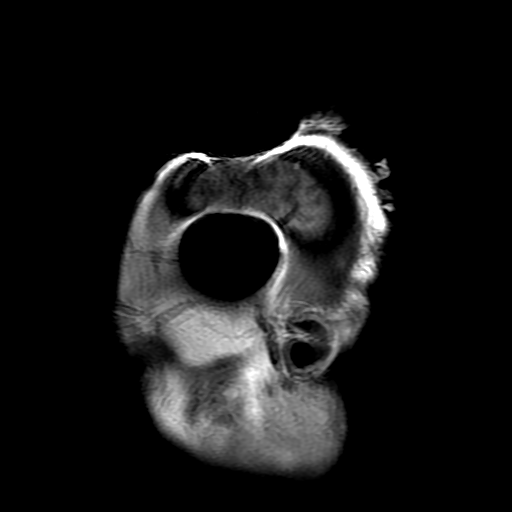
[im 6/23]
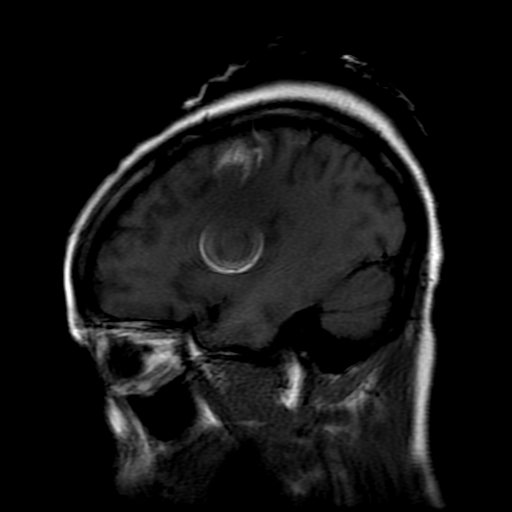
[im 12/23]
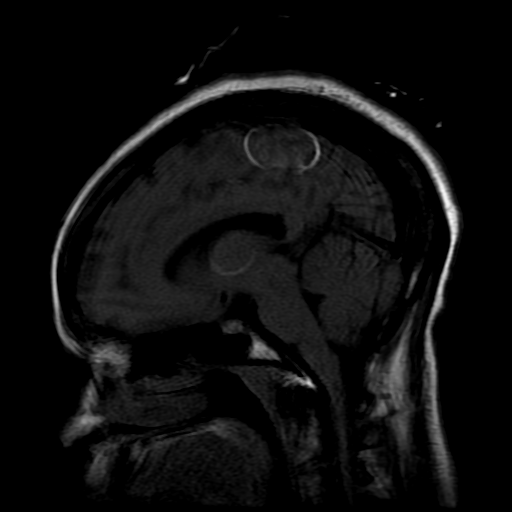
[im 17/23]
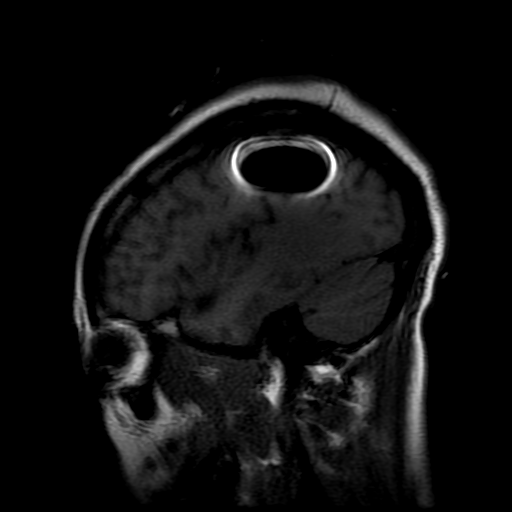
[im 23/23]
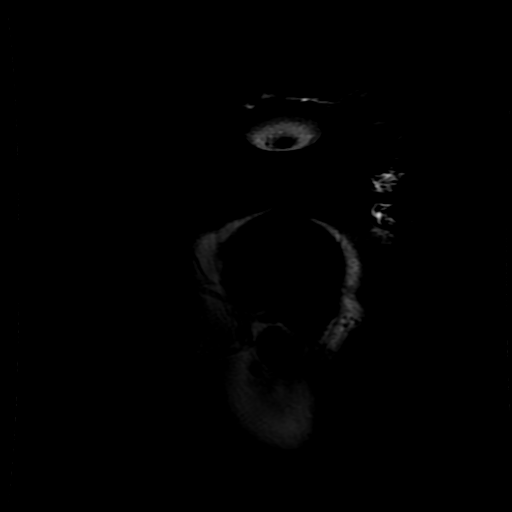

[Series 6: T2 · axial · 5.0mm · 0.43mm/px · z∈[-92,+40]mm · 5 of 24 slices shown]
[im 1/24]
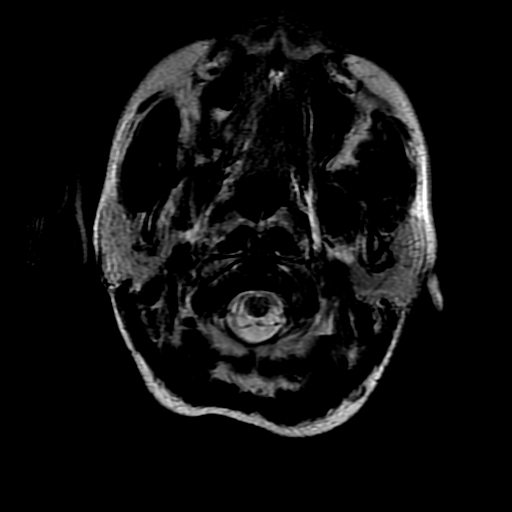
[im 6/24]
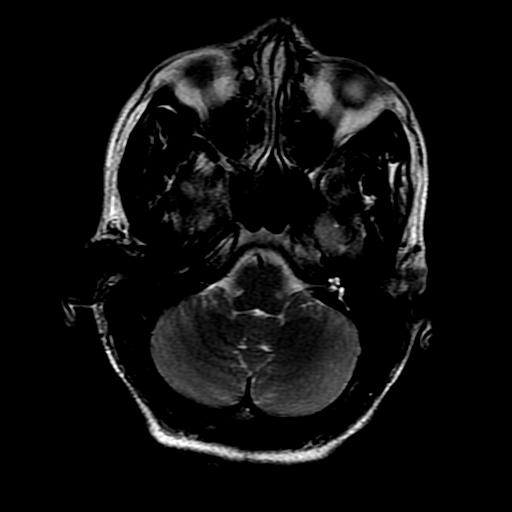
[im 12/24]
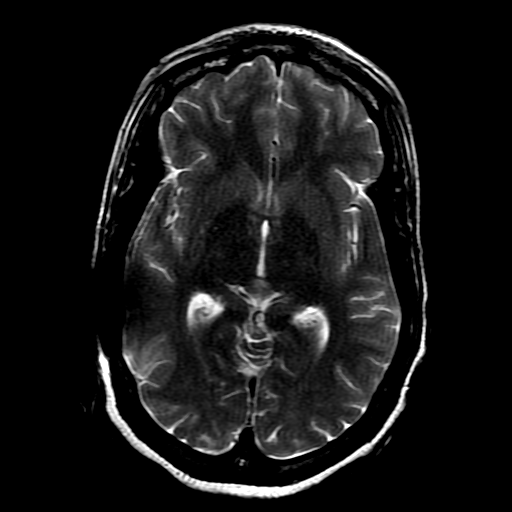
[im 18/24]
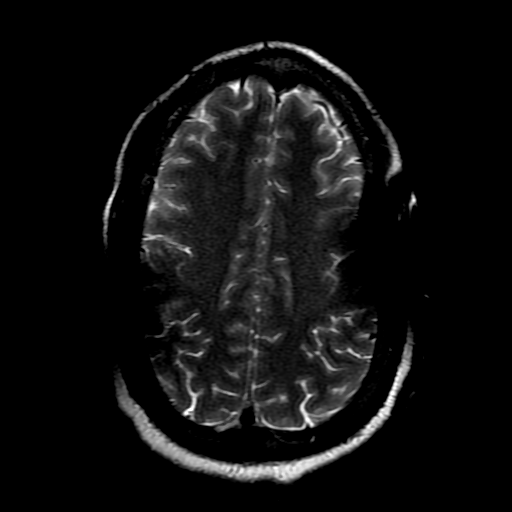
[im 24/24]
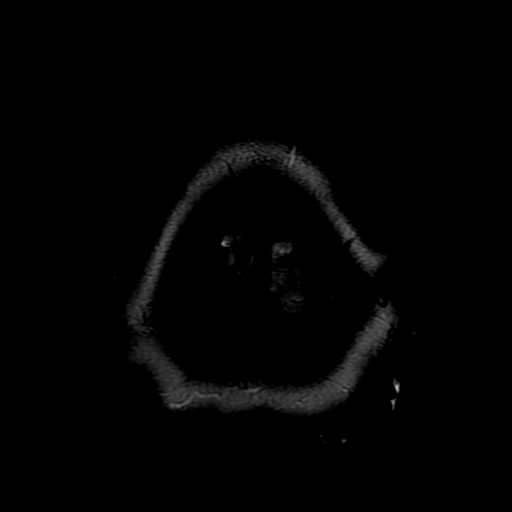

[Series 300: ADC · axial · 5.0mm · 0.86mm/px · z∈[-95,+50]mm · 5 of 24 slices shown]
[im 1/24]
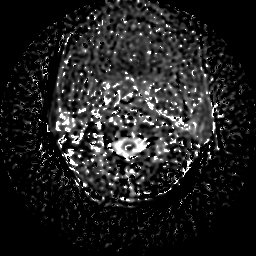
[im 6/24]
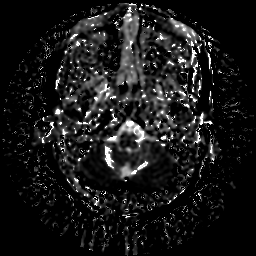
[im 12/24]
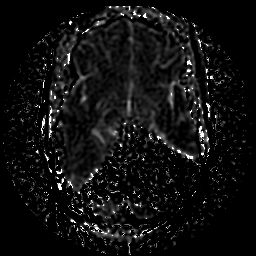
[im 18/24]
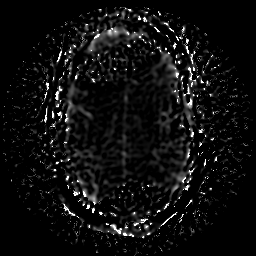
[im 24/24]
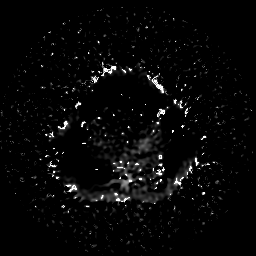

[25 of 48 positions shown; findings below may reference images not displayed]

FINDINGS: MRI HEAD FINDINGS

There is extensive artifact related to hair braids, making the
parenchymal imaging nearly useless. There does not appear to be any
brain mass, old infarction, identifiable hemorrhage, hydrocephalus
or extra-axial collection. No pituitary mass. Sinuses, middle ears
and mastoids are clear. No useful diffusion imaging.

MRA HEAD FINDINGS

Both internal carotid arteries are patent. There is considerable
artifact. On the right, we can't determine that the middle cerebral
artery and anterior cerebral artery are patent. On the left, there
is more artifact. I think those vessels are patent on the left as
well.

Both vertebral arteries are patent to the basilar. There is basilar
flow. No branch vessel detail available.
IMPRESSION: Very degraded study due to artifact from unremovable hair braids. No
brain parenchymal abnormality seen, with this limited information. I
think major vessels show flow, but detail is very limited.

## 2016-04-08 IMAGING — CT CT HEAD W/O CM
1 of 2 series · 16 of 30 positions shown, 20 images · non-contrast
Comparison: 10/12/2013

CLINICAL DATA: Altered mental status and decreased level of
consciousness.

EXAM:
CT HEAD WITHOUT CONTRAST
TECHNIQUE: Contiguous axial images were obtained from the base of the skull
through the vertex without intravenous contrast.

[Series 3: head 5.0 h30s · axial · 0.44mm/px · z∈[-152,-12]mm · 16 of 32 slices shown, 20 images]
[im 2/32  brain]
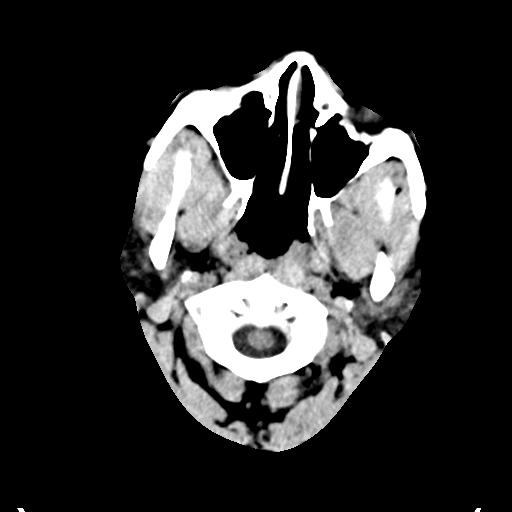
[im 2/32  bone]
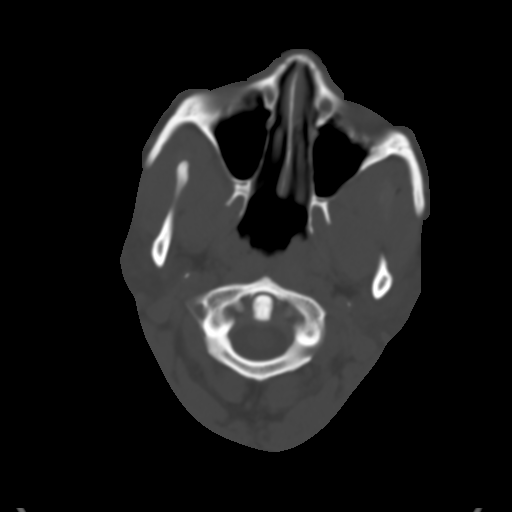
[im 4/32  brain]
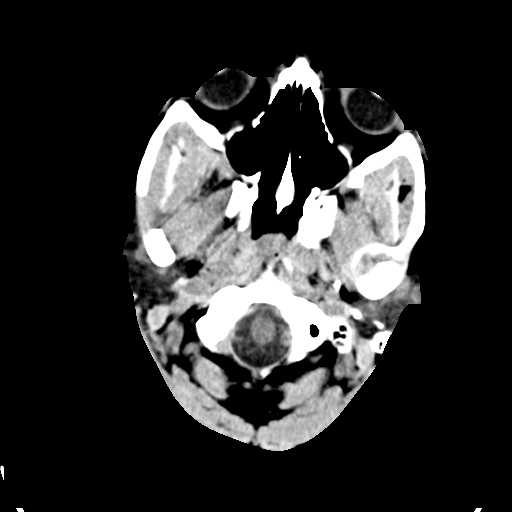
[im 6/32  brain]
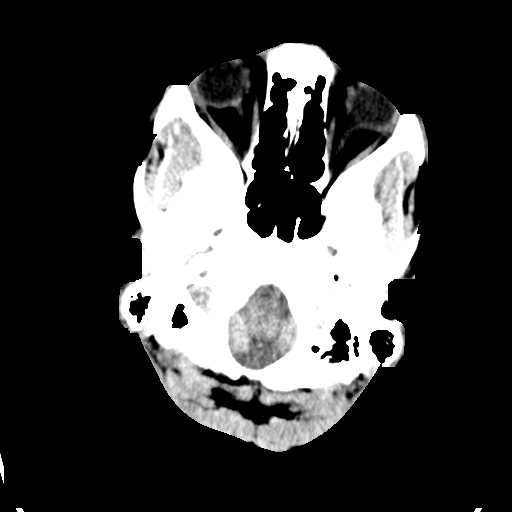
[im 8/32  brain]
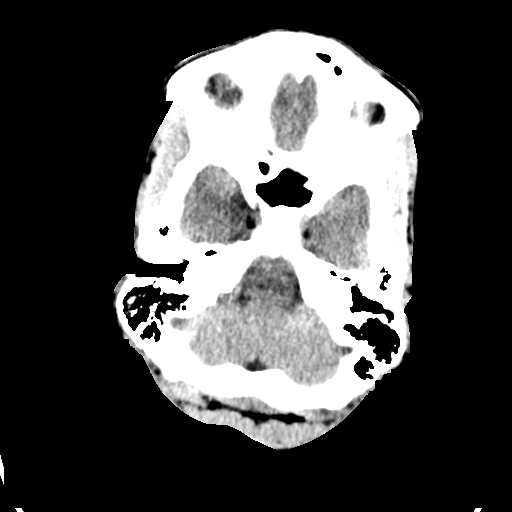
[im 10/32  brain]
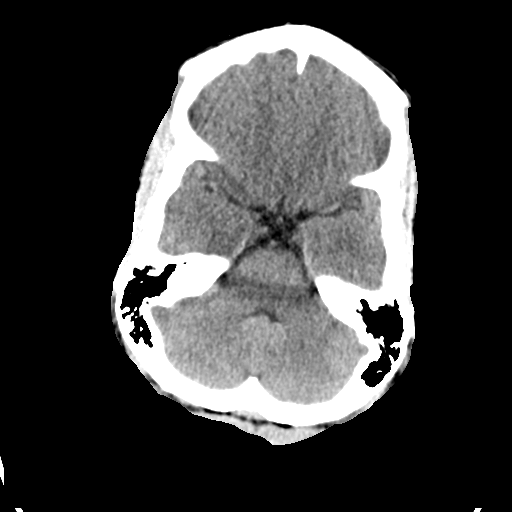
[im 10/32  bone]
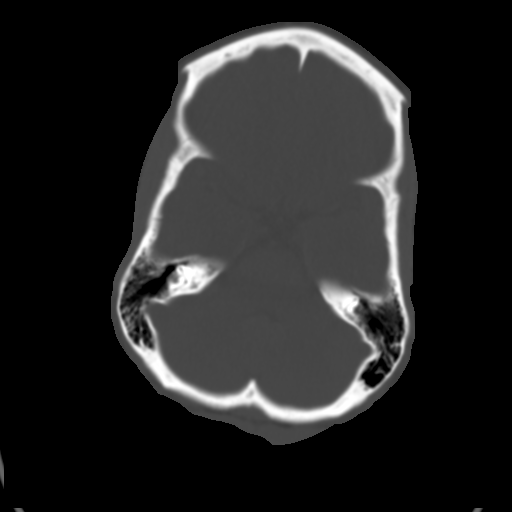
[im 11/32  brain]
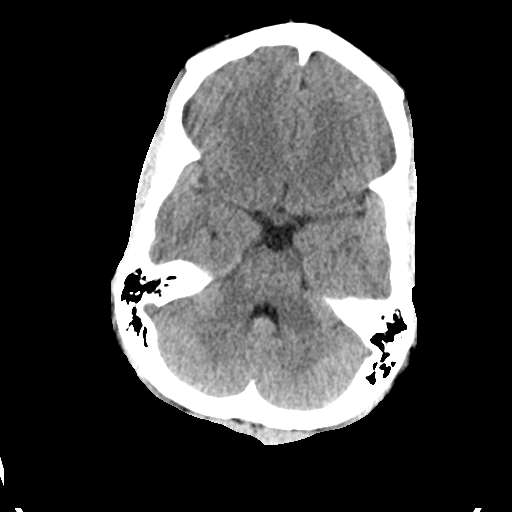
[im 13/32  brain]
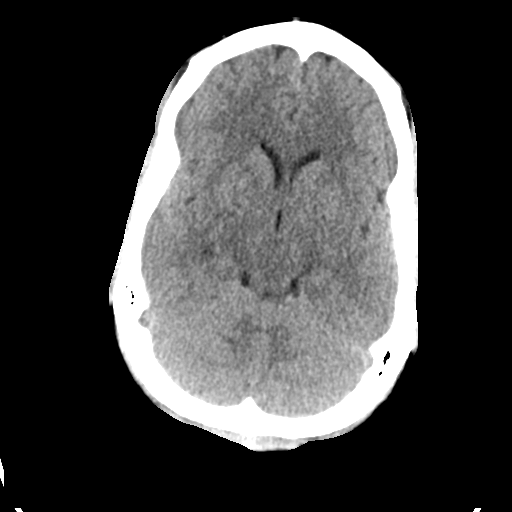
[im 15/32  brain]
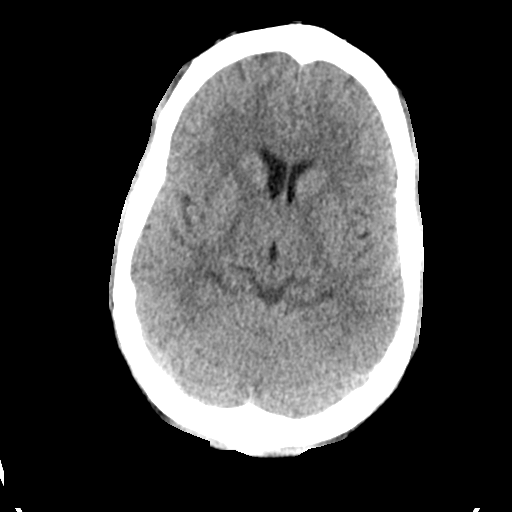
[im 17/32  brain]
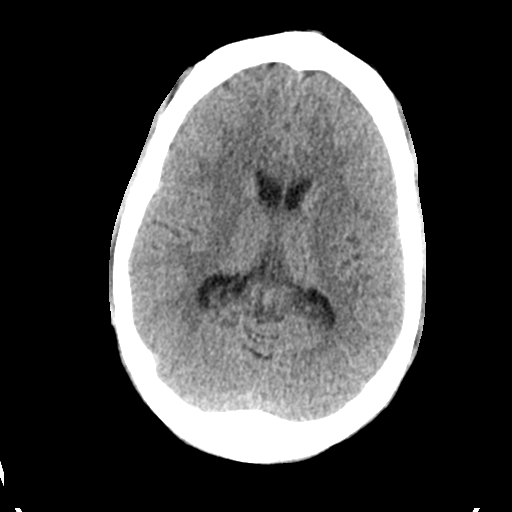
[im 17/32  bone]
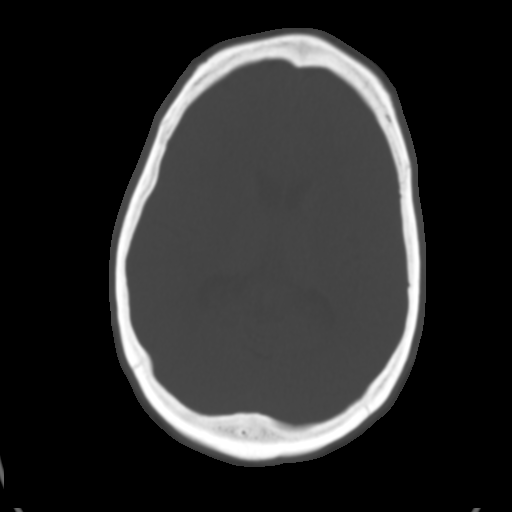
[im 19/32  brain]
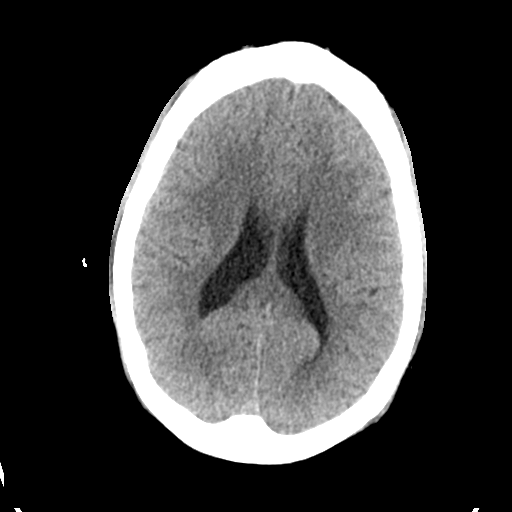
[im 21/32  brain]
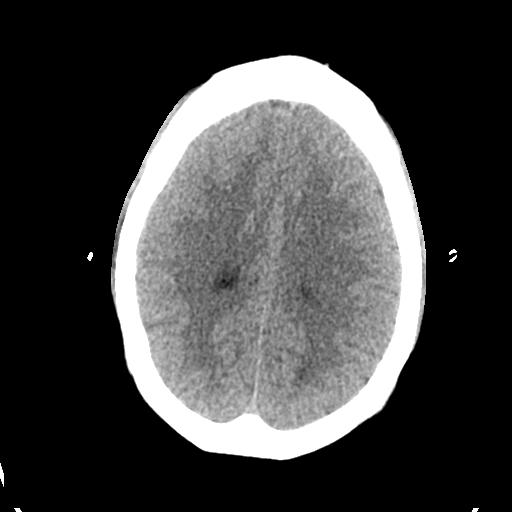
[im 22/32  brain]
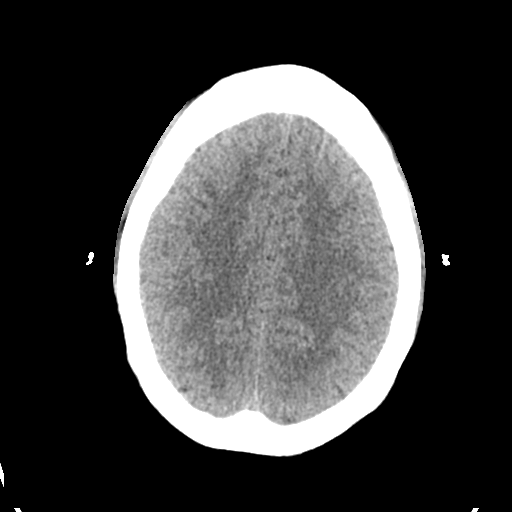
[im 24/32  brain]
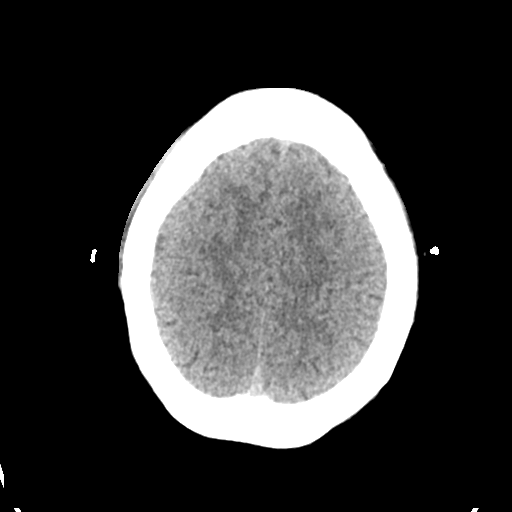
[im 24/32  bone]
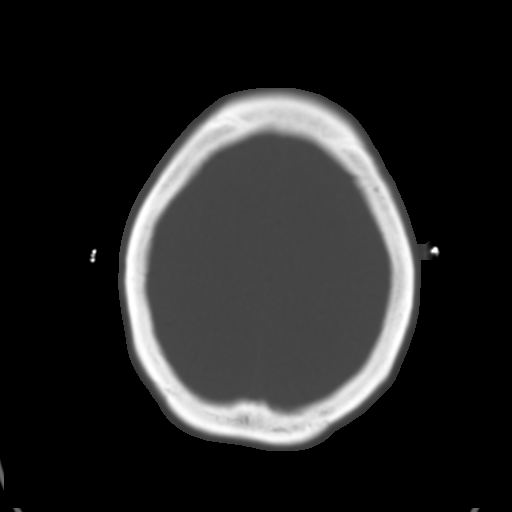
[im 26/32  brain]
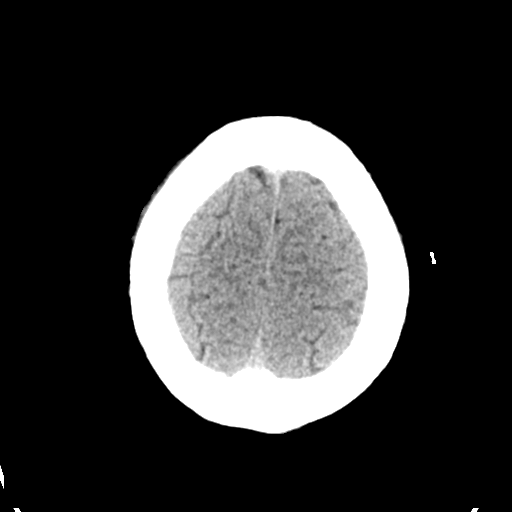
[im 28/32  brain]
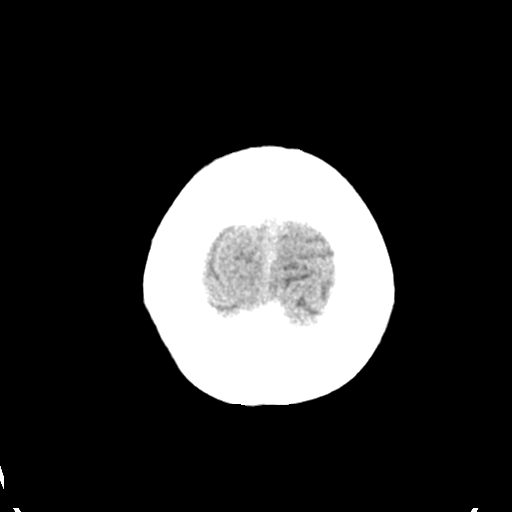
[im 30/32  brain]
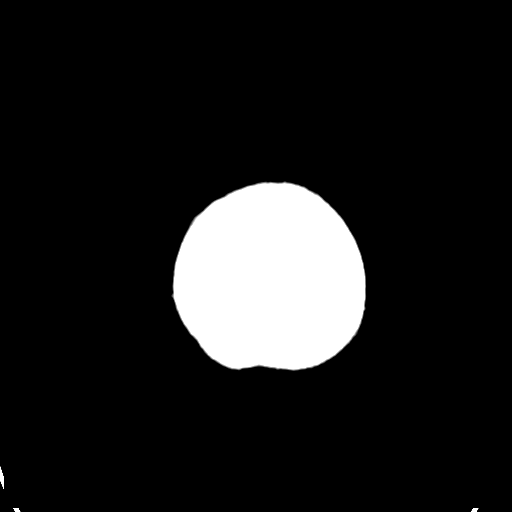

[16 of 30 positions shown; findings below may reference images not displayed]

FINDINGS: The brain demonstrates no evidence of hemorrhage, infarction, edema,
mass effect, extra-axial fluid collection, hydrocephalus or mass
lesion. The skull is unremarkable.
IMPRESSION: Normal head CT.

## 2017-06-26 ENCOUNTER — Emergency Department (HOSPITAL_COMMUNITY): Payer: Medicaid Other

## 2017-06-26 ENCOUNTER — Emergency Department (HOSPITAL_COMMUNITY)
Admission: EM | Admit: 2017-06-26 | Discharge: 2017-06-27 | Disposition: A | Discharge status: Home / Self Care | Payer: Medicaid Other | Attending: Emergency Medicine | Admitting: Emergency Medicine

## 2017-06-26 ENCOUNTER — Encounter (HOSPITAL_COMMUNITY): Payer: Self-pay | Admitting: *Deleted

## 2017-06-26 DIAGNOSIS — J45909 Unspecified asthma, uncomplicated: Secondary | ICD-10-CM | POA: Diagnosis not present

## 2017-06-26 DIAGNOSIS — F17210 Nicotine dependence, cigarettes, uncomplicated: Secondary | ICD-10-CM | POA: Diagnosis not present

## 2017-06-26 DIAGNOSIS — Z23 Encounter for immunization: Secondary | ICD-10-CM | POA: Insufficient documentation

## 2017-06-26 DIAGNOSIS — Y939 Activity, unspecified: Secondary | ICD-10-CM | POA: Insufficient documentation

## 2017-06-26 DIAGNOSIS — S99922A Unspecified injury of left foot, initial encounter: Secondary | ICD-10-CM | POA: Insufficient documentation

## 2017-06-26 DIAGNOSIS — I10 Essential (primary) hypertension: Secondary | ICD-10-CM | POA: Diagnosis not present

## 2017-06-26 DIAGNOSIS — Y929 Unspecified place or not applicable: Secondary | ICD-10-CM | POA: Diagnosis not present

## 2017-06-26 DIAGNOSIS — F1410 Cocaine abuse, uncomplicated: Secondary | ICD-10-CM

## 2017-06-26 DIAGNOSIS — T189XXA Foreign body of alimentary tract, part unspecified, initial encounter: Secondary | ICD-10-CM

## 2017-06-26 DIAGNOSIS — S99921A Unspecified injury of right foot, initial encounter: Secondary | ICD-10-CM

## 2017-06-26 DIAGNOSIS — S0990XA Unspecified injury of head, initial encounter: Secondary | ICD-10-CM | POA: Diagnosis not present

## 2017-06-26 DIAGNOSIS — Z79899 Other long term (current) drug therapy: Secondary | ICD-10-CM | POA: Diagnosis not present

## 2017-06-26 DIAGNOSIS — M542 Cervicalgia: Secondary | ICD-10-CM | POA: Insufficient documentation

## 2017-06-26 DIAGNOSIS — R0789 Other chest pain: Secondary | ICD-10-CM | POA: Insufficient documentation

## 2017-06-26 DIAGNOSIS — Y998 Other external cause status: Secondary | ICD-10-CM | POA: Diagnosis not present

## 2017-06-26 DIAGNOSIS — R0781 Pleurodynia: Secondary | ICD-10-CM

## 2017-06-26 MED ORDER — TETANUS-DIPHTH-ACELL PERTUSSIS 5-2.5-18.5 LF-MCG/0.5 IM SUSP
0.5000 mL | Freq: Once | INTRAMUSCULAR | Status: AC
  Administered 2017-06-26: 0.5 mL via INTRAMUSCULAR
  Filled 2017-06-26: qty 0.5

## 2017-06-26 MED ORDER — ACETAMINOPHEN 325 MG PO TABS
650.0000 mg | ORAL_TABLET | Freq: Once | ORAL | Status: DC
Start: 2017-06-27 — End: 2017-06-27

## 2017-06-26 NOTE — ED Notes (Signed)
Patient transported to X-ray 

## 2017-06-26 NOTE — ED Notes (Signed)
Social work at bedside.  
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
## 2017-06-27 ENCOUNTER — Emergency Department (HOSPITAL_COMMUNITY): Payer: Medicaid Other

## 2017-06-27 LAB — BASIC METABOLIC PANEL
ANION GAP: 9 (ref 5–15)
BUN: 9 mg/dL (ref 6–20)
CALCIUM: 8.7 mg/dL — AB (ref 8.9–10.3)
CO2: 24 mmol/L (ref 22–32)
Chloride: 109 mmol/L (ref 101–111)
Creatinine, Ser: 1.02 mg/dL — ABNORMAL HIGH (ref 0.44–1.00)
GFR calc Af Amer: >60 mL/min (ref >60)
GLUCOSE: 97 mg/dL (ref 65–99)
Potassium: 3.4 mmol/L — ABNORMAL LOW (ref 3.5–5.1)
Sodium: 142 mmol/L (ref 135–145)

## 2017-06-27 LAB — CBC
HCT: 39.9 % (ref 36.0–46.0)
Hemoglobin: 12.4 g/dL (ref 12.0–15.0)
MCH: 28.4 pg (ref 26.0–34.0)
MCHC: 31.1 g/dL (ref 30.0–36.0)
MCV: 91.3 fL (ref 78.0–100.0)
PLATELETS: 216 10*3/uL (ref 150–400)
RBC: 4.37 MIL/uL (ref 3.87–5.11)
RDW: 13.2 % (ref 11.5–15.5)
WBC: 6.3 10*3/uL (ref 4.0–10.5)

## 2017-06-27 LAB — RAPID URINE DRUG SCREEN, HOSP PERFORMED
Amphetamines: NOT DETECTED
BENZODIAZEPINES: NOT DETECTED
Barbiturates: NOT DETECTED
COCAINE: POSITIVE — AB
OPIATES: NOT DETECTED
Tetrahydrocannabinol: POSITIVE — AB

## 2017-06-27 LAB — PREGNANCY, URINE: Preg Test, Ur: NEGATIVE

## 2017-06-27 LAB — ACETAMINOPHEN LEVEL: Acetaminophen (Tylenol), Serum: <10 ug/mL — ABNORMAL LOW (ref 10–30)

## 2017-06-27 LAB — ETHANOL: Alcohol, Ethyl (B): <10 mg/dL (ref <10)

## 2017-06-27 LAB — SALICYLATE LEVEL: Salicylate Lvl: <7 mg/dL (ref 2.8–30.0)

## 2017-06-27 MED ORDER — SODIUM CHLORIDE 0.9 % IV BOLUS
1000.0000 mL | Freq: Once | INTRAVENOUS | Status: AC
Start: 2017-06-27 — End: 2017-06-27
  Administered 2017-06-27: 1000 mL via INTRAVENOUS

## 2017-06-27 MED ORDER — THIAMINE HCL 100 MG/ML IJ SOLN
100.0000 mg | Freq: Once | INTRAMUSCULAR | Status: AC
  Administered 2017-06-27: 100 mg via INTRAVENOUS
  Filled 2017-06-27: qty 2
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
## 2017-06-27 NOTE — Progress Notes (Signed)
Orthopedic Tech Progress Note Patient Details:  [name] [birth year] [id number]  Ortho Devices Type of Ortho Device: ASO Ortho Device/Splint Location: lle Ortho Device/Splint Interventions: Ordered, Application, Adjustment   Post Interventions Patient Tolerated: Well Instructions Provided: Care of device, Adjustment of device   [name] Post 06/27/2017, 4:24 AM
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
## 2017-06-27 NOTE — ED Notes (Signed)
Patient transported to CT 

## 2017-06-27 NOTE — ED Notes (Signed)
Ambulated patient, no complaints

## 2018-03-29 ENCOUNTER — Emergency Department (HOSPITAL_COMMUNITY)
Admission: EM | Admit: 2018-03-29 | Discharge: 2018-03-29 | Disposition: A | Discharge status: Home / Self Care | Payer: Medicaid Other | Attending: Emergency Medicine | Admitting: Emergency Medicine

## 2018-03-29 ENCOUNTER — Emergency Department (HOSPITAL_COMMUNITY): Payer: Medicaid Other

## 2018-03-29 ENCOUNTER — Other Ambulatory Visit: Payer: Self-pay

## 2018-03-29 ENCOUNTER — Encounter (HOSPITAL_COMMUNITY): Payer: Self-pay | Admitting: Emergency Medicine

## 2018-03-29 DIAGNOSIS — Z79899 Other long term (current) drug therapy: Secondary | ICD-10-CM | POA: Insufficient documentation

## 2018-03-29 DIAGNOSIS — J45909 Unspecified asthma, uncomplicated: Secondary | ICD-10-CM | POA: Diagnosis not present

## 2018-03-29 DIAGNOSIS — R51 Headache: Secondary | ICD-10-CM | POA: Diagnosis not present

## 2018-03-29 DIAGNOSIS — I10 Essential (primary) hypertension: Secondary | ICD-10-CM | POA: Insufficient documentation

## 2018-03-29 DIAGNOSIS — F17210 Nicotine dependence, cigarettes, uncomplicated: Secondary | ICD-10-CM | POA: Insufficient documentation

## 2018-03-29 DIAGNOSIS — H2000 Unspecified acute and subacute iridocyclitis: Secondary | ICD-10-CM | POA: Insufficient documentation

## 2018-03-29 DIAGNOSIS — H209 Unspecified iridocyclitis: Secondary | ICD-10-CM

## 2018-03-29 DIAGNOSIS — H547 Unspecified visual loss: Secondary | ICD-10-CM | POA: Diagnosis present

## 2018-03-29 MED ORDER — FLUORESCEIN SODIUM 1 MG OP STRP
1.0000 | ORAL_STRIP | Freq: Once | OPHTHALMIC | Status: AC
  Administered 2018-03-29: 1 via OPHTHALMIC
  Filled 2018-03-29: qty 1

## 2018-03-29 MED ORDER — PREDNISOLONE ACETATE 1 % OP SUSP
1.0000 [drp] | Freq: Four times a day (QID) | OPHTHALMIC | Status: DC
  Administered 2018-03-29: 1 [drp] via OPHTHALMIC
  Filled 2018-03-29 (×2): qty 5

## 2018-03-29 MED ORDER — FENTANYL CITRATE (PF) 100 MCG/2ML IJ SOLN
50.0000 ug | Freq: Once | INTRAMUSCULAR | Status: AC
  Administered 2018-03-29: 50 ug via INTRAVENOUS
  Filled 2018-03-29: qty 2

## 2018-03-29 MED ORDER — PREDNISOLONE ACETATE 1 % OP SUSP: 1.0000 [drp] | Freq: Four times a day (QID) | OPHTHALMIC | 0 refills | Status: AC

## 2018-03-29 MED ORDER — TETRACAINE HCL 0.5 % OP SOLN
2.0000 [drp] | Freq: Once | OPHTHALMIC | Status: AC
  Administered 2018-03-29: 2 [drp] via OPHTHALMIC
  Filled 2018-03-29: qty 4

## 2018-03-29 MED ORDER — CYCLOPENTOLATE HCL 1 % OP SOLN
1.0000 [drp] | Freq: Once | OPHTHALMIC | Status: AC
  Administered 2018-03-29: 1 [drp] via OPHTHALMIC
  Filled 2018-03-29: qty 2

## 2018-03-29 NOTE — ED Notes (Signed)
Patient verbalizes understanding of discharge instructions. Opportunity for questioning and answers were provided. Armband removed by staff, pt discharged from ED ambulatory w/ cab voucher to [facility] safe house.

## 2018-03-29 NOTE — Discharge Instructions (Addendum)
Please follow up with Dr. Sherryll Burger (eye doctor) tomorrow for recheck Use the cyclopentalate eye drop (red top) three times a day Use the pred forte drop four times a day Return if you are worsening

## 2018-03-29 NOTE — Care Management (Signed)
CSW Catelin at bedside speaking with Patient.
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
## 2018-03-29 NOTE — ED Notes (Addendum)
Pt returned from CT °
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
## 2018-03-29 NOTE — ED Notes (Signed)
ED Provider at bedside. 

## 2018-07-11 ENCOUNTER — Other Ambulatory Visit: Payer: Self-pay

## 2018-07-11 ENCOUNTER — Emergency Department
Admission: EM | Admit: 2018-07-11 | Discharge: 2018-07-11 | Disposition: A | Discharge status: Home / Self Care | Payer: Medicaid Other | Attending: Emergency Medicine | Admitting: Emergency Medicine

## 2018-07-11 DIAGNOSIS — Z20828 Contact with and (suspected) exposure to other viral communicable diseases: Secondary | ICD-10-CM | POA: Insufficient documentation

## 2018-07-11 DIAGNOSIS — R197 Diarrhea, unspecified: Secondary | ICD-10-CM | POA: Insufficient documentation

## 2018-07-11 DIAGNOSIS — I10 Essential (primary) hypertension: Secondary | ICD-10-CM | POA: Insufficient documentation

## 2018-07-11 DIAGNOSIS — J45909 Unspecified asthma, uncomplicated: Secondary | ICD-10-CM | POA: Diagnosis not present

## 2018-07-11 DIAGNOSIS — R112 Nausea with vomiting, unspecified: Secondary | ICD-10-CM | POA: Diagnosis not present

## 2018-07-11 DIAGNOSIS — Z79899 Other long term (current) drug therapy: Secondary | ICD-10-CM | POA: Insufficient documentation

## 2018-07-11 DIAGNOSIS — F17210 Nicotine dependence, cigarettes, uncomplicated: Secondary | ICD-10-CM | POA: Insufficient documentation

## 2018-07-11 DIAGNOSIS — N390 Urinary tract infection, site not specified: Secondary | ICD-10-CM

## 2018-07-11 LAB — CBC
HCT: 35.9 % — ABNORMAL LOW (ref 36.0–46.0)
Hemoglobin: 11.9 g/dL — ABNORMAL LOW (ref 12.0–15.0)
MCH: 30.9 pg (ref 26.0–34.0)
MCHC: 33.1 g/dL (ref 30.0–36.0)
MCV: 93.2 fL (ref 80.0–100.0)
Platelets: 198 10*3/uL (ref 150–400)
RBC: 3.85 MIL/uL — ABNORMAL LOW (ref 3.87–5.11)
RDW: 12.6 % (ref 11.5–15.5)
WBC: 6.7 10*3/uL (ref 4.0–10.5)
nRBC: 0 % (ref 0.0–0.2)

## 2018-07-11 LAB — URINALYSIS, COMPLETE (UACMP) WITH MICROSCOPIC
Bacteria, UA: NONE SEEN
Bilirubin Urine: NEGATIVE
Glucose, UA: NEGATIVE mg/dL
Hgb urine dipstick: NEGATIVE
Ketones, ur: NEGATIVE mg/dL
Nitrite: NEGATIVE
Protein, ur: NEGATIVE mg/dL
Specific Gravity, Urine: 1.008 (ref 1.005–1.030)
pH: 5 (ref 5.0–8.0)

## 2018-07-11 LAB — COMPREHENSIVE METABOLIC PANEL
ALT: 14 U/L (ref 0–44)
AST: 15 U/L (ref 15–41)
Albumin: 4.5 g/dL (ref 3.5–5.0)
Alkaline Phosphatase: 42 U/L (ref 38–126)
Anion gap: 7 (ref 5–15)
BUN: 9 mg/dL (ref 6–20)
CO2: 26 mmol/L (ref 22–32)
Calcium: 8.7 mg/dL — ABNORMAL LOW (ref 8.9–10.3)
Chloride: 105 mmol/L (ref 98–111)
Creatinine, Ser: 0.76 mg/dL (ref 0.44–1.00)
GFR calc Af Amer: >60 mL/min (ref >60)
GFR calc non Af Amer: >60 mL/min (ref >60)
Glucose, Bld: 87 mg/dL (ref 70–99)
Potassium: 3.2 mmol/L — ABNORMAL LOW (ref 3.5–5.1)
Sodium: 138 mmol/L (ref 135–145)
Total Bilirubin: 0.7 mg/dL (ref 0.3–1.2)
Total Protein: 8 g/dL (ref 6.5–8.1)

## 2018-07-11 LAB — LIPASE, BLOOD: Lipase: 44 U/L (ref 11–51)

## 2018-07-11 LAB — POCT PREGNANCY, URINE: Preg Test, Ur: NEGATIVE

## 2018-07-11 LAB — SARS CORONAVIRUS 2 BY RT PCR (HOSPITAL ORDER, PERFORMED IN ~~LOC~~ HOSPITAL LAB): SARS Coronavirus 2: NEGATIVE

## 2018-07-11 MED ORDER — ONDANSETRON HCL 4 MG/2ML IJ SOLN
4.0000 mg | Freq: Once | INTRAMUSCULAR | Status: AC
  Administered 2018-07-11: 05:00:00 4 mg via INTRAVENOUS
  Filled 2018-07-11: qty 2

## 2018-07-11 MED ORDER — ONDANSETRON 4 MG PO TBDP: 4.0000 mg | ORAL_TABLET | Freq: Three times a day (TID) | ORAL | 0 refills | Status: DC | PRN

## 2018-07-11 MED ORDER — SODIUM CHLORIDE 0.9 % IV SOLN
1.0000 g | Freq: Once | INTRAVENOUS | Status: AC
  Administered 2018-07-11: 1 g via INTRAVENOUS
  Filled 2018-07-11: qty 10

## 2018-07-11 MED ORDER — SODIUM CHLORIDE 0.9 % IV BOLUS
1000.0000 mL | Freq: Once | INTRAVENOUS | Status: AC
  Administered 2018-07-11: 1000 mL via INTRAVENOUS

## 2018-07-11 MED ORDER — CEPHALEXIN 500 MG PO CAPS: 500.0000 mg | ORAL_CAPSULE | Freq: Three times a day (TID) | ORAL | 0 refills | Status: DC
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
## 2018-07-11 NOTE — ED Notes (Signed)
Patient to nurses station to request work note and discharge papers. This RN called primary RN and was informed that discharge papers and work note had not been written yet as the MD was still in a procedure. This RN informed patient. Patient agitated, but verbalized understanding and went back to room.
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
## 2018-07-11 NOTE — ED Provider Notes (Signed)
[facility] Emergency Department Provider Note   ____________________________________________   First MD Initiated Contact with Patient 07/11/18 [phone]     (approximate)  I have reviewed the triage vital signs and the nursing notes.   HISTORY  Chief Complaint Emesis and Nausea    HPI Nicole Mahoney is a 38 y.o. female who presents to the ED with a chief complaint of nausea, vomiting and diarrhea.  Patient reports eating a hamburger from [employer] and subsequently experiencing symptoms.  Also states there was a known COVID positive case at the sheets and she is concerned she may have coronavirus.  Denies fever, cough, chest pain, shortness of breath, abdominal pain, dysuria.       Past Medical History:  Diagnosis Date  . Asthma   . Bronchitis, acute   . Depression affecting pregnancy, antepartum   . Drug abuse, cocaine type (HCC)   . Drug abuse, marijuana   . Hypertension     Patient Active Problem List   Diagnosis Date Noted  . Preterm delivery 10/11/2014  . Penicillin allergy 09/17/2014  . Marijuana use 07/22/2014  . Cocaine abuse affecting pregnancy, antepartum (HCC) 07/18/2014  . Depression affecting pregnancy, antepartum 07/18/2014  . 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 MDD (major depressive disorder) 03/12/2014  . 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 Cocaine abuse [facility](HCC)     Past Surgical History:  Procedure Laterality Date  . APPENDECTOMY    . HAND SURGERY      Prior to Admission medications   Medication Sig Start Date End Date Taking? Authorizing Provider  acetaminophen (TYLENOL) 650 MG CR tablet Take 650 mg by mouth every 8 (eight) hours as needed for pain.    [provider]  cephALEXin (KEFLEX) 500 MG capsule Take 1 capsule (500 mg total) by mouth 3 (three) times daily. 07/11/18   Irean HongSung,  J, MD  ondansetron (ZOFRAN ODT) 4 MG disintegrating tablet Take 1 tablet (4 mg total) by  mouth every 8 (eight) hours as needed for nausea or vomiting. 07/11/18   Irean HongSung,  J, MD  prednisoLONE acetate (PRED FORTE) 1 % ophthalmic suspension Place 1 drop into the left eye 4 (four) times daily. 03/29/18   Bethel BornGekas, Kelly Marie, PA-C    Allergies Penicillins and Percocet [oxycodone-acetaminophen]  Family History  Problem Relation Age of Onset  . Cancer Other   . Asthma Mother   . Asthma Father   . Depression Father   . Depression Sister   . Depression Brother   . Diabetes Paternal Grandmother     Social History Social History   Tobacco Use  . Smoking status: Current Every Day Smoker    Packs/day: 0.50    Types: Cigarettes  . Smokeless tobacco: Never Used  Substance Use Topics  . Alcohol use: Yes    Comment: "occasional drinks"  . Drug use: Yes    Types: Marijuana    Comment: positive UDS for cocaine & MJ 12/11/13; states she uses cocaine on Mondays    Review of Systems  Constitutional: No fever/chills Eyes: No visual changes. ENT: No sore throat. Cardiovascular: Denies chest pain. Respiratory: Denies shortness of breath. Gastrointestinal: No abdominal pain.  Positive for nausea, vomiting and diarrhea.  No constipation. Genitourinary: Negative for dysuria. Musculoskeletal: Negative for back pain. Skin: Negative for rash. Neurological: Negative for headaches, focal weakness or numbness.   ____________________________________________   PHYSICAL EXAM:  VITAL SIGNS: ED Triage Vitals  Enc Vitals Group  BP 07/11/18 0344 136/81     Pulse Rate 07/11/18 0344 76     Resp 07/11/18 0344 20     Temp 07/11/18 0344 98.5 F (36.9 C)     Temp Source 07/11/18 0344 Oral     SpO2 07/11/18 0344 100 %     Weight 07/11/18 0342 200 lb (90.7 kg)     Height 07/11/18 0342 5\' 9"  (1.753 m)     Head Circumference --      Peak Flow --      Pain Score 07/11/18 0342 0     Pain Loc --      Pain Edu? --      Excl. in [place]? --     Constitutional: Alert and oriented. Well  appearing and in no acute distress. Eyes: Conjunctivae are normal. PERRL. EOMI. Head: Atraumatic. Nose: No congestion/rhinnorhea. Mouth/Throat: Mucous membranes are moist.  Oropharynx non-erythematous. Neck: No stridor.   Cardiovascular: Normal rate, regular rhythm. Grossly normal heart sounds.  Good peripheral circulation. Respiratory: Normal respiratory effort.  No retractions. Lungs CTAB. Gastrointestinal: Soft and nontender to light or deep palpation. No distention. No abdominal bruits. No CVA tenderness. Musculoskeletal: No lower extremity tenderness nor edema.  No joint effusions. Neurologic:  Normal speech and language. No gross focal neurologic deficits are appreciated. No gait instability. Skin:  Skin is warm, dry and intact. No rash noted. Psychiatric: Mood and affect are normal. Speech and behavior are normal.  ____________________________________________   LABS (all labs ordered are listed, but only abnormal results are displayed)  Labs Reviewed  COMPREHENSIVE METABOLIC PANEL - Abnormal; Notable for the following components:      Result Value   Potassium 3.2 (*)    Calcium 8.7 (*)    All other components within normal limits  CBC - Abnormal; Notable for the following components:   RBC 3.85 (*)    Hemoglobin 11.9 (*)    HCT 35.9 (*)    All other components within normal limits  URINALYSIS, COMPLETE (UACMP) WITH MICROSCOPIC - Abnormal; Notable for the following components:   Color, Urine YELLOW (*)    APPearance CLEAR (*)    Leukocytes,Ua SMALL (*)    All other components within normal limits  SARS CORONAVIRUS 2 (HOSPITAL ORDER, [place] LAB)  LIPASE, BLOOD  POC URINE PREG, ED  POCT PREGNANCY, URINE   ____________________________________________  EKG  None ____________________________________________  RADIOLOGY  ED MD interpretation: None  Official radiology report(s): No results found.   ____________________________________________   PROCEDURES  Procedure(s) performed (including Critical Care):  Procedures   ____________________________________________   INITIAL IMPRESSION / ASSESSMENT AND PLAN / ED COURSE  As part of my medical decision making, I reviewed the following data within the [place] notes reviewed and incorporated, Labs reviewed, Old chart reviewed and Notes from prior ED visits     Nicole Mahoney was evaluated in Emergency Department on 07/11/2018 for the symptoms described in the history of present illness. She was evaluated in the context of the global COVID-19 pandemic, which necessitated consideration that the patient might be at risk for infection with the SARS-CoV-2 virus that causes COVID-19. Institutional protocols and algorithms that pertain to the evaluation of patients at risk for COVID-19 are in a state of rapid change based on information released by regulatory bodies including the CDC and federal and state organizations. These policies and algorithms were followed during the patient's care in the ED.   38 year old female who presents  with nausea, vomiting, diarrhea and concerns for COVID exposure. Differential diagnosis includes, but is not limited to, ovarian cyst, ovarian torsion, acute appendicitis, diverticulitis, urinary tract infection/pyelonephritis, endometriosis, bowel obstruction, colitis, renal colic, gastroenteritis, hernia, fibroids, endometriosis, pregnancy related pain including ectopic pregnancy, etc.  Laboratory results remarkable for mild UTI.  Will infuse IV fluids, Zofran, Rocephin and reassess.  Will check COVID.  Clinical Course as of Jul 10 641  Sat Jul 11, 2018  [id number] Patient eloped while I was in a sterile procedure.  Reportedly she had to breast-feed her 563-week-old baby.  I will print out a prescription for Keflex and Zofran along with her discharge instructions and leave at the front desk for  her to pick up later this morning.   [JS]    Clinical Course User Index [JS] Irean HongSung,  J, MD     ____________________________________________   FINAL CLINICAL IMPRESSION(S) / ED DIAGNOSES  Final diagnoses:  Nausea vomiting and diarrhea     ED Discharge Orders         Ordered    cephALEXin (KEFLEX) 500 MG capsule  3 times daily     07/11/18 0450    ondansetron (ZOFRAN ODT) 4 MG disintegrating tablet  Every 8 hours PRN     07/11/18 0450           Note:  This document was prepared using Dragon voice recognition software and may include unintentional dictation errors.   Irean HongSung,  J, MD 07/11/18 [phone]

## 2018-07-11 NOTE — ED Notes (Signed)
Patient back to nurses station to again ask for work note and discharge instructions. Charge RN went to speak with patient with this RN as witness. Patient informed this RN and charge RN that she had to go home to breast feed her 32-week old. Charge RN informed the patient that she could leave and that her discharge instructions and work note would be at the front desk for her to pick up later this morning. Patient was satisfied with this option. Patient left and reported she would come back and pick up/review discharge instructions later.

## 2018-07-11 NOTE — ED Triage Notes (Signed)
Patient reports symptoms began Friday morning.  Reports nausea and vomiting.

## 2018-07-11 NOTE — Discharge Instructions (Addendum)
1.  Take antibiotic as prescribed (Keflex). 2.  You may take Zofran as needed for nausea/vomiting. 3.  Clear liquids x12 hours, then [employer] x3 days, then slowly advance diet as tolerated. 4.  Return to the ER for worsening symptoms, persistent vomiting, difficulty breathing or other concerns.

## 2019-08-26 ENCOUNTER — Encounter (HOSPITAL_COMMUNITY): Payer: Self-pay

## 2019-08-26 ENCOUNTER — Emergency Department (HOSPITAL_COMMUNITY): Payer: Medicaid Other

## 2019-08-26 ENCOUNTER — Emergency Department (HOSPITAL_COMMUNITY)
Admission: EM | Admit: 2019-08-26 | Discharge: 2019-08-26 | Disposition: A | Discharge status: Home / Self Care | Payer: Medicaid Other | Attending: Emergency Medicine | Admitting: Emergency Medicine

## 2019-08-26 DIAGNOSIS — I10 Essential (primary) hypertension: Secondary | ICD-10-CM | POA: Diagnosis not present

## 2019-08-26 DIAGNOSIS — Z20822 Contact with and (suspected) exposure to covid-19: Secondary | ICD-10-CM | POA: Insufficient documentation

## 2019-08-26 DIAGNOSIS — F17210 Nicotine dependence, cigarettes, uncomplicated: Secondary | ICD-10-CM | POA: Insufficient documentation

## 2019-08-26 DIAGNOSIS — J4521 Mild intermittent asthma with (acute) exacerbation: Secondary | ICD-10-CM | POA: Diagnosis not present

## 2019-08-26 DIAGNOSIS — R0602 Shortness of breath: Secondary | ICD-10-CM | POA: Diagnosis present

## 2019-08-26 LAB — CBC WITH DIFFERENTIAL/PLATELET
Abs Immature Granulocytes: 0.03 10*3/uL (ref 0.00–0.07)
Basophils Absolute: 0 10*3/uL (ref 0.0–0.1)
Basophils Relative: 0 %
Eosinophils Absolute: 0.1 10*3/uL (ref 0.0–0.5)
Eosinophils Relative: 1 %
HCT: 39.9 % (ref 36.0–46.0)
Hemoglobin: 12.7 g/dL (ref 12.0–15.0)
Immature Granulocytes: 0 %
Lymphocytes Relative: 13 %
Lymphs Abs: 1.2 10*3/uL (ref 0.7–4.0)
MCH: 31.1 pg (ref 26.0–34.0)
MCHC: 31.8 g/dL (ref 30.0–36.0)
MCV: 97.6 fL (ref 80.0–100.0)
Monocytes Absolute: 0.4 10*3/uL (ref 0.1–1.0)
Monocytes Relative: 5 %
Neutro Abs: 7.5 10*3/uL (ref 1.7–7.7)
Neutrophils Relative %: 81 %
Platelets: 191 10*3/uL (ref 150–400)
RBC: 4.09 MIL/uL (ref 3.87–5.11)
RDW: 12.8 % (ref 11.5–15.5)
WBC: 9.3 10*3/uL (ref 4.0–10.5)
nRBC: 0 % (ref 0.0–0.2)

## 2019-08-26 LAB — COMPREHENSIVE METABOLIC PANEL
ALT: 16 U/L (ref 0–44)
AST: 17 U/L (ref 15–41)
Albumin: 4 g/dL (ref 3.5–5.0)
Alkaline Phosphatase: 41 U/L (ref 38–126)
Anion gap: 9 (ref 5–15)
BUN: 6 mg/dL (ref 6–20)
CO2: 23 mmol/L (ref 22–32)
Calcium: 8.6 mg/dL — ABNORMAL LOW (ref 8.9–10.3)
Chloride: 109 mmol/L (ref 98–111)
Creatinine, Ser: 0.81 mg/dL (ref 0.44–1.00)
GFR calc Af Amer: >60 mL/min (ref >60)
GFR calc non Af Amer: >60 mL/min (ref >60)
Glucose, Bld: 111 mg/dL — ABNORMAL HIGH (ref 70–99)
Potassium: 3 mmol/L — ABNORMAL LOW (ref 3.5–5.1)
Sodium: 141 mmol/L (ref 135–145)
Total Bilirubin: 0.7 mg/dL (ref 0.3–1.2)
Total Protein: 7.2 g/dL (ref 6.5–8.1)

## 2019-08-26 LAB — SARS CORONAVIRUS 2 BY RT PCR (HOSPITAL ORDER, PERFORMED IN ~~LOC~~ HOSPITAL LAB): SARS Coronavirus 2: NEGATIVE

## 2019-08-26 MED ORDER — HYDROCODONE-ACETAMINOPHEN 5-325 MG PO TABS
1.0000 | ORAL_TABLET | Freq: Once | ORAL | Status: AC
  Administered 2019-08-26: 1 via ORAL
  Filled 2019-08-26: qty 1

## 2019-08-26 MED ORDER — ALBUTEROL SULFATE HFA 108 (90 BASE) MCG/ACT IN AERS
2.0000 | INHALATION_SPRAY | RESPIRATORY_TRACT | Status: DC | PRN
  Administered 2019-08-26: 2 via RESPIRATORY_TRACT
  Filled 2019-08-26: qty 6.7

## 2019-08-26 MED ORDER — POTASSIUM CHLORIDE CRYS ER 20 MEQ PO TBCR
40.0000 meq | EXTENDED_RELEASE_TABLET | Freq: Once | ORAL | Status: AC
  Administered 2019-08-26: 40 meq via ORAL
  Filled 2019-08-26: qty 2

## 2019-08-26 MED ORDER — DOXYCYCLINE HYCLATE 100 MG PO CAPS: 100.0000 mg | ORAL_CAPSULE | Freq: Two times a day (BID) | ORAL | 0 refills | Status: DC

## 2019-08-26 MED ORDER — ONDANSETRON 4 MG PO TBDP
4.0000 mg | ORAL_TABLET | Freq: Once | ORAL | Status: AC
  Administered 2019-08-26: 4 mg via ORAL
  Filled 2019-08-26: qty 1

## 2019-08-26 NOTE — ED Triage Notes (Signed)
She c/o "asthma attack" today. She rec'd. Albuterol/atroven neg and 125mg  of Solu Medrol IV en route to hospital.

## 2019-08-26 NOTE — Discharge Instructions (Addendum)
Use your inhaler 2 puffs every 4-6 hours for shortness of breath and follow-up with a primary care doctor

## 2019-08-26 NOTE — ED Provider Notes (Signed)
[facility]-EMERGENCY DEPT Provider Note   CSN: [id number] Arrival date & time: 08/26/19  9211     History Chief Complaint  Patient presents with  . Asthma    Nicole Mahoney is a 39 y.o. female.  Patient has a history of asthma complains of shortness of breath and cough  The history is provided by the patient. No language interpreter was used.  Asthma This is a recurrent problem. The current episode started 2 days ago. The problem occurs constantly. The problem has not changed since onset.Associated symptoms include shortness of breath. Pertinent negatives include no chest pain, no abdominal pain and no headaches. Nothing aggravates the symptoms. Nothing relieves the symptoms. She has tried nothing for the symptoms. The treatment provided no relief.       Past Medical History:  Diagnosis Date  . Asthma   . Bronchitis, acute   . Depression affecting pregnancy, antepartum   . Drug abuse, cocaine type (HCC)   . Drug abuse, marijuana   . Hypertension     Patient Active Problem List   Diagnosis Date Noted  . Preterm delivery 10/11/2014  . Penicillin allergy 09/17/2014  . Marijuana use 07/22/2014  . Cocaine abuse affecting pregnancy, antepartum (HCC) 07/18/2014  . Depression affecting pregnancy, antepartum 07/18/2014  . 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 MDD (major depressive disorder) 03/12/2014  . 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 Cocaine abuse [facility])     Past Surgical History:  Procedure Laterality Date  . APPENDECTOMY    . HAND SURGERY       OB History    Gravida  4   Para  4   Term  3   Preterm  1   AB      Living  3     SAB      TAB      Ectopic      Multiple  0   Live Births  2           Family History  Problem Relation Age of Onset  . Cancer Other   . Asthma Mother   . Asthma Father   . Depression Father   . Depression Sister   . Depression Brother   .  Diabetes Paternal Grandmother     Social History   Tobacco Use  . Smoking status: Current Every Day Smoker    Packs/day: 0.50    Types: Cigarettes  . Smokeless tobacco: Never Used  Vaping Use  . Vaping Use: Never used  Substance Use Topics  . Alcohol use: Yes    Comment: "occasional drinks"  . Drug use: Yes    Types: Marijuana    Comment: positive UDS for cocaine & MJ 12/11/13; states she uses cocaine on Mondays    Home Medications Prior to Admission medications   Medication Sig Start Date End Date Taking? Authorizing Provider  acetaminophen (TYLENOL) 650 MG CR tablet Take 650 mg by mouth every 8 (eight) hours as needed for pain.    [provider]  cephALEXin (KEFLEX) 500 MG capsule Take 1 capsule (500 mg total) by mouth 3 (three) times daily. 07/11/18   Irean Hong, MD  doxycycline (VIBRAMYCIN) 100 MG capsule Take 1 capsule (100 mg total) by mouth 2 (two) times daily. One po bid x 7 days 08/26/19   Bethann Berkshire, MD  ondansetron (ZOFRAN ODT) 4 MG disintegrating tablet Take 1 tablet (4 mg total) by mouth every 8 (eight) hours as  needed for nausea or vomiting. 07/11/18   Irean Hong, MD  prednisoLONE acetate (PRED FORTE) 1 % ophthalmic suspension Place 1 drop into the left eye 4 (four) times daily. 03/29/18   Bethel Born, PA-C    Allergies    Penicillins and Percocet [oxycodone-acetaminophen]  Review of Systems   Review of Systems  Constitutional: Negative for appetite change and fatigue.  HENT: Negative for congestion, ear discharge and sinus pressure.   Eyes: Negative for discharge.  Respiratory: Positive for shortness of breath and wheezing. Negative for cough.   Cardiovascular: Negative for chest pain.  Gastrointestinal: Negative for abdominal pain and diarrhea.  Genitourinary: Negative for frequency and hematuria.  Musculoskeletal: Negative for back pain.  Skin: Negative for rash.  Neurological: Negative for seizures and headaches.    Psychiatric/Behavioral: Negative for hallucinations.    Physical Exam Updated Vital Signs BP (!) 118/55 (BP Location: Right Arm)   Pulse 57   Temp 99.1 F (37.3 C) (Oral) Comment: Recheck temp.  Resp 18   LMP  (LMP Unknown)   SpO2 97%   Physical Exam Vitals and nursing note reviewed.  Constitutional:      Appearance: She is well-developed.  HENT:     Head: Normocephalic.     Nose: Nose normal.  Eyes:     General: No scleral icterus.    Conjunctiva/sclera: Conjunctivae normal.  Neck:     Thyroid: No thyromegaly.  Cardiovascular:     Rate and Rhythm: Normal rate and regular rhythm.     Heart sounds: No murmur heard.  No friction rub. No gallop.   Pulmonary:     Breath sounds: No stridor. Wheezing present. No rales.  Chest:     Chest wall: No tenderness.  Abdominal:     General: There is no distension.     Tenderness: There is no abdominal tenderness. There is no rebound.  Musculoskeletal:        General: Normal range of motion.     Cervical back: Neck supple.  Lymphadenopathy:     Cervical: No cervical adenopathy.  Skin:    Findings: No erythema or rash.  Neurological:     Mental Status: She is alert and oriented to person, place, and time.     Motor: No abnormal muscle tone.     Coordination: Coordination normal.  Psychiatric:        Behavior: Behavior normal.     ED Results / Procedures / Treatments   Labs (all labs ordered are listed, but only abnormal results are displayed) Labs Reviewed  COMPREHENSIVE METABOLIC PANEL - Abnormal; Notable for the following components:      Result Value   Potassium 3.0 (*)    Glucose, Bld 111 (*)    Calcium 8.6 (*)    All other components within normal limits  SARS CORONAVIRUS 2 BY RT PCR (HOSPITAL ORDER, PERFORMED IN [facility])  CBC WITH DIFFERENTIAL/PLATELET    EKG None  Radiology DG Chest Port 1 View  Result Date: 08/26/2019 CLINICAL DATA:  Shortness of breath, fever, cough, history asthma  EXAM: PORTABLE CHEST 1 VIEW COMPARISON:  Portable exam 1004 hours compared to 06/27/2017 FINDINGS: Normal heart size, mediastinal contours, and pulmonary vascularity. Lungs clear. No infiltrate, pleural effusion or pneumothorax. Osseous structures unremarkable. IMPRESSION: Normal exam. Electronically Signed   By: Ulyses Southward M.D.   On: 08/26/2019 10:16    Procedures Procedures (including critical care time)  Medications Ordered in ED Medications  albuterol (VENTOLIN HFA) 108 (  90 Base) MCG/ACT inhaler 2 puff (2 puffs Inhalation Given 08/26/19 0955)  HYDROcodone-acetaminophen (NORCO/VICODIN) 5-325 MG per tablet 1 tablet (1 tablet Oral Given 08/26/19 0955)  ondansetron (ZOFRAN-ODT) disintegrating tablet 4 mg (4 mg Oral Given 08/26/19 0955)  potassium chloride SA (KLOR-CON) CR tablet 40 mEq (40 mEq Oral Given 08/26/19 1058)    ED Course  I have reviewed the triage vital signs and the nursing notes.  Pertinent labs & imaging results that were available during my care of the patient were reviewed by me and considered in my medical decision making (see chart for details).    MDM Rules/Calculators/A&P                          Patient with asthma bronchitis she will be sent home on albuterol and doxycycline       This patient presents to the ED for concern of shortness of breath this involves an extensive number of treatment options, and is a complaint that carries with it a high risk of complications and morbidity.  The differential diagnosis includes pneumonia asthma exacerbation   Lab Tests:   I Ordered, reviewed, and interpreted labs, which included CBC chemistries that show mild hypokalemia  Medicines ordered:   I ordered medication albuterol  Imaging Studies ordered:   I ordered imaging studies which included chest x-ray and I independently visualized and interpreted imaging which showed unremarkable Additional history obtained:   Additional history obtained from  record Previous records obtained and reviewed. Consultations Obtained:     Reevaluation:  After the interventions stated above, I reevaluated the patient and found improved  Critical Interventions:  .   Final Clinical Impression(s) / ED Diagnoses Final diagnoses:  Mild intermittent asthma with exacerbation    Rx / DC Orders ED Discharge Orders         Ordered    doxycycline (VIBRAMYCIN) 100 MG capsule  2 times daily     Discontinue  Reprint     08/26/19 1200           Bethann Berkshire, MD 08/26/19 1204

## 2019-09-10 ENCOUNTER — Emergency Department (HOSPITAL_COMMUNITY)
Admission: EM | Admit: 2019-09-10 | Discharge: 2019-09-11 | Disposition: A | Discharge status: Home / Self Care | Payer: Medicaid Other | Attending: Emergency Medicine | Admitting: Emergency Medicine

## 2019-09-10 ENCOUNTER — Encounter (HOSPITAL_COMMUNITY): Payer: Self-pay

## 2019-09-10 ENCOUNTER — Emergency Department (HOSPITAL_COMMUNITY): Payer: Medicaid Other

## 2019-09-10 ENCOUNTER — Other Ambulatory Visit: Payer: Self-pay

## 2019-09-10 DIAGNOSIS — Y929 Unspecified place or not applicable: Secondary | ICD-10-CM | POA: Insufficient documentation

## 2019-09-10 DIAGNOSIS — S99921A Unspecified injury of right foot, initial encounter: Secondary | ICD-10-CM | POA: Diagnosis present

## 2019-09-10 DIAGNOSIS — S92424A Nondisplaced fracture of distal phalanx of right great toe, initial encounter for closed fracture: Secondary | ICD-10-CM | POA: Diagnosis not present

## 2019-09-10 DIAGNOSIS — I10 Essential (primary) hypertension: Secondary | ICD-10-CM | POA: Diagnosis not present

## 2019-09-10 DIAGNOSIS — Y999 Unspecified external cause status: Secondary | ICD-10-CM | POA: Diagnosis not present

## 2019-09-10 DIAGNOSIS — F17210 Nicotine dependence, cigarettes, uncomplicated: Secondary | ICD-10-CM | POA: Insufficient documentation

## 2019-09-10 DIAGNOSIS — Y939 Activity, unspecified: Secondary | ICD-10-CM | POA: Diagnosis not present

## 2019-09-10 DIAGNOSIS — J45909 Unspecified asthma, uncomplicated: Secondary | ICD-10-CM | POA: Insufficient documentation
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
## 2019-09-11 MED ORDER — IBUPROFEN 400 MG PO TABS
600.0000 mg | ORAL_TABLET | Freq: Once | ORAL | Status: AC
  Administered 2019-09-11: 600 mg via ORAL
  Filled 2019-09-11: qty 1

## 2019-09-11 NOTE — Discharge Instructions (Signed)
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
## 2019-09-11 NOTE — ED Notes (Signed)
Pt is noted to have 2+ pedal pulses bilaterally with slight swelling on right affected side

## 2019-09-11 NOTE — Progress Notes (Signed)
Orthopedic Tech Progress Note Patient Details:  JNIYAH DANTUONO 1980/05/12 [id number]  Ortho Devices Type of Ortho Device: Buddy tape, Postop shoe/boot, Crutches Ortho Device/Splint Location: Right Big Toe Ortho Device/Splint Interventions: Application, Adjustment   Post Interventions Patient Tolerated: Well Instructions Provided: Poper ambulation with device, Adjustment of device   Donny Heffern E Ananias Kolander 09/11/2019, 2:20 AM

## 2019-11-04 ENCOUNTER — Emergency Department (HOSPITAL_COMMUNITY)
Admission: EM | Admit: 2019-11-04 | Discharge: 2019-11-04 | Disposition: A | Discharge status: Home / Self Care | Payer: Medicaid Other | Attending: Emergency Medicine | Admitting: Emergency Medicine

## 2019-11-04 ENCOUNTER — Other Ambulatory Visit: Payer: Self-pay

## 2019-11-04 ENCOUNTER — Encounter (HOSPITAL_COMMUNITY): Payer: Self-pay

## 2019-11-04 ENCOUNTER — Emergency Department (HOSPITAL_COMMUNITY): Payer: Medicaid Other

## 2019-11-04 DIAGNOSIS — J45909 Unspecified asthma, uncomplicated: Secondary | ICD-10-CM | POA: Diagnosis not present

## 2019-11-04 DIAGNOSIS — I10 Essential (primary) hypertension: Secondary | ICD-10-CM | POA: Insufficient documentation

## 2019-11-04 DIAGNOSIS — S60945A Unspecified superficial injury of left ring finger, initial encounter: Secondary | ICD-10-CM | POA: Diagnosis present

## 2019-11-04 DIAGNOSIS — F17210 Nicotine dependence, cigarettes, uncomplicated: Secondary | ICD-10-CM | POA: Insufficient documentation

## 2019-11-04 DIAGNOSIS — S6992XA Unspecified injury of left wrist, hand and finger(s), initial encounter: Secondary | ICD-10-CM
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
## 2019-11-04 NOTE — Discharge Instructions (Addendum)
Your finger x-ray looked unremarkable.  I placed you in a splint and taped your fingers.  I want you to use ibuprofen and/or Tylenol every 6 as needed for pain.  Please follow dosing on the back of bottle.  I also recommend placing ice over the area and keeping it elevated as this will help decrease swelling and inflammation.  It is imperative that you follow-up with hand surgery for further evaluation.  I have given the contact information please contact them today for further evaluation.  Come back to the emergency department if you develop pins and needle sensation in your finger, your finger turns a different color, chest pain, shortness of breath, abdominal pain, nausea, vomiting, diarrhea.
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
## 2019-11-29 ENCOUNTER — Emergency Department (HOSPITAL_COMMUNITY)
Admission: EM | Admit: 2019-11-29 | Discharge: 2019-11-29 | Disposition: A | Discharge status: Home / Self Care | Payer: Medicaid Other | Attending: Emergency Medicine | Admitting: Emergency Medicine

## 2019-11-29 ENCOUNTER — Encounter (HOSPITAL_COMMUNITY): Payer: Self-pay | Admitting: Emergency Medicine

## 2019-11-29 ENCOUNTER — Other Ambulatory Visit: Payer: Self-pay

## 2019-11-29 DIAGNOSIS — M7989 Other specified soft tissue disorders: Secondary | ICD-10-CM | POA: Insufficient documentation

## 2019-11-29 DIAGNOSIS — Z5321 Procedure and treatment not carried out due to patient leaving prior to being seen by health care provider: Secondary | ICD-10-CM | POA: Insufficient documentation

## 2019-11-29 NOTE — ED Triage Notes (Signed)
Patient states that she was seen two weeks ago for swollen left ring finger. Patient states that she was told to come back if it was swollen again.

## 2021-02-16 ENCOUNTER — Other Ambulatory Visit: Payer: Self-pay

## 2021-02-16 ENCOUNTER — Emergency Department (HOSPITAL_COMMUNITY): Payer: Medicaid Other

## 2021-02-16 ENCOUNTER — Emergency Department (HOSPITAL_COMMUNITY)
Admission: EM | Admit: 2021-02-16 | Discharge: 2021-02-17 | Disposition: A | Discharge status: Home / Self Care | Payer: Medicaid Other | Attending: Emergency Medicine | Admitting: Emergency Medicine

## 2021-02-16 DIAGNOSIS — M542 Cervicalgia: Secondary | ICD-10-CM | POA: Diagnosis not present

## 2021-02-16 DIAGNOSIS — Y900 Blood alcohol level of less than 20 mg/100 ml: Secondary | ICD-10-CM | POA: Diagnosis not present

## 2021-02-16 DIAGNOSIS — M549 Dorsalgia, unspecified: Secondary | ICD-10-CM | POA: Insufficient documentation

## 2021-02-16 DIAGNOSIS — R519 Headache, unspecified: Secondary | ICD-10-CM | POA: Diagnosis not present

## 2021-02-16 DIAGNOSIS — Z20822 Contact with and (suspected) exposure to covid-19: Secondary | ICD-10-CM | POA: Insufficient documentation

## 2021-02-16 DIAGNOSIS — R109 Unspecified abdominal pain: Secondary | ICD-10-CM

## 2021-02-16 DIAGNOSIS — R55 Syncope and collapse: Secondary | ICD-10-CM | POA: Diagnosis not present

## 2021-02-16 DIAGNOSIS — R079 Chest pain, unspecified: Secondary | ICD-10-CM

## 2021-02-16 DIAGNOSIS — R0789 Other chest pain: Secondary | ICD-10-CM | POA: Insufficient documentation

## 2021-02-16 LAB — COMPREHENSIVE METABOLIC PANEL
ALT: 12 U/L (ref 0–44)
AST: 14 U/L — ABNORMAL LOW (ref 15–41)
Albumin: 3.7 g/dL (ref 3.5–5.0)
Alkaline Phosphatase: 41 U/L (ref 38–126)
Anion gap: 8 (ref 5–15)
BUN: 7 mg/dL (ref 6–20)
CO2: 25 mmol/L (ref 22–32)
Calcium: 8.9 mg/dL (ref 8.9–10.3)
Chloride: 106 mmol/L (ref 98–111)
Creatinine, Ser: 0.9 mg/dL (ref 0.44–1.00)
GFR, Estimated: >60 mL/min (ref >60)
Glucose, Bld: 91 mg/dL (ref 70–99)
Potassium: 3.7 mmol/L (ref 3.5–5.1)
Sodium: 139 mmol/L (ref 135–145)
Total Bilirubin: 0.5 mg/dL (ref 0.3–1.2)
Total Protein: 7.1 g/dL (ref 6.5–8.1)

## 2021-02-16 LAB — I-STAT CHEM 8, ED
BUN: 8 mg/dL (ref 6–20)
Calcium, Ion: 1.22 mmol/L (ref 1.15–1.40)
Chloride: 105 mmol/L (ref 98–111)
Creatinine, Ser: 0.8 mg/dL (ref 0.44–1.00)
Glucose, Bld: 84 mg/dL (ref 70–99)
HCT: 45 % (ref 36.0–46.0)
Hemoglobin: 15.3 g/dL — ABNORMAL HIGH (ref 12.0–15.0)
Potassium: 3.8 mmol/L (ref 3.5–5.1)
Sodium: 140 mmol/L (ref 135–145)
TCO2: 26 mmol/L (ref 22–32)

## 2021-02-16 LAB — CBC
HCT: 41.5 % (ref 36.0–46.0)
Hemoglobin: 13.3 g/dL (ref 12.0–15.0)
MCH: 30.6 pg (ref 26.0–34.0)
MCHC: 32 g/dL (ref 30.0–36.0)
MCV: 95.4 fL (ref 80.0–100.0)
Platelets: 220 10*3/uL (ref 150–400)
RBC: 4.35 MIL/uL (ref 3.87–5.11)
RDW: 12.7 % (ref 11.5–15.5)
WBC: 6.9 10*3/uL (ref 4.0–10.5)
nRBC: 0 % (ref 0.0–0.2)

## 2021-02-16 LAB — PROTIME-INR
INR: 0.9 (ref 0.8–1.2)
Prothrombin Time: 11.6 seconds (ref 11.4–15.2)

## 2021-02-16 LAB — RESP PANEL BY RT-PCR (FLU A&B, COVID) ARPGX2
Influenza A by PCR: NEGATIVE
Influenza B by PCR: NEGATIVE
SARS Coronavirus 2 by RT PCR: NEGATIVE

## 2021-02-16 LAB — I-STAT BETA HCG BLOOD, ED (MC, WL, AP ONLY): I-stat hCG, quantitative: <5 m[IU]/mL (ref <5)

## 2021-02-16 LAB — ETHANOL: Alcohol, Ethyl (B): <10 mg/dL (ref <10)

## 2021-02-16 LAB — LACTIC ACID, PLASMA: Lactic Acid, Venous: 1.2 mmol/L (ref 0.5–1.9)

## 2021-02-16 MED ORDER — IOHEXOL 300 MG/ML  SOLN
100.0000 mL | Freq: Once | INTRAMUSCULAR | Status: AC | PRN
  Administered 2021-02-16: 100 mL via INTRAVENOUS
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
## 2021-02-17 NOTE — Discharge Instructions (Signed)
[facility]: [address] [place], [place] [address] [phone] Walk-in hours: Monday - Friday, 8am - 3pm    [facility] [employer]: [phone] (24 hours a day)    [facility] [address] [place], [place] [phone]

## 2021-04-29 ENCOUNTER — Other Ambulatory Visit: Payer: Self-pay

## 2021-04-29 ENCOUNTER — Emergency Department (HOSPITAL_COMMUNITY): Payer: Medicaid Other

## 2021-04-29 ENCOUNTER — Emergency Department (HOSPITAL_COMMUNITY)
Admission: EM | Admit: 2021-04-29 | Discharge: 2021-04-29 | Disposition: A | Discharge status: Home / Self Care | Payer: Medicaid Other | Attending: Emergency Medicine | Admitting: Emergency Medicine

## 2021-04-29 ENCOUNTER — Encounter (HOSPITAL_COMMUNITY): Payer: Self-pay | Admitting: Emergency Medicine

## 2021-04-29 DIAGNOSIS — M7918 Myalgia, other site: Secondary | ICD-10-CM | POA: Insufficient documentation

## 2021-04-29 DIAGNOSIS — J40 Bronchitis, not specified as acute or chronic: Secondary | ICD-10-CM | POA: Diagnosis not present

## 2021-04-29 DIAGNOSIS — R6889 Other general symptoms and signs: Secondary | ICD-10-CM

## 2021-04-29 DIAGNOSIS — Z20822 Contact with and (suspected) exposure to covid-19: Secondary | ICD-10-CM | POA: Diagnosis not present

## 2021-04-29 DIAGNOSIS — R0602 Shortness of breath: Secondary | ICD-10-CM | POA: Diagnosis present

## 2021-04-29 LAB — RESP PANEL BY RT-PCR (FLU A&B, COVID) ARPGX2
Influenza A by PCR: NEGATIVE
Influenza B by PCR: NEGATIVE
SARS Coronavirus 2 by RT PCR: NEGATIVE

## 2021-04-29 LAB — CBC WITH DIFFERENTIAL/PLATELET
Abs Immature Granulocytes: 0.02 10*3/uL (ref 0.00–0.07)
Basophils Absolute: 0 10*3/uL (ref 0.0–0.1)
Basophils Relative: 0 %
Eosinophils Absolute: 0.2 10*3/uL (ref 0.0–0.5)
Eosinophils Relative: 2 %
HCT: 39.4 % (ref 36.0–46.0)
Hemoglobin: 13 g/dL (ref 12.0–15.0)
Immature Granulocytes: 0 %
Lymphocytes Relative: 15 %
Lymphs Abs: 1.1 10*3/uL (ref 0.7–4.0)
MCH: 30.9 pg (ref 26.0–34.0)
MCHC: 33 g/dL (ref 30.0–36.0)
MCV: 93.6 fL (ref 80.0–100.0)
Monocytes Absolute: 0.6 10*3/uL (ref 0.1–1.0)
Monocytes Relative: 8 %
Neutro Abs: 5.8 10*3/uL (ref 1.7–7.7)
Neutrophils Relative %: 75 %
Platelets: 193 10*3/uL (ref 150–400)
RBC: 4.21 MIL/uL (ref 3.87–5.11)
RDW: 12.7 % (ref 11.5–15.5)
WBC: 7.7 10*3/uL (ref 4.0–10.5)
nRBC: 0 % (ref 0.0–0.2)

## 2021-04-29 LAB — COMPREHENSIVE METABOLIC PANEL
ALT: 13 U/L (ref 0–44)
AST: 14 U/L — ABNORMAL LOW (ref 15–41)
Albumin: 3.7 g/dL (ref 3.5–5.0)
Alkaline Phosphatase: 46 U/L (ref 38–126)
Anion gap: 7 (ref 5–15)
BUN: 6 mg/dL (ref 6–20)
CO2: 25 mmol/L (ref 22–32)
Calcium: 8.9 mg/dL (ref 8.9–10.3)
Chloride: 106 mmol/L (ref 98–111)
Creatinine, Ser: 0.78 mg/dL (ref 0.44–1.00)
GFR, Estimated: >60 mL/min (ref >60)
Glucose, Bld: 90 mg/dL (ref 70–99)
Potassium: 3.5 mmol/L (ref 3.5–5.1)
Sodium: 138 mmol/L (ref 135–145)
Total Bilirubin: 0.9 mg/dL (ref 0.3–1.2)
Total Protein: 7.1 g/dL (ref 6.5–8.1)

## 2021-04-29 MED ORDER — PREDNISONE 20 MG PO TABS
60.0000 mg | ORAL_TABLET | Freq: Once | ORAL | Status: AC
  Administered 2021-04-29: 60 mg via ORAL
  Filled 2021-04-29: qty 3

## 2021-04-29 MED ORDER — IPRATROPIUM-ALBUTEROL 0.5-2.5 (3) MG/3ML IN SOLN: 3.0000 mL | Freq: Once | RESPIRATORY_TRACT | Status: DC

## 2021-04-29 MED ORDER — NAPROXEN 375 MG PO TABS: 375.0000 mg | ORAL_TABLET | Freq: Two times a day (BID) | ORAL | 0 refills | Status: AC

## 2021-04-29 MED ORDER — METHYLPREDNISOLONE 4 MG PO TBPK: ORAL_TABLET | ORAL | 0 refills | Status: AC

## 2021-04-29 MED ORDER — ALBUTEROL SULFATE HFA 108 (90 BASE) MCG/ACT IN AERS
2.0000 | INHALATION_SPRAY | Freq: Once | RESPIRATORY_TRACT | Status: AC
Start: 2021-04-29 — End: 2021-04-29
  Administered 2021-04-29: 2 via RESPIRATORY_TRACT
  Filled 2021-04-29: qty 6.7

## 2021-04-29 MED ORDER — ACETAMINOPHEN 500 MG PO TABS
1000.0000 mg | ORAL_TABLET | Freq: Once | ORAL | Status: AC
  Administered 2021-04-29: 1000 mg via ORAL
  Filled 2021-04-29: qty 2

## 2021-04-29 MED ORDER — BENZONATATE 100 MG PO CAPS: 100.0000 mg | ORAL_CAPSULE | Freq: Three times a day (TID) | ORAL | 0 refills | Status: DC

## 2021-04-29 NOTE — ED Provider Notes (Signed)
?[facility]-EMERGENCY DEPT ?Provider Note ? ? ?CSN: [id number] ?Arrival date & time: 04/29/21  1718 ? ?  ? ?History ? ?Chief Complaint  ?Patient presents with  ? Shortness of Breath  ? ? ?Nicole Mahoney is a 41 y.o. female with a past medical history of polysubstance abuse who presents the emergency department with complaint of flulike symptoms.  EMS reports that the patient was being evicted from her hotel room tonight.  She apparently was banging on the managers door loudly complaint started complaining that she was very short of breath and EMS was called.  She was apparently very uncooperative with them as well as the triage nurse.  Patient tells me that she has been having body aches chills and productive cough for the past week.  She complains that it hurts when she coughs.  She has not asking for Tylenol for pain.  She denies any other current issues, she denies urinary symptoms, nausea, vomiting. ? ? ?Shortness of Breath ? ?  ? ?Home Medications ?Prior to Admission medications   ?Medication Sig Start Date End Date Taking? Authorizing Provider  ?benzonatate (TESSALON) 100 MG capsule Take 1 capsule (100 mg total) by mouth every 8 (eight) hours. 04/29/21  Yes Arthor Captain, PA-C  ?methylPREDNISolone (MEDROL DOSEPAK) 4 MG TBPK tablet Use as directed 04/29/21  Yes Arthor Captain, PA-C  ?naproxen (NAPROSYN) 375 MG tablet Take 1 tablet (375 mg total) by mouth 2 (two) times daily with a meal. 04/29/21  Yes Arthor Captain, PA-C  ?acetaminophen (TYLENOL) 650 MG CR tablet Take 650 mg by mouth every 8 (eight) hours as needed for pain.    [provider]  ?cephALEXin (KEFLEX) 500 MG capsule Take 1 capsule (500 mg total) by mouth 3 (three) times daily. 07/11/18   Irean Hong, MD  ?doxycycline (VIBRAMYCIN) 100 MG capsule Take 1 capsule (100 mg total) by mouth 2 (two) times daily. One po bid x 7 days 08/26/19   Bethann Berkshire, MD  ?ondansetron (ZOFRAN ODT) 4 MG disintegrating tablet Take 1  tablet (4 mg total) by mouth every 8 (eight) hours as needed for nausea or vomiting. 07/11/18   Irean Hong, MD  ?prednisoLONE acetate (PRED FORTE) 1 % ophthalmic suspension Place 1 drop into the left eye 4 (four) times daily. 03/29/18   Bethel Born, PA-C  ?   ? ?Allergies    ?Penicillins and Percocet [oxycodone-acetaminophen]   ? ?Review of Systems   ?Review of Systems  ?Respiratory:  Positive for shortness of breath.   ? ?Physical Exam ?Updated Vital Signs ?BP 135/89 (BP Location: Right Arm)   Pulse 77   Temp 98 ?F (36.7 ?C) (Oral)   Resp 18   SpO2 95%  ?Physical Exam ?Vitals and nursing note reviewed.  ?Constitutional:   ?   General: She is not in acute distress. ?   Appearance: She is well-developed. She is not diaphoretic.  ?HENT:  ?   Head: Normocephalic and atraumatic.  ?   Right Ear: External ear normal.  ?   Left Ear: External ear normal.  ?   Nose: Nose normal.  ?   Mouth/Throat:  ?   Mouth: Mucous membranes are moist.  ?Eyes:  ?   General: No scleral icterus. ?   Extraocular Movements: Extraocular movements intact.  ?   Conjunctiva/sclera: Conjunctivae normal.  ?   Pupils: Pupils are equal, round, and reactive to light.  ?Cardiovascular:  ?   Rate and Rhythm: Normal rate and  regular rhythm.  ?   Heart sounds: Normal heart sounds. No murmur heard. ?  No friction rub. No gallop.  ?Pulmonary:  ?   Effort: Pulmonary effort is normal. No respiratory distress.  ?   Breath sounds: Wheezing present.  ?Abdominal:  ?   General: Bowel sounds are normal. There is no distension.  ?   Palpations: Abdomen is soft. There is no mass.  ?   Tenderness: There is no abdominal tenderness. There is no guarding.  ?Musculoskeletal:  ?   Cervical back: Normal range of motion.  ?Skin: ?   General: Skin is warm and dry.  ?Neurological:  ?   Mental Status: She is alert and oriented to person, place, and time.  ?Psychiatric:     ?   Behavior: Behavior normal.  ? ? ?ED Results / Procedures / Treatments   ?Labs ?(all labs  ordered are listed, but only abnormal results are displayed) ?Labs Reviewed  ?COMPREHENSIVE METABOLIC PANEL - Abnormal; Notable for the following components:  ?    Result Value  ? AST 14 (*)   ? All other components within normal limits  ?RESP PANEL BY RT-PCR (FLU A&B, COVID) [id number]  ?CBC WITH DIFFERENTIAL/PLATELET  ? ? ?EKG ?EKG Interpretation ? ?Date/Time:  Sunday April 29 2021 17:36:22 EDT ?Ventricular Rate:  84 ?PR Interval:  116 ?QRS Duration: 93 ?QT Interval:  379 ?QTC Calculation: 448 ?R Axis:   -40 ?Text Interpretation: Sinus rhythm Borderline short PR interval Left axis deviation Anterior infarct, old Confirmed by Lockie Mola, Adam [id number]) on 04/29/2021 5:53:20 PM ? ?Radiology ?No results found. ? ?Procedures ?Procedures  ? ? ?Medications Ordered in ED ?Medications  ?acetaminophen (TYLENOL) tablet 1,000 mg (1,000 mg Oral Given 04/29/21 2239)  ?predniSONE (DELTASONE) tablet 60 mg (60 mg Oral Given 04/29/21 2239)  ?albuterol (VENTOLIN HFA) 108 (90 Base) MCG/ACT inhaler 2 puff (2 puffs Inhalation Given 04/29/21 2239)  ? ? ?ED Course/ Medical Decision Making/ A&P ?  ?                        ?Medical Decision Making ?Amount and/or Complexity of Data Reviewed ?Labs: ordered. ? ?Risk ?OTC drugs. ?Prescription drug management. ? ? ?41 y/o female with painful cough and body aches. ?Differential diagnosis for emergent cause of cough includes but is not limited to upper respiratory infection, lower respiratory infection, allergies, asthma, irritants, foreign body, medications such as ACE inhibitors, reflux, asthma, CHF, lung cancer, interstitial lung disease, psychiatric causes, postnasal drip and postinfectious bronchospasm. ? ?I reviewed labs, no Leukocytosis or other acute findings. ?Resp  panel negative. ? ? ? I visualized CXR- no acute findins, ?EKG shows NSR ? ? ?Suspect Viral URI ?Discussed op tx and return precautions. Tylenol given ? ?Final Clinical Impression(s) / ED Diagnoses ?Final diagnoses:  ?Flu-like symptoms   ?Bronchitis  ? ? ?Rx / DC Orders ?ED Discharge Orders   ? ?      Ordered  ?  naproxen (NAPROSYN) 375 MG tablet  2 times daily with meals       ? 04/29/21 2221  ?  methylPREDNISolone (MEDROL DOSEPAK) 4 MG TBPK tablet       ? 04/29/21 2221  ?  benzonatate (TESSALON) 100 MG capsule  Every 8 hours       ? 04/29/21 2221  ? ?  ?  ? ?  ? ? ?  ?Arthor Captain, PA-C ?05/01/21 2245 ? ?  ?Virgina Norfolk, DO ?05/02/21 1601 ? ?

## 2021-04-29 NOTE — ED Triage Notes (Signed)
BIB EMS from extended stay.  Pt non cooperative with EMS questioning.  Full lift assist.  Won't hold herself in a sitting position.  Wheezing noted by EMS ?

## 2021-04-29 NOTE — Discharge Instructions (Signed)
Get help right away if you: Cough up blood. Feel pain in your chest. Have severe shortness of breath. Faint or keep feeling like you are going to faint. Have a severe headache. Have a fever or chills that get worse. 
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
## 2021-04-29 NOTE — ED Notes (Signed)
Attempted to discharge patient and refusing to get out of room. Security called to room and patient became argumentative and yelling/cursing at staff. [employer] called to assist.  ?

## 2021-10-22 ENCOUNTER — Encounter (HOSPITAL_COMMUNITY): Payer: Self-pay

## 2021-10-22 ENCOUNTER — Other Ambulatory Visit: Payer: Self-pay

## 2021-10-22 ENCOUNTER — Emergency Department (HOSPITAL_COMMUNITY): Payer: Medicaid Other

## 2021-10-22 ENCOUNTER — Emergency Department (HOSPITAL_COMMUNITY)
Admission: EM | Admit: 2021-10-22 | Discharge: 2021-10-23 | Disposition: A | Discharge status: Home / Self Care | Payer: Medicaid Other | Attending: Emergency Medicine | Admitting: Emergency Medicine

## 2021-10-22 DIAGNOSIS — R0789 Other chest pain: Secondary | ICD-10-CM | POA: Insufficient documentation

## 2021-10-22 DIAGNOSIS — F1410 Cocaine abuse, uncomplicated: Secondary | ICD-10-CM

## 2021-10-22 DIAGNOSIS — Z79899 Other long term (current) drug therapy: Secondary | ICD-10-CM | POA: Diagnosis not present

## 2021-10-22 LAB — CBC WITH DIFFERENTIAL/PLATELET
Abs Immature Granulocytes: 0.02 10*3/uL (ref 0.00–0.07)
Basophils Absolute: 0.1 10*3/uL (ref 0.0–0.1)
Basophils Relative: 1 %
Eosinophils Absolute: 0.1 10*3/uL (ref 0.0–0.5)
Eosinophils Relative: 2 %
HCT: 41.1 % (ref 36.0–46.0)
Hemoglobin: 13.3 g/dL (ref 12.0–15.0)
Immature Granulocytes: 0 %
Lymphocytes Relative: 31 %
Lymphs Abs: 2.4 10*3/uL (ref 0.7–4.0)
MCH: 31.2 pg (ref 26.0–34.0)
MCHC: 32.4 g/dL (ref 30.0–36.0)
MCV: 96.5 fL (ref 80.0–100.0)
Monocytes Absolute: 0.5 10*3/uL (ref 0.1–1.0)
Monocytes Relative: 6 %
Neutro Abs: 4.7 10*3/uL (ref 1.7–7.7)
Neutrophils Relative %: 60 %
Platelets: 258 10*3/uL (ref 150–400)
RBC: 4.26 MIL/uL (ref 3.87–5.11)
RDW: 12.3 % (ref 11.5–15.5)
WBC: 7.8 10*3/uL (ref 4.0–10.5)
nRBC: 0 % (ref 0.0–0.2)

## 2021-10-22 LAB — RAPID URINE DRUG SCREEN, HOSP PERFORMED
Amphetamines: NOT DETECTED
Barbiturates: NOT DETECTED
Benzodiazepines: NOT DETECTED
Cocaine: POSITIVE — AB
Opiates: NOT DETECTED
Tetrahydrocannabinol: POSITIVE — AB

## 2021-10-22 LAB — BASIC METABOLIC PANEL
Anion gap: 8 (ref 5–15)
BUN: 14 mg/dL (ref 6–20)
CO2: 26 mmol/L (ref 22–32)
Calcium: 8.9 mg/dL (ref 8.9–10.3)
Chloride: 106 mmol/L (ref 98–111)
Creatinine, Ser: 0.94 mg/dL (ref 0.44–1.00)
GFR, Estimated: >60 mL/min (ref >60)
Glucose, Bld: 102 mg/dL — ABNORMAL HIGH (ref 70–99)
Potassium: 3.9 mmol/L (ref 3.5–5.1)
Sodium: 140 mmol/L (ref 135–145)

## 2021-10-22 LAB — I-STAT BETA HCG BLOOD, ED (MC, WL, AP ONLY): I-stat hCG, quantitative: <5 m[IU]/mL (ref <5)

## 2021-10-22 LAB — TROPONIN I (HIGH SENSITIVITY)
Troponin I (High Sensitivity): 3 ng/L (ref <18)
Troponin I (High Sensitivity): 2 ng/L (ref <18)

## 2021-10-22 MED ORDER — KETOROLAC TROMETHAMINE 15 MG/ML IJ SOLN
15.0000 mg | Freq: Once | INTRAMUSCULAR | Status: AC
  Administered 2021-10-22: 15 mg via INTRAVENOUS
  Filled 2021-10-22: qty 1
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
## 2021-10-22 NOTE — Progress Notes (Signed)
CSW added resources, RNCM assisted CSW with this PT.

## 2021-10-22 NOTE — ED Notes (Signed)
Social work at bedside.  
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
## 2021-10-22 NOTE — ED Triage Notes (Signed)
Patient has been on a cocaine binge and started having chest pain. This has been going on for 4 days.

## 2021-10-22 NOTE — Discharge Instructions (Addendum)
Use Tylenol every 4 hours and ibuprofen every 6 as needed for pain.  Added many resources to the Pt AVS, please reach out for assistance.
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
## 2022-03-12 ENCOUNTER — Other Ambulatory Visit: Payer: Self-pay

## 2022-03-12 ENCOUNTER — Emergency Department (HOSPITAL_COMMUNITY)
Admission: EM | Admit: 2022-03-12 | Discharge: 2022-03-12 | Disposition: A | Discharge status: Home / Self Care | Payer: Medicaid Other | Attending: Emergency Medicine | Admitting: Emergency Medicine

## 2022-03-12 ENCOUNTER — Emergency Department (HOSPITAL_COMMUNITY): Payer: Medicaid Other

## 2022-03-12 ENCOUNTER — Encounter (HOSPITAL_COMMUNITY): Payer: Self-pay

## 2022-03-12 DIAGNOSIS — H538 Other visual disturbances: Secondary | ICD-10-CM | POA: Insufficient documentation

## 2022-03-12 DIAGNOSIS — H5711 Ocular pain, right eye: Secondary | ICD-10-CM | POA: Diagnosis present

## 2022-03-12 DIAGNOSIS — W2203XA Walked into furniture, initial encounter: Secondary | ICD-10-CM | POA: Diagnosis not present

## 2022-03-12 DIAGNOSIS — H20041 Secondary noninfectious iridocyclitis, right eye: Secondary | ICD-10-CM | POA: Insufficient documentation

## 2022-03-12 DIAGNOSIS — H209 Unspecified iridocyclitis: Secondary | ICD-10-CM

## 2022-03-12 LAB — I-STAT BETA HCG BLOOD, ED (MC, WL, AP ONLY): I-stat hCG, quantitative: <5 m[IU]/mL (ref <5)

## 2022-03-12 MED ORDER — FLUORESCEIN SODIUM 1 MG OP STRP
1.0000 | ORAL_STRIP | Freq: Once | OPHTHALMIC | Status: DC
  Filled 2022-03-12: qty 1

## 2022-03-12 MED ORDER — TETRACAINE HCL 0.5 % OP SOLN
2.0000 [drp] | Freq: Once | OPHTHALMIC | Status: DC
  Filled 2022-03-12: qty 4

## 2022-03-12 MED ORDER — ONDANSETRON 4 MG PO TBDP
4.0000 mg | ORAL_TABLET | Freq: Once | ORAL | Status: AC
  Administered 2022-03-12: 4 mg via ORAL
  Filled 2022-03-12: qty 1

## 2022-03-12 MED ORDER — OXYCODONE-ACETAMINOPHEN 5-325 MG PO TABS
1.0000 | ORAL_TABLET | Freq: Once | ORAL | Status: AC
  Administered 2022-03-12: 1 via ORAL
  Filled 2022-03-12: qty 1
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
## 2022-03-12 NOTE — Discharge Instructions (Signed)
Dr.Bevis wants to see you in his office tonight. Please walk over to his office at [place]. His number is [phone] if you need help finding his office.
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
## 2022-03-25 ENCOUNTER — Emergency Department (HOSPITAL_COMMUNITY)
Admission: EM | Admit: 2022-03-25 | Discharge: 2022-03-25 | Discharge status: Against Medical Advice | Payer: Medicaid Other | Attending: Emergency Medicine | Admitting: Emergency Medicine

## 2022-03-25 ENCOUNTER — Emergency Department (HOSPITAL_COMMUNITY): Payer: Medicaid Other

## 2022-03-25 ENCOUNTER — Encounter (HOSPITAL_COMMUNITY): Payer: Self-pay

## 2022-03-25 DIAGNOSIS — R0789 Other chest pain: Secondary | ICD-10-CM | POA: Insufficient documentation

## 2022-03-25 DIAGNOSIS — R1010 Upper abdominal pain, unspecified: Secondary | ICD-10-CM

## 2022-03-25 DIAGNOSIS — Z1152 Encounter for screening for COVID-19: Secondary | ICD-10-CM | POA: Insufficient documentation

## 2022-03-25 DIAGNOSIS — R112 Nausea with vomiting, unspecified: Secondary | ICD-10-CM | POA: Diagnosis not present

## 2022-03-25 DIAGNOSIS — R197 Diarrhea, unspecified: Secondary | ICD-10-CM | POA: Insufficient documentation

## 2022-03-25 LAB — CBC WITH DIFFERENTIAL/PLATELET
Abs Immature Granulocytes: 0.01 10*3/uL (ref 0.00–0.07)
Basophils Absolute: 0 10*3/uL (ref 0.0–0.1)
Basophils Relative: 1 %
Eosinophils Absolute: 0.1 10*3/uL (ref 0.0–0.5)
Eosinophils Relative: 2 %
HCT: 39.7 % (ref 36.0–46.0)
Hemoglobin: 12.8 g/dL (ref 12.0–15.0)
Immature Granulocytes: 0 %
Lymphocytes Relative: 45 %
Lymphs Abs: 2.9 10*3/uL (ref 0.7–4.0)
MCH: 30.8 pg (ref 26.0–34.0)
MCHC: 32.2 g/dL (ref 30.0–36.0)
MCV: 95.7 fL (ref 80.0–100.0)
Monocytes Absolute: 0.4 10*3/uL (ref 0.1–1.0)
Monocytes Relative: 6 %
Neutro Abs: 3.1 10*3/uL (ref 1.7–7.7)
Neutrophils Relative %: 46 %
Platelets: 234 10*3/uL (ref 150–400)
RBC: 4.15 MIL/uL (ref 3.87–5.11)
RDW: 12.4 % (ref 11.5–15.5)
WBC: 6.5 10*3/uL (ref 4.0–10.5)
nRBC: 0 % (ref 0.0–0.2)

## 2022-03-25 LAB — RESP PANEL BY RT-PCR (RSV, FLU A&B, COVID)  RVPGX2
Influenza A by PCR: NEGATIVE
Influenza B by PCR: NEGATIVE
Resp Syncytial Virus by PCR: NEGATIVE
SARS Coronavirus 2 by RT PCR: NEGATIVE

## 2022-03-25 LAB — URINALYSIS, ROUTINE W REFLEX MICROSCOPIC
Bilirubin Urine: NEGATIVE
Glucose, UA: NEGATIVE mg/dL
Hgb urine dipstick: NEGATIVE
Ketones, ur: NEGATIVE mg/dL
Leukocytes,Ua: NEGATIVE
Nitrite: NEGATIVE
Protein, ur: NEGATIVE mg/dL
Specific Gravity, Urine: 1.015 (ref 1.005–1.030)
pH: 5.5 (ref 5.0–8.0)

## 2022-03-25 LAB — I-STAT BETA HCG BLOOD, ED (MC, WL, AP ONLY): I-stat hCG, quantitative: <5 m[IU]/mL (ref <5)

## 2022-03-25 LAB — COMPREHENSIVE METABOLIC PANEL
ALT: 16 U/L (ref 0–44)
AST: 19 U/L (ref 15–41)
Albumin: 3.5 g/dL (ref 3.5–5.0)
Alkaline Phosphatase: 37 U/L — ABNORMAL LOW (ref 38–126)
Anion gap: 9 (ref 5–15)
BUN: 8 mg/dL (ref 6–20)
CO2: 25 mmol/L (ref 22–32)
Calcium: 8.7 mg/dL — ABNORMAL LOW (ref 8.9–10.3)
Chloride: 106 mmol/L (ref 98–111)
Creatinine, Ser: 0.83 mg/dL (ref 0.44–1.00)
GFR, Estimated: >60 mL/min (ref >60)
Glucose, Bld: 93 mg/dL (ref 70–99)
Potassium: 3.7 mmol/L (ref 3.5–5.1)
Sodium: 140 mmol/L (ref 135–145)
Total Bilirubin: 0.3 mg/dL (ref 0.3–1.2)
Total Protein: 6.5 g/dL (ref 6.5–8.1)

## 2022-03-25 LAB — TROPONIN I (HIGH SENSITIVITY)
Troponin I (High Sensitivity): <2 ng/L (ref <18)
Troponin I (High Sensitivity): <2 ng/L (ref <18)

## 2022-03-25 LAB — LIPASE, BLOOD: Lipase: 34 U/L (ref 11–51)

## 2022-03-25 MED ORDER — ONDANSETRON HCL 4 MG/2ML IJ SOLN
4.0000 mg | Freq: Once | INTRAMUSCULAR | Status: AC
  Administered 2022-03-25: 4 mg via INTRAVENOUS
  Filled 2022-03-25: qty 2

## 2022-03-25 MED ORDER — IOHEXOL 350 MG/ML SOLN
75.0000 mL | Freq: Once | INTRAVENOUS | Status: AC | PRN
  Administered 2022-03-25: 75 mL via INTRAVENOUS

## 2022-03-25 MED ORDER — ONDANSETRON 4 MG PO TBDP: 4.0000 mg | ORAL_TABLET | Freq: Three times a day (TID) | ORAL | 0 refills | Status: AC | PRN

## 2022-03-25 MED ORDER — SODIUM CHLORIDE 0.9 % IV BOLUS
1000.0000 mL | Freq: Once | INTRAVENOUS | Status: AC
  Administered 2022-03-25: 1000 mL via INTRAVENOUS

## 2022-03-25 NOTE — ED Provider Notes (Signed)
  Physical Exam  BP 103/64   Pulse (!) 51   Temp 98.2 F (36.8 C) (Oral)   Resp 16   Ht 5' 10"$  (1.778 m)   Wt 81.6 kg   LMP 01/29/2022   SpO2 100%   BMI 25.83 kg/m   Physical Exam  Procedures  Procedures  ED Course / MDM   Clinical Course as of 03/25/22 1504  Mon Mar 25, 2022  1114 Re-evaluated and noted improvement of her symptoms with treatment regimen in the ED. [SB]    Clinical Course User Index [SB] Blue, Soijett A, PA-C   Medical Decision Making Amount and/or Complexity of Data Reviewed Labs: ordered. Radiology: ordered.  Risk Prescription drug management.   ***

## 2022-03-25 NOTE — ED Notes (Signed)
PT left AMA

## 2022-03-25 NOTE — ED Notes (Signed)
Pt sts she has to go and pick up her kids. Pts IV removed from arm

## 2022-03-25 NOTE — ED Notes (Signed)
Patient transported to CT 

## 2022-03-25 NOTE — ED Provider Notes (Signed)
[facility] Provider Note   CSN: [id number] Arrival date & time: 03/25/22  [id number]     History  Chief Complaint  Patient presents with   Abdominal Pain    Nicole Mahoney is a 42 y.o. female who presents emergency department with concerns for upper abdominal pain x 3 days.  Has associated nausea, vomiting, watery green diarrhea, rhinorrhea, nasal congestion.  Also notes concerns for sternal chest wall pain.  Notes her chest wall pain is worse with inspiration and coughing.  Her chest wall pain has been ongoing for the past week.  Nicole Mahoney has a history of asthma.  Denies history of COPD, MI, stents, cardiac catheterization, hypertension, diabetes.  No sick contacts.  No meds tried.  Patient is a everyday marijuana user.  Denies fever, urinary symptoms.  The history is provided by the patient. No language interpreter was used.       Home Medications Prior to Admission medications   Medication Sig Start Date End Date Taking? Authorizing Provider  acetaminophen (TYLENOL) 650 MG CR tablet Take 650 mg by mouth every 8 (eight) hours as needed for pain.    [provider]  benzonatate (TESSALON) 100 MG capsule Take 1 capsule (100 mg total) by mouth every 8 (eight) hours. 04/29/21   Harris, Vernie Shanks, PA-C  cephALEXin (KEFLEX) 500 MG capsule Take 1 capsule (500 mg total) by mouth 3 (three) times daily. 07/11/18   Paulette Blanch, MD  doxycycline (VIBRAMYCIN) 100 MG capsule Take 1 capsule (100 mg total) by mouth 2 (two) times daily. One po bid x 7 days 08/26/19   Milton Ferguson, MD  methylPREDNISolone (MEDROL DOSEPAK) 4 MG TBPK tablet Use as directed 04/29/21   Margarita Mail, PA-C  naproxen (NAPROSYN) 375 MG tablet Take 1 tablet (375 mg total) by mouth 2 (two) times daily with a meal. 04/29/21   Harris, Abigail, PA-C  ondansetron (ZOFRAN ODT) 4 MG disintegrating tablet Take 1 tablet (4 mg total) by mouth every 8 (eight) hours as needed for nausea or  vomiting. 03/25/22   [place] A, PA-C  prednisoLONE acetate (PRED FORTE) 1 % ophthalmic suspension Place 1 drop into the left eye 4 (four) times daily. 03/29/18   Recardo Evangelist, PA-C      Allergies    Penicillins and Percocet [oxycodone-acetaminophen]    Review of Systems   Review of Systems  Gastrointestinal:  Positive for abdominal pain.  All other systems reviewed and are negative.   Physical Exam Updated Vital Signs BP 103/64   Pulse (!) 51   Temp 98.2 F (36.8 C) (Oral)   Resp 16   Ht 5' 10"$  (1.778 m)   Wt 81.6 kg   LMP 01/29/2022   SpO2 100%   BMI 25.83 kg/m  Physical Exam Vitals and nursing note reviewed.  Constitutional:      General: Nicole Mahoney is not in acute distress.    Appearance: Nicole Mahoney is not diaphoretic.  HENT:     Head: Normocephalic and atraumatic.     Mouth/Throat:     Pharynx: No oropharyngeal exudate.  Eyes:     General: No scleral icterus.    Conjunctiva/sclera: Conjunctivae normal.  Cardiovascular:     Rate and Rhythm: Normal rate and regular rhythm.     Pulses: Normal pulses.     Heart sounds: Normal heart sounds.  Pulmonary:     Effort: Pulmonary effort is normal. No respiratory distress.     Breath sounds: Normal breath  sounds. No wheezing.  Chest:     Chest wall: Tenderness present.     Comments: Diffuse chest wall tenderness to palpation without overlying skin changes. Abdominal:     General: Bowel sounds are normal.     Palpations: Abdomen is soft. There is no mass.     Tenderness: There is generalized abdominal tenderness. There is no guarding or rebound.     Comments: Diffuse abdominal tenderness to palpation.  Musculoskeletal:        General: Normal range of motion.     Cervical back: Normal range of motion and neck supple.  Skin:    General: Skin is warm and dry.  Neurological:     Mental Status: Nicole Mahoney is alert.  Psychiatric:        Behavior: Behavior normal.     ED Results / Procedures / Treatments   Labs (all labs  ordered are listed, but only abnormal results are displayed) Labs Reviewed  COMPREHENSIVE METABOLIC PANEL - Abnormal; Notable for the following components:      Result Value   Calcium 8.7 (*)    Alkaline Phosphatase 37 (*)    All other components within normal limits  RESP PANEL BY RT-PCR (RSV, FLU A&B, COVID)  RVPGX2  CBC WITH DIFFERENTIAL/PLATELET  LIPASE, BLOOD  URINALYSIS, ROUTINE W REFLEX MICROSCOPIC  I-STAT BETA HCG BLOOD, ED ([facility], [facility], [facility] ONLY)  TROPONIN I (HIGH SENSITIVITY)  TROPONIN I (HIGH SENSITIVITY)  TROPONIN I (HIGH SENSITIVITY)    EKG None  Radiology CT ABDOMEN PELVIS W CONTRAST  Result Date: 03/25/2022 CLINICAL DATA:  Acute non-localized abdominal pain. Previous appendectomy. EXAM: CT ABDOMEN AND PELVIS WITH CONTRAST TECHNIQUE: Multidetector CT imaging of the abdomen and pelvis was performed using the standard protocol following bolus administration of intravenous contrast. RADIATION DOSE REDUCTION: This exam was performed according to the departmental dose-optimization program which includes automated exposure control, adjustment of the mA and/or kV according to patient size and/or use of iterative reconstruction technique. CONTRAST:  39m OMNIPAQUE IOHEXOL 350 MG/ML SOLN COMPARISON:  02/16/2021 FINDINGS: Lower Chest: No acute findings. Hepatobiliary: Poorly defined low-attenuation lesion is seen in the lateral right hepatic lobe measuring approximately 1.7 cm on image 18/3. This was not definitely seen on prior study, although prior study was degraded by beam hardening artifact. Other liver lesions identified. Gallbladder is unremarkable. No evidence of biliary ductal dilatation. Pancreas:  No mass or inflammatory changes. Spleen: Within normal limits in size and appearance. Adrenals/Urinary Tract: No suspicious masses identified. No evidence of ureteral calculi or hydronephrosis. Stomach/Bowel: No evidence of obstruction, inflammatory process or abnormal fluid collections.  Vascular/Lymphatic: No pathologically enlarged lymph nodes. No acute vascular findings. Reproductive:  No mass or other significant abnormality. Other:  None. Musculoskeletal:  No suspicious bone lesions identified. IMPRESSION: No acute findings. 1.7 cm indeterminate lesion in lateral right hepatic lobe. Nonemergent outpatient abdomen MRI without and with contrast is recommended for further characterization. Electronically Signed   By: JMarlaine HindM.D.   On: 03/25/2022 12:35   DG Chest 2 View  Result Date: 03/25/2022 CLINICAL DATA:  Chest wall pain with inspiration. Shortness bracket productive cough for 1 week. EXAM: CHEST - 2 VIEW COMPARISON:  Chest radiographs 04/29/2021 FINDINGS: Cardiac silhouette and mediastinal contours are within normal limits. The lungs are clear. No pleural effusion or pneumothorax. No acute skeletal abnormality. IMPRESSION: No active cardiopulmonary disease. Electronically Signed   By: RYvonne KendallM.D.   On: 03/25/2022 10:31    Procedures Procedures    Medications  Ordered in ED Medications  sodium chloride 0.9 % bolus 1,000 mL (0 mLs Intravenous Stopped 03/25/22 1415)  ondansetron (ZOFRAN) injection 4 mg (4 mg Intravenous Given 03/25/22 1000)  iohexol (OMNIPAQUE) 350 MG/ML injection 75 mL (75 mLs Intravenous Contrast Given 03/25/22 1220)    ED Course/ Medical Decision Making/ A&P Clinical Course as of 03/25/22 1523  Mon Mar 25, 2022  1114 Re-evaluated and noted improvement of her symptoms with treatment regimen in the ED. [SB]  1518 Patient reevaluated and asleep on stretcher.  Discussed with patient negative findings on her lab work.  Patient without episodes of nausea or emesis in the emergency department today.  Instructed patient for urine prior to discharge.  Patient agreeable at this time. [SB]    Clinical Course User Index [SB] Lynnet Hefley A, PA-C                             Medical Decision Making Amount and/or Complexity of Data Reviewed Labs:  ordered. Radiology: ordered.  Risk Prescription drug management.   Patient presents to the emergency department with abdominal pain, nausea, vomiting, diarrhea x 3 days. On exam patient with diffuse abdominal tenderness to palpation.  No focal abdominal pain remainder of exam without acute findings.  Pt afebrile. Differential diagnosis includes pancreatitis, cholecystitis, appendicitis, UTI, GERD, ACS, COVID, flu, RSV cannabinoid hyperemesis syndrome.    Labs:  I ordered, and personally interpreted labs.  The pertinent results include:  Urinalysis ordered with results pending at time of signout. CBC and CMP overall unremarkable. Negative lipase. Negative hCG. Negative COVID, flu, RSV swab. Initial and delta troponin less than 2  Imaging: I ordered imaging studies including Chest x-ray CT abdomen pelvis with I independently visualized and interpreted imaging which showed chest x-ray without acute findings. CT abdomen pelvis with  No acute findings.    1.7 cm indeterminate lesion in lateral right hepatic lobe.   I agree with the radiologist interpretation  Medications:  I ordered medication including IVF and Zofran for symptom management.  Reevaluation of the patient after these medicines and interventions, I reevaluated the patient and found that they have improved I have reviewed the patients home medicines and have made adjustments as needed  Patient case discussed with Dr. Burt Ek, at sign-out. Plan at sign-out is pending UA, likely discharge home, however, plans may change as per oncoming team. Patient care transferred at sign out.    This chart was dictated using voice recognition software, Dragon. Despite the best efforts of this provider to proofread and correct errors, errors may still occur which can change documentation meaning.   Final Clinical Impression(s) / ED Diagnoses Final diagnoses:  Nausea vomiting and diarrhea  Upper abdominal pain    Rx / DC  Orders ED Discharge Orders          Ordered    ondansetron (ZOFRAN ODT) 4 MG disintegrating tablet  Every 8 hours PRN        03/25/22 1523              Tiahna Cure A, PA-C 03/25/22 1523    Carmin Muskrat, MD 03/25/22 1554

## 2022-03-25 NOTE — ED Triage Notes (Signed)
Pt c/o N/V/D x 3 days, as well as upper abd pain. Denies sick contacts

## 2022-03-25 NOTE — Discharge Instructions (Signed)
It was a pleasure take care of you today!  Your labs did not show any concerning findings today.  Your urine showed   Your chest x-ray did not show any concerning findings today.  Your CT scan showed an incidental finding of a lesion to the liver.  This can be follow-up with your primary care provider for outpatient imaging.  If you do not have a primary care provider attached is information for health connect for you they establish care with a primary care provider.

## 2022-04-06 ENCOUNTER — Encounter (HOSPITAL_COMMUNITY): Payer: Self-pay

## 2022-04-06 ENCOUNTER — Emergency Department (HOSPITAL_COMMUNITY)
Admission: EM | Admit: 2022-04-06 | Discharge: 2022-04-07 | Disposition: A | Discharge status: Home / Self Care | Payer: Medicaid Other | Attending: Emergency Medicine | Admitting: Emergency Medicine

## 2022-04-06 ENCOUNTER — Other Ambulatory Visit: Payer: Self-pay

## 2022-04-06 DIAGNOSIS — J069 Acute upper respiratory infection, unspecified: Secondary | ICD-10-CM | POA: Diagnosis not present

## 2022-04-06 DIAGNOSIS — R0981 Nasal congestion: Secondary | ICD-10-CM | POA: Diagnosis present

## 2022-04-06 NOTE — ED Triage Notes (Signed)
Sts she has had ongoing nasal congestion and sinus pressure for several days. Blew nose and had a large blood clot come out.   Says she was here several weeks ago but had to leave early to get kids and was unsure of results.

## 2022-04-07 ENCOUNTER — Emergency Department (HOSPITAL_COMMUNITY): Payer: Medicaid Other

## 2022-04-07 MED ORDER — OXYMETAZOLINE HCL 0.05 % NA SOLN
1.0000 | Freq: Once | NASAL | Status: AC
  Administered 2022-04-07: 1 via NASAL
  Filled 2022-04-07: qty 30

## 2022-04-07 MED ORDER — KETOROLAC TROMETHAMINE 60 MG/2ML IM SOLN
60.0000 mg | Freq: Once | INTRAMUSCULAR | Status: AC
  Administered 2022-04-07: 60 mg via INTRAMUSCULAR
  Filled 2022-04-07: qty 2

## 2022-04-07 NOTE — ED Provider Notes (Signed)
[facility] Provider Note   CSN: [id number] Arrival date & time: 04/06/22  2235     History  Chief Complaint  Patient presents with   Nasal Congestion    Nicole Mahoney is a 42 y.o. female.  The history is provided by the patient and medical records.  Nicole Mahoney is a 41 y.o. female who presents to the Emergency Department complaining of nasal congestion that started 2 days ago.  She blew her nose and had a large clot come from her right nare.  She also reports 2 days of cough and congestion with sinus pressure, subjective fever, nausea and 1 episode of green watery diarrhea.  She was seen in the emergency department 2 weeks ago and left prior to completing her care.  She has no known medical problems and takes no routine medications.  No history of DVT/PE.  She did use cocaine in the last 2 weeks by snorting.     Home Medications Prior to Admission medications   Medication Sig Start Date End Date Taking? Authorizing Provider  acetaminophen (TYLENOL) 650 MG CR tablet Take 650 mg by mouth every 8 (eight) hours as needed for pain.    [provider]  benzonatate (TESSALON) 100 MG capsule Take 1 capsule (100 mg total) by mouth every 8 (eight) hours. 04/29/21   Harris, Vernie Shanks, PA-C  cephALEXin (KEFLEX) 500 MG capsule Take 1 capsule (500 mg total) by mouth 3 (three) times daily. 07/11/18   Paulette Blanch, MD  doxycycline (VIBRAMYCIN) 100 MG capsule Take 1 capsule (100 mg total) by mouth 2 (two) times daily. One po bid x 7 days 08/26/19   Milton Ferguson, MD  methylPREDNISolone (MEDROL DOSEPAK) 4 MG TBPK tablet Use as directed 04/29/21   Margarita Mail, PA-C  naproxen (NAPROSYN) 375 MG tablet Take 1 tablet (375 mg total) by mouth 2 (two) times daily with a meal. 04/29/21   Harris, Abigail, PA-C  ondansetron (ZOFRAN ODT) 4 MG disintegrating tablet Take 1 tablet (4 mg total) by mouth every 8 (eight) hours as needed for nausea or vomiting.  03/25/22   Blue, Soijett A, PA-C  prednisoLONE acetate (PRED FORTE) 1 % ophthalmic suspension Place 1 drop into the left eye 4 (four) times daily. 03/29/18   Recardo Evangelist, PA-C      Allergies    Penicillins and Percocet [oxycodone-acetaminophen]    Review of Systems   Review of Systems  All other systems reviewed and are negative.   Physical Exam Updated Vital Signs BP (!) 140/98 (BP Location: Right Arm)   Pulse 75   Temp 98.3 F (36.8 C) (Oral)   Resp 16   Ht '5\' 10"'$  (1.778 m)   Wt 81.6 kg   LMP 01/29/2022   SpO2 98%   BMI 25.83 kg/m  Physical Exam Vitals and nursing note reviewed.  Constitutional:      Appearance: She is well-developed.  HENT:     Head: Normocephalic and atraumatic.     Comments: No blood in the naris bilaterally.  Watery eyes bilaterally with mild conjunctival injection no erythema in the posterior oropharynx Cardiovascular:     Rate and Rhythm: Normal rate and regular rhythm.     Heart sounds: No murmur heard. Pulmonary:     Effort: Pulmonary effort is normal. No respiratory distress.     Breath sounds: Normal breath sounds.  Abdominal:     Palpations: Abdomen is soft.     Tenderness: There  is no abdominal tenderness. There is no guarding or rebound.  Musculoskeletal:        General: No swelling or tenderness.  Skin:    General: Skin is warm and dry.  Neurological:     Mental Status: She is alert and oriented to person, place, and time.  Psychiatric:        Behavior: Behavior normal.     ED Results / Procedures / Treatments   Labs (all labs ordered are listed, but only abnormal results are displayed) Labs Reviewed - No data to display  EKG None  Radiology DG Chest 2 View  Result Date: 04/07/2022 CLINICAL DATA:  Cough EXAM: CHEST - 2 VIEW COMPARISON:  03/25/22 FINDINGS: The heart size and mediastinal contours are within normal limits. Both lungs are clear. The visualized skeletal structures are unremarkable. IMPRESSION: No active  cardiopulmonary disease. Electronically Signed   By: Inez Catalina M.D.   On: 04/07/2022 02:05    Procedures Procedures    Medications Ordered in ED Medications  oxymetazoline (AFRIN) 0.05 % nasal spray 1 spray (1 spray Each Nare Given 04/07/22 0101)  ketorolac (TORADOL) injection 60 mg (60 mg Intramuscular Given 04/07/22 0112)    ED Course/ Medical Decision Making/ A&P                             Medical Decision Making Amount and/or Complexity of Data Reviewed Radiology: ordered.  Risk OTC drugs. Prescription drug management.   Patient here for episode of epistaxis yesterday that self resolved, cough, subjective fever, 1 episode of diarrhea and nausea.  She is nontoxic-appearing on evaluation with no respiratory distress.  Lungs are clear bilaterally.  Chest x-ray is negative for acute infiltrate or abnormality-images personally reviewed and interpreted, agree with radiologist interpretation.  No evidence of active nosebleed.  She recently had labs performed on an ED stay on February 19, with reassuring CBC, CMP.  Current clinical picture is consistent with viral URI.  Discussed home care with Afrin for nasal congestion for up to 3 days with over-the-counter decongestions.  Patient has nausea medicine available at home.  Doubt acute PE, sepsis.  Discussed outpatient care, follow-up and return precautions.        Final Clinical Impression(s) / ED Diagnoses Final diagnoses:  Nasal congestion  Viral upper respiratory tract infection    Rx / DC Orders ED Discharge Orders     None         Quintella Reichert, MD 04/07/22 [phone]

## 2022-04-07 NOTE — Discharge Instructions (Signed)
You can use the afrin nasal spray, one spray in each nostril twice a day for three days max.

## 2022-07-01 ENCOUNTER — Other Ambulatory Visit: Payer: Self-pay

## 2022-07-01 ENCOUNTER — Emergency Department (HOSPITAL_COMMUNITY): Payer: Medicaid Other

## 2022-07-01 ENCOUNTER — Emergency Department (HOSPITAL_COMMUNITY)
Admission: EM | Admit: 2022-07-01 | Discharge: 2022-07-01 | Disposition: A | Discharge status: Home / Self Care | Payer: Medicaid Other | Attending: Emergency Medicine | Admitting: Emergency Medicine

## 2022-07-01 DIAGNOSIS — F1490 Cocaine use, unspecified, uncomplicated: Secondary | ICD-10-CM | POA: Diagnosis not present

## 2022-07-01 DIAGNOSIS — R000 Tachycardia, unspecified: Secondary | ICD-10-CM | POA: Insufficient documentation

## 2022-07-01 DIAGNOSIS — E876 Hypokalemia: Secondary | ICD-10-CM | POA: Diagnosis not present

## 2022-07-01 DIAGNOSIS — I10 Essential (primary) hypertension: Secondary | ICD-10-CM | POA: Diagnosis not present

## 2022-07-01 DIAGNOSIS — G9340 Encephalopathy, unspecified: Secondary | ICD-10-CM

## 2022-07-01 DIAGNOSIS — R109 Unspecified abdominal pain: Secondary | ICD-10-CM | POA: Diagnosis not present

## 2022-07-01 DIAGNOSIS — M549 Dorsalgia, unspecified: Secondary | ICD-10-CM | POA: Diagnosis not present

## 2022-07-01 DIAGNOSIS — W19XXXA Unspecified fall, initial encounter: Secondary | ICD-10-CM

## 2022-07-01 LAB — CBC WITH DIFFERENTIAL/PLATELET
Abs Immature Granulocytes: 0.01 10*3/uL (ref 0.00–0.07)
Basophils Absolute: 0 10*3/uL (ref 0.0–0.1)
Basophils Relative: 0 %
Eosinophils Absolute: 0.2 10*3/uL (ref 0.0–0.5)
Eosinophils Relative: 4 %
HCT: 31.8 % — ABNORMAL LOW (ref 36.0–46.0)
Hemoglobin: 10.2 g/dL — ABNORMAL LOW (ref 12.0–15.0)
Immature Granulocytes: 0 %
Lymphocytes Relative: 28 %
Lymphs Abs: 1.4 10*3/uL (ref 0.7–4.0)
MCH: 30 pg (ref 26.0–34.0)
MCHC: 32.1 g/dL (ref 30.0–36.0)
MCV: 93.5 fL (ref 80.0–100.0)
Monocytes Absolute: 0.8 10*3/uL (ref 0.1–1.0)
Monocytes Relative: 16 %
Neutro Abs: 2.7 10*3/uL (ref 1.7–7.7)
Neutrophils Relative %: 52 %
Platelets: 208 10*3/uL (ref 150–400)
RBC: 3.4 MIL/uL — ABNORMAL LOW (ref 3.87–5.11)
RDW: 12.7 % (ref 11.5–15.5)
WBC: 5.1 10*3/uL (ref 4.0–10.5)
nRBC: 0 % (ref 0.0–0.2)

## 2022-07-01 LAB — URINALYSIS, W/ REFLEX TO CULTURE (INFECTION SUSPECTED)
Bacteria, UA: NONE SEEN
Bilirubin Urine: NEGATIVE
Glucose, UA: NEGATIVE mg/dL
Hgb urine dipstick: NEGATIVE
Ketones, ur: 20 mg/dL — AB
Leukocytes,Ua: NEGATIVE
Nitrite: NEGATIVE
Protein, ur: NEGATIVE mg/dL
Specific Gravity, Urine: >1.046 — ABNORMAL HIGH (ref 1.005–1.030)
pH: 5 (ref 5.0–8.0)

## 2022-07-01 LAB — I-STAT CHEM 8, ED
BUN: 15 mg/dL (ref 6–20)
Calcium, Ion: 0.87 mmol/L — CL (ref 1.15–1.40)
Chloride: 108 mmol/L (ref 98–111)
Creatinine, Ser: 0.9 mg/dL (ref 0.44–1.00)
Glucose, Bld: 100 mg/dL — ABNORMAL HIGH (ref 70–99)
HCT: 31 % — ABNORMAL LOW (ref 36.0–46.0)
Hemoglobin: 10.5 g/dL — ABNORMAL LOW (ref 12.0–15.0)
Potassium: 3.2 mmol/L — ABNORMAL LOW (ref 3.5–5.1)
Sodium: 140 mmol/L (ref 135–145)
TCO2: 22 mmol/L (ref 22–32)

## 2022-07-01 LAB — COMPREHENSIVE METABOLIC PANEL
ALT: 18 U/L (ref 0–44)
AST: 20 U/L (ref 15–41)
Albumin: 3.9 g/dL (ref 3.5–5.0)
Alkaline Phosphatase: 41 U/L (ref 38–126)
Anion gap: 12 (ref 5–15)
BUN: 15 mg/dL (ref 6–20)
CO2: 22 mmol/L (ref 22–32)
Calcium: 8.1 mg/dL — ABNORMAL LOW (ref 8.9–10.3)
Chloride: 105 mmol/L (ref 98–111)
Creatinine, Ser: 0.96 mg/dL (ref 0.44–1.00)
GFR, Estimated: >60 mL/min (ref >60)
Glucose, Bld: 100 mg/dL — ABNORMAL HIGH (ref 70–99)
Potassium: 3.2 mmol/L — ABNORMAL LOW (ref 3.5–5.1)
Sodium: 139 mmol/L (ref 135–145)
Total Bilirubin: 0.3 mg/dL (ref 0.3–1.2)
Total Protein: 7 g/dL (ref 6.5–8.1)

## 2022-07-01 LAB — RAPID URINE DRUG SCREEN, HOSP PERFORMED
Amphetamines: NOT DETECTED
Barbiturates: NOT DETECTED
Benzodiazepines: NOT DETECTED
Cocaine: POSITIVE — AB
Opiates: NOT DETECTED
Tetrahydrocannabinol: NOT DETECTED

## 2022-07-01 LAB — ETHANOL: Alcohol, Ethyl (B): <10 mg/dL (ref <10)

## 2022-07-01 LAB — LIPASE, BLOOD: Lipase: 27 U/L (ref 11–51)

## 2022-07-01 LAB — I-STAT BETA HCG BLOOD, ED (MC, WL, AP ONLY): I-stat hCG, quantitative: <5 m[IU]/mL (ref <5)

## 2022-07-01 MED ORDER — SODIUM CHLORIDE 0.9 % IV BOLUS
1000.0000 mL | Freq: Once | INTRAVENOUS | Status: AC
  Administered 2022-07-01: 1000 mL via INTRAVENOUS

## 2022-07-01 MED ORDER — POTASSIUM CHLORIDE CRYS ER 20 MEQ PO TBCR
40.0000 meq | EXTENDED_RELEASE_TABLET | Freq: Once | ORAL | Status: AC
  Administered 2022-07-01: 40 meq via ORAL
  Filled 2022-07-01: qty 2

## 2022-07-01 MED ORDER — HYDROMORPHONE HCL 1 MG/ML IJ SOLN
1.0000 mg | Freq: Once | INTRAMUSCULAR | Status: AC
  Administered 2022-07-01: 1 mg via INTRAVENOUS
  Filled 2022-07-01: qty 1

## 2022-07-01 MED ORDER — IOHEXOL 350 MG/ML SOLN
75.0000 mL | Freq: Once | INTRAVENOUS | Status: AC | PRN
  Administered 2022-07-01: 75 mL via INTRAVENOUS

## 2022-07-01 MED ORDER — DROPERIDOL 2.5 MG/ML IJ SOLN
2.5000 mg | Freq: Once | INTRAMUSCULAR | Status: AC
  Administered 2022-07-01: 2.5 mg via INTRAVENOUS
  Filled 2022-07-01: qty 2
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
## 2022-07-01 NOTE — ED Notes (Addendum)
Pt ambulatory and able to tolerate PO intake prior to departure (drinks gingerale and eats a [place] sandwich prior to departure)  Patient verbalizes understanding of discharge instructions. Opportunity for questioning and answers were provided. Armband removed by staff, pt discharged from ED to home via bus - voucher provided.

## 2022-07-01 NOTE — ED Notes (Signed)
Patient transported to CT 

## 2022-07-01 NOTE — ED Notes (Signed)
PT refused to get her blood drawn at this time

## 2022-07-01 NOTE — Discharge Instructions (Addendum)
Your workup was in from the fall.  Your potassium was a little low but can be supplemented with a good diet.
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
## 2022-07-01 NOTE — ED Provider Notes (Signed)
  Physical Exam  BP (!) 146/85   Pulse 65   Temp 98.2 F (36.8 C) (Oral)   Resp 17   Ht 5\' 5"  (1.651 m)   Wt 79.4 kg   SpO2 100%   BMI 29.12 kg/m   Physical Exam  Procedures  Procedures  ED Course / MDM   Clinical Course as of 07/01/22 1539  Mon Jul 01, 2022  1025 Patient was given Dilaudid to try and help relieve pain and get a better history.  Unfortunately she still will not cooperate with history or exam.  Unclear if this is trauma, substance abuse, or still just related to pain.  Will get trauma type CT scans and will give a dose of droperidol to see if this helps calm her down better. Last QTC from a couple months ago was 430. [SG]    Clinical Course User Index [SG] Pricilla Loveless, MD   Medical Decision Making Amount and/or Complexity of Data Reviewed Labs: ordered. Radiology: ordered.  Risk Prescription drug management.   Received in signout.  Mental status change.  Sedate now.  Wait for her to wake up. Patient eating and much more awake.  I think likely secondary to substance use.  States she did come in initially because of worms in her stool.  More appropriate now and will discharge.       Benjiman Core, MD 07/01/22 2234
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
## 2022-07-01 NOTE — ED Notes (Signed)
Unable to assess pt's pain level. PT is still moving around on bed, yelling and not answering questions.

## 2022-09-27 ENCOUNTER — Encounter (HOSPITAL_COMMUNITY): Payer: Self-pay

## 2022-09-27 ENCOUNTER — Emergency Department (HOSPITAL_COMMUNITY): Payer: Medicaid Other

## 2022-09-27 ENCOUNTER — Emergency Department (HOSPITAL_COMMUNITY)
Admission: EM | Admit: 2022-09-27 | Discharge: 2022-09-27 | Disposition: A | Discharge status: Home / Self Care | Payer: Medicaid Other | Attending: Emergency Medicine | Admitting: Emergency Medicine

## 2022-09-27 ENCOUNTER — Other Ambulatory Visit: Payer: Self-pay

## 2022-09-27 DIAGNOSIS — S51812A Laceration without foreign body of left forearm, initial encounter: Secondary | ICD-10-CM | POA: Insufficient documentation

## 2022-09-27 DIAGNOSIS — S60812A Abrasion of left wrist, initial encounter: Secondary | ICD-10-CM | POA: Diagnosis not present

## 2022-09-27 DIAGNOSIS — Z23 Encounter for immunization: Secondary | ICD-10-CM | POA: Diagnosis not present

## 2022-09-27 LAB — TYPE AND SCREEN
ABO/RH(D): O POS
Antibody Screen: NEGATIVE

## 2022-09-27 LAB — COMPREHENSIVE METABOLIC PANEL
ALT: 13 U/L (ref 0–44)
AST: 12 U/L — ABNORMAL LOW (ref 15–41)
Albumin: 4 g/dL (ref 3.5–5.0)
Alkaline Phosphatase: 49 U/L (ref 38–126)
Anion gap: 13 (ref 5–15)
BUN: 8 mg/dL (ref 6–20)
CO2: 19 mmol/L — ABNORMAL LOW (ref 22–32)
Calcium: 8.6 mg/dL — ABNORMAL LOW (ref 8.9–10.3)
Chloride: 105 mmol/L (ref 98–111)
Creatinine, Ser: 1.1 mg/dL — ABNORMAL HIGH (ref 0.44–1.00)
GFR, Estimated: >60 mL/min (ref >60)
Glucose, Bld: 115 mg/dL — ABNORMAL HIGH (ref 70–99)
Potassium: 3.4 mmol/L — ABNORMAL LOW (ref 3.5–5.1)
Sodium: 137 mmol/L (ref 135–145)
Total Bilirubin: 0.6 mg/dL (ref 0.3–1.2)
Total Protein: 7.6 g/dL (ref 6.5–8.1)

## 2022-09-27 LAB — I-STAT CHEM 8, ED
BUN: 8 mg/dL (ref 6–20)
Calcium, Ion: 1.08 mmol/L — ABNORMAL LOW (ref 1.15–1.40)
Chloride: 106 mmol/L (ref 98–111)
Creatinine, Ser: 0.9 mg/dL (ref 0.44–1.00)
Glucose, Bld: 115 mg/dL — ABNORMAL HIGH (ref 70–99)
HCT: 41 % (ref 36.0–46.0)
Hemoglobin: 13.9 g/dL (ref 12.0–15.0)
Potassium: 3.5 mmol/L (ref 3.5–5.1)
Sodium: 140 mmol/L (ref 135–145)
TCO2: 20 mmol/L — ABNORMAL LOW (ref 22–32)

## 2022-09-27 LAB — CBC
HCT: 40 % (ref 36.0–46.0)
Hemoglobin: 12.8 g/dL (ref 12.0–15.0)
MCH: 29.6 pg (ref 26.0–34.0)
MCHC: 32 g/dL (ref 30.0–36.0)
MCV: 92.4 fL (ref 80.0–100.0)
Platelets: 241 10*3/uL (ref 150–400)
RBC: 4.33 MIL/uL (ref 3.87–5.11)
RDW: 13.4 % (ref 11.5–15.5)
WBC: 7.1 10*3/uL (ref 4.0–10.5)
nRBC: 0 % (ref 0.0–0.2)

## 2022-09-27 LAB — I-STAT CG4 LACTIC ACID, ED: Lactic Acid, Venous: 2.1 mmol/L (ref 0.5–1.9)

## 2022-09-27 LAB — ETHANOL: Alcohol, Ethyl (B): 43 mg/dL — ABNORMAL HIGH (ref <10)

## 2022-09-27 LAB — PROTIME-INR
INR: 0.9 (ref 0.8–1.2)
Prothrombin Time: 11.9 seconds (ref 11.4–15.2)

## 2022-09-27 LAB — ABO/RH: ABO/RH(D): O POS

## 2022-09-27 MED ORDER — LIDOCAINE HCL (PF) 1 % IJ SOLN
30.0000 mL | Freq: Once | INTRAMUSCULAR | Status: DC
  Filled 2022-09-27: qty 30

## 2022-09-27 MED ORDER — FENTANYL CITRATE PF 50 MCG/ML IJ SOSY: 50.0000 ug | PREFILLED_SYRINGE | Freq: Once | INTRAMUSCULAR | Status: DC

## 2022-09-27 MED ORDER — TETANUS-DIPHTH-ACELL PERTUSSIS 5-2.5-18.5 LF-MCG/0.5 IM SUSY
0.5000 mL | PREFILLED_SYRINGE | Freq: Once | INTRAMUSCULAR | Status: AC
  Administered 2022-09-27: 0.5 mL via INTRAMUSCULAR

## 2022-09-27 MED ORDER — FENTANYL CITRATE PF 50 MCG/ML IJ SOSY
PREFILLED_SYRINGE | INTRAMUSCULAR | Status: AC
  Filled 2022-09-27: qty 1

## 2022-09-27 MED ORDER — FENTANYL CITRATE (PF) 100 MCG/2ML IJ SOLN
INTRAMUSCULAR | Status: DC | PRN
  Administered 2022-09-27: 50 ug via INTRAVENOUS
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
## 2022-09-27 NOTE — Discharge Instructions (Addendum)
You were seen in the emergency department for your arm lacerations.  Your workup showed no signs of foreign body in your arm and no signs of obvious injuries to your tendons or ligaments.  We were able to suture your lacerations for you and we updated your tetanus for you.  Your sutures will need to be removed by your primary doctor, in urgent care or in the ER in 7 to 10 days.  You can wash your arm normally in the shower, just do not soak it under water and completely dry it when you are done.  You should return to the emergency department sooner if you notice spreading redness from the wounds, pus draining from the wounds, you have fevers or any other new or concerning symptoms.
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
## 2022-09-27 NOTE — ED Notes (Signed)
X-ray at bedside

## 2022-09-27 NOTE — ED Provider Notes (Signed)
..  Laceration Repair  Date/Time: 09/27/2022 4:02 PM  Performed by: Dyanne Iha, MD Authorized by: Rexford Maus, DO   Consent:    Consent obtained:  Verbal   Consent given by:  Patient Universal protocol:    Patient identity confirmed:  Verbally with patient, arm band and hospital-assigned identification number Anesthesia:    Anesthesia method:  Local infiltration   Local anesthetic:  Lidocaine 1% w/o epi Laceration details:    Location:  Shoulder/arm   Shoulder/arm location:  R lower arm   Length (cm):  8 (8, 6)   Depth (mm):  3 Pre-procedure details:    Preparation:  Imaging obtained to evaluate for foreign bodies Exploration:    Imaging obtained: x-ray     Imaging outcome: foreign body not noted     Wound exploration: wound explored through full range of motion and entire depth of wound visualized     Wound extent: areolar tissue not violated, fascia not violated, no foreign body, no signs of injury, no nerve damage, no tendon damage, no underlying fracture and no vascular damage     Contaminated: no   Treatment:    Area cleansed with:  Saline   Amount of cleaning:  Extensive   Irrigation solution:  Sterile saline   Irrigation volume:  500   Irrigation method:  Pressure wash   Visualized foreign bodies/material removed: no     Debridement:  None   Undermining:  None   Scar revision: no   Skin repair:    Repair method:  Sutures   Suture size:  4-0   Suture material:  Nylon   Suture technique:  Simple interrupted   Number of sutures:  12 (12 in large stellate lac, 7 in smaller lac) Approximation:    Approximation:  Close Repair type:    Repair type:  Simple Post-procedure details:    Dressing:  Antibiotic ointment and bulky dressing   Procedure completion:  Tolerated well, no immediate complications     Dyanne Iha, MD 09/27/22 1604    Elayne Snare K, DO 09/28/22 [id number]

## 2022-09-30 ENCOUNTER — Encounter (HOSPITAL_COMMUNITY): Payer: Self-pay

## 2022-10-05 ENCOUNTER — Encounter (HOSPITAL_COMMUNITY): Payer: Self-pay | Admitting: *Deleted

## 2022-10-05 ENCOUNTER — Emergency Department (HOSPITAL_COMMUNITY)
Admission: EM | Admit: 2022-10-05 | Discharge: 2022-10-05 | Disposition: A | Discharge status: Home / Self Care | Payer: Medicaid Other | Source: Home / Self Care | Attending: Emergency Medicine | Admitting: Emergency Medicine

## 2022-10-05 ENCOUNTER — Other Ambulatory Visit: Payer: Self-pay

## 2022-10-05 ENCOUNTER — Emergency Department (HOSPITAL_COMMUNITY): Payer: Medicaid Other

## 2022-10-05 DIAGNOSIS — M79602 Pain in left arm: Secondary | ICD-10-CM | POA: Insufficient documentation

## 2022-10-05 DIAGNOSIS — Z4802 Encounter for removal of sutures: Secondary | ICD-10-CM | POA: Insufficient documentation

## 2022-10-05 LAB — CBC WITH DIFFERENTIAL/PLATELET
Abs Immature Granulocytes: 0.01 10*3/uL (ref 0.00–0.07)
Basophils Absolute: 0 10*3/uL (ref 0.0–0.1)
Basophils Relative: 1 %
Eosinophils Absolute: 0.2 10*3/uL (ref 0.0–0.5)
Eosinophils Relative: 2 %
HCT: 37.1 % (ref 36.0–46.0)
Hemoglobin: 11.9 g/dL — ABNORMAL LOW (ref 12.0–15.0)
Immature Granulocytes: 0 %
Lymphocytes Relative: 32 %
Lymphs Abs: 2.4 10*3/uL (ref 0.7–4.0)
MCH: 29.3 pg (ref 26.0–34.0)
MCHC: 32.1 g/dL (ref 30.0–36.0)
MCV: 91.4 fL (ref 80.0–100.0)
Monocytes Absolute: 0.5 10*3/uL (ref 0.1–1.0)
Monocytes Relative: 7 %
Neutro Abs: 4.4 10*3/uL (ref 1.7–7.7)
Neutrophils Relative %: 58 %
Platelets: 257 10*3/uL (ref 150–400)
RBC: 4.06 MIL/uL (ref 3.87–5.11)
RDW: 13.6 % (ref 11.5–15.5)
WBC: 7.5 10*3/uL (ref 4.0–10.5)
nRBC: 0 % (ref 0.0–0.2)

## 2022-10-05 LAB — HCG, SERUM, QUALITATIVE: Preg, Serum: NEGATIVE

## 2022-10-05 LAB — BASIC METABOLIC PANEL
Anion gap: 12 (ref 5–15)
BUN: 11 mg/dL (ref 6–20)
CO2: 23 mmol/L (ref 22–32)
Calcium: 8.9 mg/dL (ref 8.9–10.3)
Chloride: 105 mmol/L (ref 98–111)
Creatinine, Ser: 0.75 mg/dL (ref 0.44–1.00)
GFR, Estimated: >60 mL/min (ref >60)
Glucose, Bld: 95 mg/dL (ref 70–99)
Potassium: 3.6 mmol/L (ref 3.5–5.1)
Sodium: 140 mmol/L (ref 135–145)

## 2022-10-05 LAB — CK: Total CK: 84 U/L (ref 38–234)

## 2022-10-05 MED ORDER — APIXABAN 5 MG PO TABS
10.0000 mg | ORAL_TABLET | Freq: Once | ORAL | Status: AC
  Administered 2022-10-05: 10 mg via ORAL
  Filled 2022-10-05: qty 2

## 2022-10-05 MED ORDER — FENTANYL CITRATE PF 50 MCG/ML IJ SOSY: 50.0000 ug | PREFILLED_SYRINGE | Freq: Once | INTRAMUSCULAR | Status: DC

## 2022-10-05 NOTE — ED Provider Notes (Signed)
  [place] EMERGENCY DEPARTMENT AT [facility] Provider Note   CSN: [id number] Arrival date & time: 10/05/22  1635     History {Add pertinent medical, surgical, social history, OB history to HPI:1} Chief Complaint  Patient presents with   Suture / Staple Removal    Nicole Mahoney is a 42 y.o. female.   Suture / Staple Removal       Home Medications Prior to Admission medications   Medication Sig Start Date End Date Taking? Authorizing Provider  acetaminophen (TYLENOL) 650 MG CR tablet Take 650 mg by mouth every 8 (eight) hours as needed for pain.    [provider]  benzonatate (TESSALON) 100 MG capsule Take 1 capsule (100 mg total) by mouth every 8 (eight) hours. 04/29/21   Harris, Cammy Copa, PA-C  cephALEXin (KEFLEX) 500 MG capsule Take 1 capsule (500 mg total) by mouth 3 (three) times daily. 07/11/18   Irean Hong, MD  doxycycline (VIBRAMYCIN) 100 MG capsule Take 1 capsule (100 mg total) by mouth 2 (two) times daily. One po bid x 7 days 08/26/19   Bethann Berkshire, MD  methylPREDNISolone (MEDROL DOSEPAK) 4 MG TBPK tablet Use as directed 04/29/21   Arthor Captain, PA-C  naproxen (NAPROSYN) 375 MG tablet Take 1 tablet (375 mg total) by mouth 2 (two) times daily with a meal. 04/29/21   Harris, Abigail, PA-C  ondansetron (ZOFRAN ODT) 4 MG disintegrating tablet Take 1 tablet (4 mg total) by mouth every 8 (eight) hours as needed for nausea or vomiting. 03/25/22   Blue, Soijett A, PA-C  prednisoLONE acetate (PRED FORTE) 1 % ophthalmic suspension Place 1 drop into the left eye 4 (four) times daily. 03/29/18   Bethel Born, PA-C      Allergies    Penicillins, Penicillins, and Percocet [oxycodone-acetaminophen]    Review of Systems   Review of Systems  Physical Exam Updated Vital Signs BP (!) 170/89 (BP Location: Right Arm)   Pulse 95   Temp 99.3 F (37.4 C) (Oral)   Resp 16   Ht 5\' 7"  (1.702 m)   Wt 79.4 kg   LMP  (LMP Unknown)   SpO2 97%   BMI 27.42  kg/m  Physical Exam  ED Results / Procedures / Treatments   Labs (all labs ordered are listed, but only abnormal results are displayed) Labs Reviewed - No data to display  EKG None  Radiology No results found.  Procedures Procedures  {Document cardiac monitor, telemetry assessment procedure when appropriate:1}  Medications Ordered in ED Medications - No data to display  ED Course/ Medical Decision Making/ A&P   {   Click here for ABCD2, HEART and other calculatorsREFRESH Note before signing :1}                              Medical Decision Making  ***  {Document critical care time when appropriate:1} {Document review of labs and clinical decision tools ie heart score, Chads2Vasc2 etc:1}  {Document your independent review of radiology images, and any outside records:1} {Document your discussion with family members, caretakers, and with consultants:1} {Document social determinants of health affecting pt's care:1} {Document your decision making why or why not admission, treatments were needed:1} Final Clinical Impression(s) / ED Diagnoses Final diagnoses:  None    Rx / DC Orders ED Discharge Orders     None

## 2022-10-05 NOTE — ED Notes (Signed)
Pt states unable to give urine sample a this time.
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
## 2022-10-05 NOTE — Discharge Instructions (Addendum)
You are seen today for arm pain.  Your x-ray was normal.  Your labs were reassuring.  I do recommend that you return tomorrow for an ultrasound to make sure that you do not have a blood clot in the area.  We have given you 1 dose of a blood thinner here but you may need a prescription for this.

## 2022-10-06 ENCOUNTER — Ambulatory Visit (HOSPITAL_COMMUNITY): Admission: RE | Admit: 2022-10-06 | Payer: Medicaid Other | Source: Ambulatory Visit

## 2022-10-06 ENCOUNTER — Encounter (HOSPITAL_COMMUNITY): Payer: Self-pay

## 2022-10-06 ENCOUNTER — Other Ambulatory Visit: Payer: Self-pay

## 2022-10-06 ENCOUNTER — Emergency Department (HOSPITAL_COMMUNITY)
Admission: EM | Admit: 2022-10-06 | Discharge: 2022-10-06 | Disposition: A | Discharge status: Home / Self Care | Payer: Medicaid Other | Attending: Emergency Medicine | Admitting: Emergency Medicine

## 2022-10-06 DIAGNOSIS — R200 Anesthesia of skin: Secondary | ICD-10-CM | POA: Diagnosis not present

## 2022-10-06 DIAGNOSIS — M79642 Pain in left hand: Secondary | ICD-10-CM | POA: Diagnosis present

## 2022-10-06 MED ORDER — KETOROLAC TROMETHAMINE 60 MG/2ML IM SOLN
30.0000 mg | Freq: Once | INTRAMUSCULAR | Status: AC
  Administered 2022-10-06: 30 mg via INTRAMUSCULAR

## 2022-10-06 MED ORDER — ACETAMINOPHEN 500 MG PO TABS: 1000.0000 mg | ORAL_TABLET | Freq: Once | ORAL | Status: DC

## 2022-10-06 MED ORDER — KETOROLAC TROMETHAMINE 30 MG/ML IJ SOLN
30.0000 mg | Freq: Once | INTRAMUSCULAR | Status: DC
  Filled 2022-10-06: qty 1

## 2022-10-06 NOTE — Discharge Instructions (Addendum)
Please go for your ultrasound tomorrow at 11.    I have also given you a telephone number for a hand doctor.  I would like you to call them to make an appointment for next week to assess for nerve injury.
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
## 2022-10-07 ENCOUNTER — Ambulatory Visit (HOSPITAL_COMMUNITY)
Admission: RE | Admit: 2022-10-07 | Discharge: 2022-10-07 | Disposition: A | Discharge status: Home / Self Care | Payer: Medicaid Other | Source: Ambulatory Visit | Attending: Emergency Medicine | Admitting: Emergency Medicine

## 2022-10-07 DIAGNOSIS — W269XXD Contact with unspecified sharp object(s), subsequent encounter: Secondary | ICD-10-CM | POA: Insufficient documentation

## 2022-10-07 DIAGNOSIS — M79602 Pain in left arm: Secondary | ICD-10-CM | POA: Diagnosis not present

## 2022-10-07 DIAGNOSIS — S51832D Puncture wound without foreign body of left forearm, subsequent encounter: Secondary | ICD-10-CM | POA: Diagnosis present

## 2022-10-07 DIAGNOSIS — M79632 Pain in left forearm: Secondary | ICD-10-CM | POA: Diagnosis present

## 2022-10-07 DIAGNOSIS — R202 Paresthesia of skin: Secondary | ICD-10-CM | POA: Insufficient documentation

## 2023-06-24 ENCOUNTER — Emergency Department (HOSPITAL_COMMUNITY)
Admission: EM | Admit: 2023-06-24 | Discharge: 2023-06-24 | Disposition: A | Discharge status: Home / Self Care | Attending: Emergency Medicine | Admitting: Emergency Medicine

## 2023-06-24 ENCOUNTER — Other Ambulatory Visit: Payer: Self-pay

## 2023-06-24 ENCOUNTER — Encounter (HOSPITAL_COMMUNITY): Payer: Self-pay

## 2023-06-24 ENCOUNTER — Emergency Department (HOSPITAL_COMMUNITY)

## 2023-06-24 DIAGNOSIS — R079 Chest pain, unspecified: Secondary | ICD-10-CM | POA: Insufficient documentation

## 2023-06-24 DIAGNOSIS — S0993XA Unspecified injury of face, initial encounter: Secondary | ICD-10-CM | POA: Diagnosis present

## 2023-06-24 DIAGNOSIS — S0990XA Unspecified injury of head, initial encounter: Secondary | ICD-10-CM | POA: Insufficient documentation

## 2023-06-24 DIAGNOSIS — S01512A Laceration without foreign body of oral cavity, initial encounter: Secondary | ICD-10-CM | POA: Diagnosis not present

## 2023-06-24 LAB — CBC
HCT: 39.3 % (ref 36.0–46.0)
Hemoglobin: 13.1 g/dL (ref 12.0–15.0)
MCH: 31.4 pg (ref 26.0–34.0)
MCHC: 33.3 g/dL (ref 30.0–36.0)
MCV: 94.2 fL (ref 80.0–100.0)
Platelets: 247 10*3/uL (ref 150–400)
RBC: 4.17 MIL/uL (ref 3.87–5.11)
RDW: 12.7 % (ref 11.5–15.5)
WBC: 9.2 10*3/uL (ref 4.0–10.5)
nRBC: 0 % (ref 0.0–0.2)

## 2023-06-24 LAB — BASIC METABOLIC PANEL WITH GFR
Anion gap: 10 (ref 5–15)
BUN: 14 mg/dL (ref 6–20)
CO2: 21 mmol/L — ABNORMAL LOW (ref 22–32)
Calcium: 9 mg/dL (ref 8.9–10.3)
Chloride: 109 mmol/L (ref 98–111)
Creatinine, Ser: 0.99 mg/dL (ref 0.44–1.00)
GFR, Estimated: >60 mL/min (ref >60)
Glucose, Bld: 121 mg/dL — ABNORMAL HIGH (ref 70–99)
Potassium: 3.9 mmol/L (ref 3.5–5.1)
Sodium: 140 mmol/L (ref 135–145)

## 2023-06-24 LAB — HCG, SERUM, QUALITATIVE: Preg, Serum: NEGATIVE

## 2023-06-24 MED ORDER — FENTANYL CITRATE PF 50 MCG/ML IJ SOSY
50.0000 ug | PREFILLED_SYRINGE | Freq: Once | INTRAMUSCULAR | Status: AC
  Administered 2023-06-24: 50 ug via INTRAVENOUS

## 2023-06-24 MED ORDER — FENTANYL CITRATE PF 50 MCG/ML IJ SOSY
PREFILLED_SYRINGE | INTRAMUSCULAR | Status: AC
  Filled 2023-06-24: qty 1

## 2023-06-24 MED ORDER — OXYCODONE-ACETAMINOPHEN 5-325 MG PO TABS
1.0000 | ORAL_TABLET | Freq: Once | ORAL | Status: AC
  Administered 2023-06-24: 1 via ORAL
  Filled 2023-06-24 (×2): qty 1

## 2023-06-24 MED ORDER — AMOXICILLIN 500 MG PO CAPS: 500.0000 mg | ORAL_CAPSULE | Freq: Three times a day (TID) | ORAL | 0 refills | Status: DC

## 2023-06-24 NOTE — Discharge Instructions (Addendum)
 Rinse out your mouth with salt water frequently, especially after you eat.  Take Motrin  or Tylenol  as needed for pain.
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
## 2023-06-24 NOTE — ED Notes (Signed)
 Patient refusing to get up at this time, states she is afraid to go home, social work, provider, and Press photographer notified.
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
## 2023-06-24 NOTE — ED Notes (Signed)
 Patient has been sleeping, did not administer pain medication when ordered.

## 2023-06-24 NOTE — ED Notes (Signed)
 Social work at bedside.

## 2023-12-27 ENCOUNTER — Emergency Department (HOSPITAL_COMMUNITY)
Admission: EM | Admit: 2023-12-27 | Discharge: 2023-12-28 | Disposition: A | Discharge status: Home / Self Care | Attending: Emergency Medicine | Admitting: Emergency Medicine

## 2023-12-27 ENCOUNTER — Encounter (HOSPITAL_COMMUNITY): Payer: Self-pay | Admitting: Radiology

## 2023-12-27 ENCOUNTER — Other Ambulatory Visit: Payer: Self-pay

## 2023-12-27 DIAGNOSIS — J069 Acute upper respiratory infection, unspecified: Secondary | ICD-10-CM | POA: Diagnosis not present

## 2023-12-27 DIAGNOSIS — B9689 Other specified bacterial agents as the cause of diseases classified elsewhere: Secondary | ICD-10-CM | POA: Insufficient documentation

## 2023-12-27 DIAGNOSIS — H5789 Other specified disorders of eye and adnexa: Secondary | ICD-10-CM | POA: Diagnosis present

## 2023-12-27 DIAGNOSIS — H1033 Unspecified acute conjunctivitis, bilateral: Secondary | ICD-10-CM | POA: Insufficient documentation

## 2023-12-27 NOTE — ED Triage Notes (Signed)
 Patient here with sinus infection and bilateral eye burning. Patient is hysterical and we cannot get much information out of her. Her friend states that she had green discharge coming out of her eyes

## 2023-12-27 NOTE — ED Triage Notes (Signed)
 Pt denies getting anything in her eyes. Patient states the wind has been burning her eyes. She has tried sinus medication at work.

## 2023-12-28 ENCOUNTER — Emergency Department (HOSPITAL_COMMUNITY)

## 2023-12-28 LAB — RESP PANEL BY RT-PCR (RSV, FLU A&B, COVID)  RVPGX2
Influenza A by PCR: NEGATIVE
Influenza B by PCR: NEGATIVE
Resp Syncytial Virus by PCR: NEGATIVE
SARS Coronavirus 2 by RT PCR: NEGATIVE

## 2023-12-28 MED ORDER — BENZONATATE 100 MG PO CAPS
100.0000 mg | ORAL_CAPSULE | Freq: Three times a day (TID) | ORAL | 0 refills | Status: AC | PRN
Start: 2023-12-28 — End: ?

## 2023-12-28 MED ORDER — ERYTHROMYCIN 5 MG/GM OP OINT: TOPICAL_OINTMENT | OPHTHALMIC | 0 refills | Status: AC

## 2023-12-28 MED ORDER — FLUORESCEIN SODIUM 1 MG OP STRP
1.0000 | ORAL_STRIP | Freq: Once | OPHTHALMIC | Status: AC
  Administered 2023-12-28: 1 via OPHTHALMIC
  Filled 2023-12-28: qty 1

## 2023-12-28 MED ORDER — AZITHROMYCIN 250 MG PO TABS
250.0000 mg | ORAL_TABLET | Freq: Every day | ORAL | 0 refills | Status: AC
Start: 2023-12-28 — End: ?

## 2023-12-28 MED ORDER — DOXYCYCLINE HYCLATE 100 MG PO TABS
100.0000 mg | ORAL_TABLET | Freq: Once | ORAL | Status: AC
  Administered 2023-12-28: 100 mg via ORAL
  Filled 2023-12-28: qty 1

## 2023-12-28 MED ORDER — AZITHROMYCIN 250 MG PO TABS
500.0000 mg | ORAL_TABLET | Freq: Once | ORAL | Status: AC
  Administered 2023-12-28: 500 mg via ORAL
  Filled 2023-12-28: qty 2

## 2023-12-28 MED ORDER — ERYTHROMYCIN 5 MG/GM OP OINT
1.0000 | TOPICAL_OINTMENT | Freq: Once | OPHTHALMIC | Status: AC
  Administered 2023-12-28: 1 via OPHTHALMIC
  Filled 2023-12-28: qty 3.5

## 2023-12-28 MED ORDER — DOXYCYCLINE HYCLATE 100 MG PO CAPS: 100.0000 mg | ORAL_CAPSULE | Freq: Two times a day (BID) | ORAL | 0 refills | Status: AC

## 2023-12-28 MED ORDER — TETRACAINE HCL 0.5 % OP SOLN
2.0000 [drp] | Freq: Once | OPHTHALMIC | Status: AC
  Administered 2023-12-28: 2 [drp] via OPHTHALMIC
  Filled 2023-12-28: qty 4

## 2023-12-28 NOTE — ED Notes (Signed)
 Lab contacted regarding delayed results for respiratory panel sent down at 0830. Was told it would be 15 more minutes until it resulted.

## 2023-12-28 NOTE — ED Provider Notes (Signed)
 [place] EMERGENCY DEPARTMENT AT [facility] Provider Note   CSN: [id number] Arrival date & time: 12/27/23  2244     Patient presents with: Eye Drainage   Nicole Mahoney is a 43 y.o. female who presents to the emergency department with a chief complaint of bilateral eye itchiness, pain, as well as green eye discharge.  Patient states that she currently has an upper respiratory tract infection and has had a cough as well as a sore throat as well as rhinorrhea. Patient does appreciate slightly blurry vision specifically in her left eye but otherwise does not appreciate any acute visual disturbances. Patient denies fever or chills. Denies chest pain or shortness of breath. Patient denies having a PCP or optho provider. Past medical history significant for cocaine  abuse, psychoactive substance-induced mood disorder, major depressive disorder, penicillin allergy, hypertension, etc. Patient states that this has happened before and she improved with antibiotics and eyedrops.    HPI     Prior to Admission medications   Medication Sig Start Date End Date Taking? Authorizing Provider  azithromycin  (ZITHROMAX ) 250 MG tablet Take 1 tablet (250 mg total) by mouth daily. Take 1 tablet daily until you complete the course 12/28/23  Yes Brynlea Spindler F, PA-C  benzonatate  (TESSALON ) 100 MG capsule Take 1 capsule (100 mg total) by mouth 3 (three) times daily as needed for cough (as needed for cough). 12/28/23  Yes Naomee Nowland F, PA-C  doxycycline  (VIBRAMYCIN ) 100 MG capsule Take 1 capsule (100 mg total) by mouth 2 (two) times daily for 7 days. 12/28/23 01/04/24 Yes Devarius Nelles F, PA-C  erythromycin  ophthalmic ointment Place a 1/2 inch ribbon of ointment into the lower eyelid. 12/28/23  Yes Elya Tarquinio F, PA-C  acetaminophen  (TYLENOL ) 650 MG CR tablet Take 650 mg by mouth every 8 (eight) hours as needed for pain.    [provider]  methylPREDNISolone  (MEDROL  DOSEPAK) 4 MG  TBPK tablet Use as directed 04/29/21   Harris, Abigail, PA-C  naproxen  (NAPROSYN ) 375 MG tablet Take 1 tablet (375 mg total) by mouth 2 (two) times daily with a meal. 04/29/21   Harris, Abigail, PA-C  ondansetron  (ZOFRAN  ODT) 4 MG disintegrating tablet Take 1 tablet (4 mg total) by mouth every 8 (eight) hours as needed for nausea or vomiting. 03/25/22   Blue, Soijett A, PA-C  prednisoLONE  acetate (PRED FORTE ) 1 % ophthalmic suspension Place 1 drop into the left eye 4 (four) times daily. 03/29/18   Odis Burnard Jansky, PA-C    Allergies: Penicillins, Penicillins, and Percocet [oxycodone -acetaminophen ]    Review of Systems  Eyes:  Positive for pain, discharge, redness and itching.    Updated Vital Signs BP 115/80   Pulse 65   Temp 98.2 F (36.8 C) (Oral)   Resp 16   Ht 5' 10 (1.778 m)   Wt 90.7 kg   SpO2 100%   BMI 28.70 kg/m   Physical Exam Vitals and nursing note reviewed.  Constitutional:      General: She is awake. She is not in acute distress.    Appearance: Normal appearance. She is not toxic-appearing or diaphoretic.  HENT:     Head: Normocephalic and atraumatic.     Nose:     Right Sinus: Maxillary sinus tenderness present.     Left Sinus: Maxillary sinus tenderness present.  Eyes:     General: Lids are normal. Lids are everted, no foreign bodies appreciated. Gaze aligned appropriately. No scleral icterus.  Right eye: Discharge present. No foreign body.        Left eye: Discharge present.No foreign body.     Extraocular Movements: Extraocular movements intact.     Right eye: Normal extraocular motion.     Left eye: Normal extraocular motion.     Pupils: Pupils are equal, round, and reactive to light.     Comments: No surrounding orbital or periorbital cellulitis  Cardiovascular:     Rate and Rhythm: Normal rate and regular rhythm.  Pulmonary:     Effort: Pulmonary effort is normal. No respiratory distress.     Breath sounds: No wheezing, rhonchi or rales.      Comments: Patient talking in full sentences on room air Musculoskeletal:        General: Normal range of motion.     Cervical back: Normal range of motion.     Right lower leg: No edema.     Left lower leg: No edema.     Comments: Grossly normal ROM of all 4 extremities, patient ambulatory without assistance  Skin:    General: Skin is warm.     Capillary Refill: Capillary refill takes less than 2 seconds.  Neurological:     General: No focal deficit present.     Mental Status: She is alert and oriented to person, place, and time.  Psychiatric:        Mood and Affect: Mood normal.        Behavior: Behavior normal. Behavior is cooperative.     (all labs ordered are listed, but only abnormal results are displayed) Labs Reviewed  RESP PANEL BY RT-PCR (RSV, FLU A&B, COVID)  [id number]    EKG: None  Radiology: DG Chest 2 View Result Date: 12/28/2023 CLINICAL DATA:  productive cough EXAM: DG CHEST 2V COMPARISON:  Jun 24, 2023 FINDINGS: The cardiomediastinal silhouette is normal in contour. No pleural effusion. No pneumothorax. There is faint reticular nodularity along the RIGHT lung base. Visualized abdomen is unremarkable. No acute osseous abnormality noted. IMPRESSION: Faint reticular nodularity along the RIGHT lung base may reflect atelectasis versus developing infection. Electronically Signed   By: Corean Salter M.D.   On: 12/28/2023 10:53     Procedures   Medications Ordered in the ED  fluorescein  ophthalmic strip 1 strip (1 strip Both Eyes Given 12/28/23 0930)  tetracaine  (PONTOCAINE) 0.5 % ophthalmic solution 2 drop (2 drops Both Eyes Given 12/28/23 0930)  erythromycin  ophthalmic ointment 1 Application (1 Application Both Eyes Given 12/28/23 1251)  doxycycline  (VIBRA -TABS) tablet 100 mg (100 mg Oral Given 12/28/23 1251)  azithromycin  (ZITHROMAX ) tablet 500 mg (500 mg Oral Given 12/28/23 1251)                                    Medical Decision Making Amount and/or  Complexity of Data Reviewed Radiology: ordered.  Risk Prescription drug management.   Patient presents to the ED for concern of eye pain/itchiness, this involves an extensive number of treatment options, and is a complaint that carries with it a high risk of complications and morbidity.  The differential diagnosis includes conjunctivitis, foreign body, corneal abrasion, glaucoma, etc.   Co morbidities that complicate the patient evaluation  cocaine  abuse, psychoactive substance-induced mood disorder, major depressive disorder, penicillin allergy, hypertension   Lab Tests:  I Ordered, and personally interpreted labs.  The pertinent results include:  respiratory panel negative   Imaging Studies ordered:  I  ordered imaging studies including chest xray I independently visualized and interpreted imaging which showed atelectasis vs. Developing infection at the base of the right lung I agree with the radiologist interpretation   Medicines ordered and prescription drug management:  I ordered medication including tetracaine  drops, staining strip, tonopen for eye evaluation  Reevaluation of the patient after these medicines showed that the patient improved I have reviewed the patients home medicines and have made adjustments as needed   Test Considered:  CBC, CMP, UA: declined at this time as I do not believe these would change management as I suspect an acute viral or bacterial URI-like process as well as bacterial conjunctivitis Head imaging: declined as patients visual symptoms went back to baseline with tetracaine  drops, believe that visual disturbances earlier were due to pain as opposed to other pathologic process   Critical Interventions:  none   Problem List / ED Course:  43 yof, vital signs stable, bilateral eye pain, itchiness, as well as green discharge per patient, patient also has URI symptoms, no fever On physical exam, patient is well-appearing however continues  to report reduced visual acuity, poor historian and difficult to obtain if this is different from baseline. No visual field deficit of the R eye, however patient states she cannot read anything because it is blurry out of the L eye, patient also reported to nursing that she could not see anything on visual acuity. Patient was able to report to myself how many fingers I was holding up in all visual field quadrants of the R eye With the eye pain, itchiness, and discharge in the setting of most likely a URI suspicious for bacterial vs. Viral conjunctivitis Will stain eye and obtain pressures for further evaluation Patient symptoms resolved with timolol drops, states she no longer has pain, eye staining reassuring with no dendritic lesions, signs of abrasion or ulcer, patient vision also presumably back to baseline as patient now has no no blurry vision in L eye and all visual fields are intact Pressures with tonopen 25 on the L and 16 on the R  CXR obtained due to productive cough, suspicious for URI and bacterial conjunctivitis, will treat with antibacterial drops, significant for atelectasis vs. Developing infection, will cover with oral abx Patient given first dose of oral antibiotics as well as azithromycin  ointment here in the ED and discharged with prescription for outpatient therapy Testing for flu, covid, and RSV all negative Return precautions given Patient discharged Most likely diagnosis at this time is URI leading to bacterial conjunctivitis and possible developing pneumonia, vital signs stable and patient not in respiratory distress, no oxygen  desaturation while in the ED   Reevaluation:  After the interventions noted above, I reevaluated the patient and found that they have :improved   Social Determinants of Health:  No PCP, substance abuse    Dispostion:  After consideration of the diagnostic results and the patients response to treatment, I feel that the patient would  benefit from discharge and outpatient therapy as described, completion of full antibiotic course.      Final diagnoses:  Acute bacterial conjunctivitis of both eyes  Upper respiratory tract infection, unspecified type    ED Discharge Orders          Ordered    azithromycin  (ZITHROMAX ) 250 MG tablet  Daily        12/28/23 1239    doxycycline  (VIBRAMYCIN ) 100 MG capsule  2 times daily        12/28/23 1239  erythromycin  ophthalmic ointment        12/28/23 1239    benzonatate  (TESSALON ) 100 MG capsule  3 times daily PRN        12/28/23 1239               Bailley Guilford F, PA-C 12/28/23 1740    Garrick Charleston, MD 12/29/23 1750

## 2023-12-28 NOTE — ED Notes (Signed)
 NT attempted to perform visual acuity test.  Pt reported she could not see any of the letters with both eyes and each eye individually.

## 2023-12-28 NOTE — Discharge Instructions (Signed)
 It was a pleasure taking care of you today.  Based on your history, physical exam, as well as labs and imaging I feel you are safe for discharge.  Today you have been prescribed a few medications.  You have been given erythromycin  ointment which is an ointment that is used to treat bacterial conjunctivitis which I believe is the reason you have eye irritation and eye drainage.  I have also sent in a prescription for doxycycline  as well as azithromycin , both of these are antibiotics which will help with your upper respiratory tract infection and possible developing pneumonia.  I have also sent in a medication called Tessalon  Perles which you can take as needed for cough.  If fever returns to recommend over-the-counter Tylenol  and/or ibuprofen .  Please continue to monitor your symptoms, if you develop any of the following symptoms including but not limited to chest pain, shortness of breath, worsening eye pain, visual disturbances, redness or rash surrounding eye, facial swelling or eye swelling, refractory fever, issues with balance or coordination, or other concerning symptom please return to the emergency department or seek further medical care.  I do recommend that you follow-up with your primary care provider as soon as possible and make them aware of your workup today and findings.  If symptoms worsen recommend follow-up within 48 hours.

## 2023-12-28 NOTE — ED Notes (Signed)
 Patient transported to X-ray

## 2024-01-16 ENCOUNTER — Encounter (HOSPITAL_COMMUNITY): Payer: Self-pay | Admitting: Radiology

## 2024-01-16 ENCOUNTER — Emergency Department (HOSPITAL_COMMUNITY)
Admission: EM | Admit: 2024-01-16 | Discharge: 2024-01-17 | Disposition: A | Attending: Emergency Medicine | Admitting: Emergency Medicine

## 2024-01-16 ENCOUNTER — Other Ambulatory Visit: Payer: Self-pay

## 2024-01-16 DIAGNOSIS — R059 Cough, unspecified: Secondary | ICD-10-CM | POA: Insufficient documentation

## 2024-01-16 DIAGNOSIS — R0602 Shortness of breath: Secondary | ICD-10-CM | POA: Insufficient documentation

## 2024-01-16 DIAGNOSIS — R079 Chest pain, unspecified: Secondary | ICD-10-CM | POA: Insufficient documentation

## 2024-01-16 DIAGNOSIS — R112 Nausea with vomiting, unspecified: Secondary | ICD-10-CM | POA: Insufficient documentation

## 2024-01-16 DIAGNOSIS — R0981 Nasal congestion: Secondary | ICD-10-CM | POA: Insufficient documentation

## 2024-01-16 DIAGNOSIS — R109 Unspecified abdominal pain: Secondary | ICD-10-CM | POA: Insufficient documentation

## 2024-01-16 DIAGNOSIS — R509 Fever, unspecified: Secondary | ICD-10-CM | POA: Insufficient documentation

## 2024-01-16 LAB — CBC
HCT: 37.5 % (ref 36.0–46.0)
Hemoglobin: 12.3 g/dL (ref 12.0–15.0)
MCH: 31.4 pg (ref 26.0–34.0)
MCHC: 32.8 g/dL (ref 30.0–36.0)
MCV: 95.7 fL (ref 80.0–100.0)
Platelets: 229 K/uL (ref 150–400)
RBC: 3.92 MIL/uL (ref 3.87–5.11)
RDW: 12.8 % (ref 11.5–15.5)
WBC: 5.8 K/uL (ref 4.0–10.5)
nRBC: 0 % (ref 0.0–0.2)

## 2024-01-16 LAB — BASIC METABOLIC PANEL WITH GFR
Anion gap: 4 — ABNORMAL LOW (ref 5–15)
BUN: 8 mg/dL (ref 6–20)
CO2: 29 mmol/L (ref 22–32)
Calcium: 8.3 mg/dL — ABNORMAL LOW (ref 8.9–10.3)
Chloride: 108 mmol/L (ref 98–111)
Creatinine, Ser: 0.76 mg/dL (ref 0.44–1.00)
GFR, Estimated: >60 mL/min (ref >60)
Glucose, Bld: 120 mg/dL — ABNORMAL HIGH (ref 70–99)
Potassium: 3.7 mmol/L (ref 3.5–5.1)
Sodium: 141 mmol/L (ref 135–145)

## 2024-01-16 LAB — TROPONIN I (HIGH SENSITIVITY): Troponin I (High Sensitivity): <2 ng/L (ref <18)

## 2024-01-16 NOTE — ED Triage Notes (Signed)
 She was here last week and placed on antibiotics for an infection in her lungs. She states she has completed her antibiotics but the cough remains and now it hurts her chest when she coughs. She endorses that she is unable to keep water down.

## 2024-01-17 ENCOUNTER — Emergency Department (HOSPITAL_COMMUNITY)

## 2024-01-17 LAB — RESP PANEL BY RT-PCR (RSV, FLU A&B, COVID)  RVPGX2
Influenza A by PCR: NEGATIVE
Influenza B by PCR: NEGATIVE
Resp Syncytial Virus by PCR: NEGATIVE
SARS Coronavirus 2 by RT PCR: NEGATIVE

## 2024-01-17 LAB — BRAIN NATRIURETIC PEPTIDE: B Natriuretic Peptide: 30.8 pg/mL (ref 0.0–100.0)

## 2024-01-17 LAB — HCG, SERUM, QUALITATIVE: Preg, Serum: NEGATIVE

## 2024-01-17 LAB — TROPONIN I (HIGH SENSITIVITY): Troponin I (High Sensitivity): <2 ng/L (ref <18)

## 2024-01-17 MED ORDER — MORPHINE SULFATE (PF) 4 MG/ML IV SOLN
4.0000 mg | Freq: Once | INTRAVENOUS | Status: AC
  Administered 2024-01-17: 4 mg via INTRAVENOUS
  Filled 2024-01-17: qty 1

## 2024-01-17 MED ORDER — ONDANSETRON HCL 4 MG/2ML IJ SOLN
4.0000 mg | Freq: Once | INTRAMUSCULAR | Status: AC
  Administered 2024-01-17: 4 mg via INTRAVENOUS
  Filled 2024-01-17: qty 2

## 2024-01-17 MED ORDER — IOHEXOL 350 MG/ML SOLN
80.0000 mL | Freq: Once | INTRAVENOUS | Status: AC | PRN
  Administered 2024-01-17: 80 mL via INTRAVENOUS

## 2024-01-17 MED ORDER — HYDROCODONE BIT-HOMATROP MBR 5-1.5 MG/5ML PO SOLN: 5.0000 mL | Freq: Four times a day (QID) | ORAL | 0 refills | Status: AC | PRN

## 2024-01-17 MED ORDER — ONDANSETRON HCL 4 MG PO TABS: 4.0000 mg | ORAL_TABLET | Freq: Four times a day (QID) | ORAL | 0 refills | Status: AC

## 2024-01-17 NOTE — ED Notes (Signed)
 Pt given sandwich bag and ginger ale to drink.

## 2024-01-17 NOTE — ED Provider Notes (Signed)
 [place] EMERGENCY DEPARTMENT AT [facility] Provider Note   CSN: [id number] Arrival date & time: 01/16/24  2243     History Chief Complaint  Patient presents with   Cough   Chest Pain    HPI Nicole Mahoney is a 43 y.o. female presenting for A myriad of symptoms including cough/congestion/abdominal pain/ nausea/vomitting/ chest pain.  SUBJECTIVE - Chief Complaint: Persistent symptoms after pneumonia diagnosis and antibiotics, unable to keep down water, ongoing cough, vomiting, abdominal pain, chest pain with cough, sob. - History of Present Illness: Diagnosed with pneumonia last week, completed antibiotics but continues to feel unwell. Reports inability to keep down water, frequent coughing, vomiting, abdominal pain, and chest pain when coughing. States breathing is not normal. Had a fever at the previous visit. Unsure of the names of antibiotics but reports having taken two different ones. No improvement with antibiotics.NBNB emesis x3 times per day.  Patient's recorded medical, surgical, social, medication list and allergies were reviewed in the Snapshot window as part of the initial history.   Review of Systems   Review of Systems  Constitutional:  Positive for fatigue. Negative for chills and fever.  HENT:  Negative for ear pain and sore throat.   Eyes:  Negative for pain and visual disturbance.  Respiratory:  Positive for cough and shortness of breath.   Cardiovascular:  Positive for chest pain. Negative for palpitations.  Gastrointestinal:  Positive for nausea and vomiting. Negative for abdominal pain.  Genitourinary:  Negative for dysuria and hematuria.  Musculoskeletal:  Negative for arthralgias and back pain.  Skin:  Negative for color change and rash.  Neurological:  Negative for seizures and syncope.  All other systems reviewed and are negative.   Physical Exam Updated Vital Signs BP 118/71   Pulse (!) 57   Temp 98.2 F (36.8 C) (Oral)   Resp 16    Ht 5' 10 (1.778 m)   Wt 90.7 kg   LMP 01/07/2024   SpO2 100%   BMI 28.69 kg/m  Physical Exam Vitals and nursing note reviewed.  Constitutional:      General: She is not in acute distress.    Appearance: She is well-developed.  HENT:     Head: Normocephalic and atraumatic.  Eyes:     Conjunctiva/sclera: Conjunctivae normal.  Cardiovascular:     Rate and Rhythm: Normal rate and regular rhythm.     Heart sounds: No murmur heard. Pulmonary:     Effort: Pulmonary effort is normal. No respiratory distress.     Breath sounds: Normal breath sounds.  Abdominal:     Palpations: Abdomen is soft.     Tenderness: There is no abdominal tenderness.  Musculoskeletal:        General: No swelling.     Cervical back: Neck supple.  Skin:    General: Skin is warm and dry.     Capillary Refill: Capillary refill takes less than 2 seconds.  Neurological:     Mental Status: She is alert.  Psychiatric:        Mood and Affect: Mood normal.      ED Course/ Medical Decision Making/ A&P    Procedures Procedures   Medications Ordered in ED Medications  morphine  (PF) 4 MG/ML injection 4 mg (has no administration in time range)  ondansetron  (ZOFRAN ) injection 4 mg (4 mg Intravenous Given 01/17/24 0320)  morphine  (PF) 4 MG/ML injection 4 mg (4 mg Intravenous Given 01/17/24 0322)  iohexol  (OMNIPAQUE ) 350 MG/ML injection 80 mL (  80 mLs Intravenous Contrast Given 01/17/24 0540)    Medical Decision Making:   43 year old female presenting with chief complaint detailed above. Very diffuse syndrome Her history of present illness and physical exam findings are most consistent with likely diffuse viral syndrome given the multisystem involvement.  However duration of syndrome is atypical tracking into 7 to 10 days. She is endorsing severe abdominal pain and chest pain which is new with this visit today.  She is in no acute distress on my evaluation. Patient's history of present illness physical exam  findings warrant a broad differential. Concerning her chest pain, it is likely musculoskeletal after substantial cough.  Would also consider ACS/heart failure exacerbation/PE on the differential given duration of symptoms. CT angiography of the chest ordered to further rule out PE or structural intrathoracic pathology.  BNP/troponin ordered to evaluate for ACS/heart failure exacerbation or other structural cardiac etiology. Will reassess after the studies Concerning her abdominal pain.  She is very tender diffusely throughout her abdomen.  Also likely musculoskeletal in the setting of nausea vomiting from her likely viral syndrome.  However again more tender in right lower quadrant that would be typically expected on exam.  Will evaluate broadly with CT abdomen pelvis follow-through as well as screening blood work including metabolic panel and lipase and plan for reassessment after the studies. History collected with assistance of family member at bedside. Reassessment: CTs with vague enteritis, also has vague lymphadenopathy likely bronchitis.  Suspect viral syndrome given diffuse nature syndrome.  Patient in no acute distress.  No acute pathology on lab work and vital signs remained stable.  Patient's symptoms are under control my reassessment patient feels comfortable discharge.  Clinical Impression:  1. Chest pain, unspecified type   2. Abdominal pain, unspecified abdominal location      Discharge   Final Clinical Impression(s) / ED Diagnoses Final diagnoses:  Chest pain, unspecified type  Abdominal pain, unspecified abdominal location    Rx / DC Orders ED Discharge Orders          Ordered    HYDROcodone  bit-homatropine (HYCODAN) 5-1.5 MG/5ML syrup  Every 6 hours PRN        01/17/24 0632    ondansetron  (ZOFRAN ) 4 MG tablet  Every 6 hours        01/17/24 9367              Jerral Meth, MD 01/17/24 [phone]

## 2024-01-20 ENCOUNTER — Encounter (HOSPITAL_COMMUNITY): Payer: Self-pay | Admitting: Emergency Medicine

## 2024-01-20 ENCOUNTER — Other Ambulatory Visit: Payer: Self-pay

## 2024-01-20 ENCOUNTER — Emergency Department (HOSPITAL_COMMUNITY): Admission: EM | Admit: 2024-01-20 | Discharge: 2024-01-21 | Disposition: A

## 2024-01-20 DIAGNOSIS — R0789 Other chest pain: Secondary | ICD-10-CM | POA: Diagnosis present

## 2024-01-20 NOTE — ED Triage Notes (Signed)
 Patient BIB [facility] from anterior chest wall pain that started after an argument with her spouse.  Patient recently sick with PNA.  Patient reports coughing makes chest pain worse. VSS.

## 2024-01-21 ENCOUNTER — Emergency Department (HOSPITAL_COMMUNITY)

## 2024-01-21 LAB — CBC
HCT: 37.9 % (ref 36.0–46.0)
Hemoglobin: 12.4 g/dL (ref 12.0–15.0)
MCH: 30.9 pg (ref 26.0–34.0)
MCHC: 32.7 g/dL (ref 30.0–36.0)
MCV: 94.5 fL (ref 80.0–100.0)
Platelets: 214 K/uL (ref 150–400)
RBC: 4.01 MIL/uL (ref 3.87–5.11)
RDW: 12.7 % (ref 11.5–15.5)
WBC: 7.7 K/uL (ref 4.0–10.5)
nRBC: 0 % (ref 0.0–0.2)

## 2024-01-21 LAB — BASIC METABOLIC PANEL WITH GFR
Anion gap: 12 (ref 5–15)
BUN: 11 mg/dL (ref 6–20)
CO2: 23 mmol/L (ref 22–32)
Calcium: 9.1 mg/dL (ref 8.9–10.3)
Chloride: 102 mmol/L (ref 98–111)
Creatinine, Ser: 0.9 mg/dL (ref 0.44–1.00)
GFR, Estimated: >60 mL/min (ref >60)
Glucose, Bld: 84 mg/dL (ref 70–99)
Potassium: 3.6 mmol/L (ref 3.5–5.1)
Sodium: 137 mmol/L (ref 135–145)

## 2024-01-21 LAB — HCG, SERUM, QUALITATIVE: Preg, Serum: NEGATIVE

## 2024-01-21 LAB — TROPONIN T, HIGH SENSITIVITY: Troponin T High Sensitivity: <15 ng/L (ref 0–19)

## 2024-01-21 NOTE — ED Provider Triage Note (Signed)
 Emergency Medicine Provider Triage Evaluation Note  Nicole Mahoney , a 43 y.o. female  was evaluated in triage.  Pt complains of cough.  Hasn't been able to get abx and cough meds.  Feeling worse.  Review of Systems  Positive: cough Negative:   Physical Exam  BP (!) 141/88   Pulse 80   Temp 98.2 F (36.8 C) (Oral)   Resp 20   Wt 91 kg   LMP 01/07/2024   SpO2 98%   BMI 28.79 kg/m  Gen:   Awake, no distress   Resp:  Normal effort  MSK:   Moves extremities without difficulty  Other:    Medical Decision Making  Medically screening exam initiated at 12:45 AM.  Appropriate orders placed.  Nicole Mahoney was informed that the remainder of the evaluation will be completed by another provider, this initial triage assessment does not replace that evaluation, and the importance of remaining in the ED until their evaluation is complete.     Vicky Charleston, PA-C 01/21/24 [phone]

## 2024-01-21 NOTE — ED Provider Notes (Signed)
 [place] EMERGENCY DEPARTMENT AT [facility] Provider Note   CSN: [id number] Arrival date & time: 01/20/24  2326     Patient presents with: Chest Pain   Nicole Mahoney is a 43 y.o. female.   43 year old female presents for evaluation of chest pain after she got an argument with her spouse.  States its mostly improved at this time.  States she was recently treated for pneumonia and finished her antibiotics.  She states the pain was in the center of her chest, nonradiating and not associated with shortness of breath or nausea.  She denies any other symptoms or concerns.   Chest Pain Associated symptoms: no abdominal pain, no back pain, no cough, no fever, no palpitations, no shortness of breath and no vomiting        Prior to Admission medications  Medication Sig Start Date End Date Taking? Authorizing Provider  acetaminophen  (TYLENOL ) 650 MG CR tablet Take 650 mg by mouth every 8 (eight) hours as needed for pain.    [provider]  azithromycin  (ZITHROMAX ) 250 MG tablet Take 1 tablet (250 mg total) by mouth daily. Take 1 tablet daily until you complete the course 12/28/23   Hinnant, Collin F, PA-C  benzonatate  (TESSALON ) 100 MG capsule Take 1 capsule (100 mg total) by mouth 3 (three) times daily as needed for cough (as needed for cough). 12/28/23   Hinnant, Collin F, PA-C  erythromycin  ophthalmic ointment Place a 1/2 inch ribbon of ointment into the lower eyelid. 12/28/23   Hinnant, Collin F, PA-C  HYDROcodone  bit-homatropine (HYCODAN) 5-1.5 MG/5ML syrup Take 5 mLs by mouth every 6 (six) hours as needed for cough. 01/17/24   Jerral Meth, MD  methylPREDNISolone  (MEDROL  DOSEPAK) 4 MG TBPK tablet Use as directed 04/29/21   Harris, Abigail, PA-C  naproxen  (NAPROSYN ) 375 MG tablet Take 1 tablet (375 mg total) by mouth 2 (two) times daily with a meal. 04/29/21   Harris, Abigail, PA-C  ondansetron  (ZOFRAN  ODT) 4 MG disintegrating tablet Take 1 tablet (4 mg total)  by mouth every 8 (eight) hours as needed for nausea or vomiting. 03/25/22   Blue, Soijett A, PA-C  ondansetron  (ZOFRAN ) 4 MG tablet Take 1 tablet (4 mg total) by mouth every 6 (six) hours. 01/17/24   Jerral Meth, MD  prednisoLONE  acetate (PRED FORTE ) 1 % ophthalmic suspension Place 1 drop into the left eye 4 (four) times daily. 03/29/18   Odis Burnard Jansky, PA-C    Allergies: Penicillins, Penicillins, and Percocet [oxycodone -acetaminophen ]    Review of Systems  Constitutional:  Negative for chills and fever.  HENT:  Negative for ear pain and sore throat.   Eyes:  Negative for pain and visual disturbance.  Respiratory:  Negative for cough and shortness of breath.   Cardiovascular:  Positive for chest pain. Negative for palpitations.  Gastrointestinal:  Negative for abdominal pain and vomiting.  Genitourinary:  Negative for dysuria and hematuria.  Musculoskeletal:  Negative for arthralgias and back pain.  Skin:  Negative for color change and rash.  Neurological:  Negative for seizures and syncope.  All other systems reviewed and are negative.   Updated Vital Signs BP 123/75 (BP Location: Right Arm)   Pulse 61   Temp 98.1 F (36.7 C)   Resp 18   Wt 91 kg   LMP 01/07/2024   SpO2 99%   BMI 28.79 kg/m   Physical Exam Vitals and nursing note reviewed.  Constitutional:      General: She is not  in acute distress.    Appearance: She is well-developed. She is not ill-appearing.  HENT:     Head: Normocephalic and atraumatic.  Eyes:     Conjunctiva/sclera: Conjunctivae normal.  Cardiovascular:     Rate and Rhythm: Normal rate and regular rhythm.     Heart sounds: No murmur heard. Pulmonary:     Effort: Pulmonary effort is normal. No respiratory distress.     Breath sounds: Normal breath sounds.  Chest:     Chest wall: Tenderness present.  Abdominal:     Palpations: Abdomen is soft.     Tenderness: There is no abdominal tenderness.  Musculoskeletal:        General: No  swelling.     Cervical back: Neck supple.  Skin:    General: Skin is warm and dry.     Capillary Refill: Capillary refill takes less than 2 seconds.  Neurological:     Mental Status: She is alert.  Psychiatric:        Mood and Affect: Mood normal.     (all labs ordered are listed, but only abnormal results are displayed) Labs Reviewed  BASIC METABOLIC PANEL WITH GFR  CBC  HCG, SERUM, QUALITATIVE  TROPONIN T, HIGH SENSITIVITY  TROPONIN T, HIGH SENSITIVITY    EKG: None  Radiology: DG Chest 2 View Result Date: 01/21/2024 EXAM: 2 VIEW(S) XRAY OF THE CHEST 01/21/2024 12:06:00 AM COMPARISON: 12/28/2023 CLINICAL HISTORY: chest pain FINDINGS: LUNGS AND PLEURA: Resolved right lower lobe airspace opacities. No focal pulmonary opacity. No pleural effusion. No pneumothorax. HEART AND MEDIASTINUM: No acute abnormality of the cardiac and mediastinal silhouettes. BONES AND SOFT TISSUES: No acute osseous abnormality. IMPRESSION: 1. Resolved right lower lobe airspace opacities. Electronically signed by: Dorethia Molt MD 01/21/2024 12:19 AM EST RP Workstation: [id number]     Procedures   Medications Ordered in the ED - No data to display                                  Medical Decision Making Patient's workup negative, troponin negative and vital stable.  Chest pain is reproducible on exam.  Advised Tylenol  Motrin  as needed for pain and close up with primary care doctor.  Advised to stay on her other medications as prescribed and otherwise return to the ER for new or worsening symptoms.  She was comfortable with the plan to be discharged home.  Problems Addressed: Musculoskeletal chest pain: acute illness or injury  Amount and/or Complexity of Data Reviewed External Data Reviewed: notes.    Details: Prior ED records reviewed and patient seen for chest pain on 01-16-2024 with negative workup Labs: ordered. Decision-making details documented in ED Course.    Details: Ordered and reviewed  by me and unremarkable, troponin negative Radiology: ordered and independent interpretation performed. Decision-making details documented in ED Course.    Details: Ordered and interpreted by me independently of radiology Chest x-ray shows resolution of pneumonia, no other acute abnormality ECG/medicine tests: ordered and independent interpretation performed. Decision-making details documented in ED Course.  Risk OTC drugs. Prescription drug management.     Final diagnoses:  Musculoskeletal chest pain    ED Discharge Orders     None          Gennaro Duwaine CROME, DO 01/21/24 [phone]

## 2024-01-21 NOTE — Discharge Instructions (Signed)
You can take Tylenol and Motrin as needed for pain.
